# Patient Record
Sex: Female | Born: 1940 | Race: Black or African American | Hispanic: No | Marital: Single | Smoking: Never smoker
Health system: Southern US, Community
[De-identification: ages and names within clinical notes are randomized; demographics above are authoritative.]

## PROBLEM LIST (undated history)

## (undated) DIAGNOSIS — K219 Gastro-esophageal reflux disease without esophagitis: Secondary | ICD-10-CM

## (undated) DIAGNOSIS — R350 Frequency of micturition: Secondary | ICD-10-CM

## (undated) DIAGNOSIS — R1310 Dysphagia, unspecified: Secondary | ICD-10-CM

## (undated) DIAGNOSIS — I10 Essential (primary) hypertension: Secondary | ICD-10-CM

## (undated) DIAGNOSIS — M545 Low back pain: Secondary | ICD-10-CM

## (undated) DIAGNOSIS — G8929 Other chronic pain: Secondary | ICD-10-CM

## (undated) DIAGNOSIS — M1990 Unspecified osteoarthritis, unspecified site: Secondary | ICD-10-CM

## (undated) DIAGNOSIS — E785 Hyperlipidemia, unspecified: Secondary | ICD-10-CM

## (undated) DIAGNOSIS — R030 Elevated blood-pressure reading, without diagnosis of hypertension: Secondary | ICD-10-CM

## (undated) DIAGNOSIS — G4700 Insomnia, unspecified: Secondary | ICD-10-CM

## (undated) DIAGNOSIS — D251 Intramural leiomyoma of uterus: Secondary | ICD-10-CM

## (undated) DIAGNOSIS — M549 Dorsalgia, unspecified: Secondary | ICD-10-CM

## (undated) HISTORY — DX: Elevated blood-pressure reading, without diagnosis of hypertension: R03.0

## (undated) HISTORY — DX: Hyperlipidemia, unspecified: E78.5

## (undated) HISTORY — DX: Other chronic pain: G89.29

## (undated) HISTORY — DX: Low back pain: M54.5

## (undated) HISTORY — DX: Dorsalgia, unspecified: M54.9

## (undated) HISTORY — DX: Frequency of micturition: R35.0

## (undated) HISTORY — DX: Gastro-esophageal reflux disease without esophagitis: K21.9

## (undated) HISTORY — DX: Intramural leiomyoma of uterus: D25.1

## (undated) HISTORY — DX: Insomnia, unspecified: G47.00

## (undated) HISTORY — PX: TUBAL LIGATION: SHX77

## (undated) HISTORY — DX: Dysphagia, unspecified: R13.10

---

## 2004-12-21 ENCOUNTER — Ambulatory Visit: Payer: Self-pay

## 2007-07-11 ENCOUNTER — Emergency Department: Payer: Self-pay | Admitting: Emergency Medicine

## 2007-07-11 ENCOUNTER — Other Ambulatory Visit: Payer: Self-pay

## 2008-07-01 ENCOUNTER — Ambulatory Visit: Payer: Self-pay | Admitting: Unknown Physician Specialty

## 2010-01-05 ENCOUNTER — Ambulatory Visit: Payer: Self-pay | Admitting: Family Medicine

## 2011-02-02 ENCOUNTER — Ambulatory Visit: Payer: Self-pay | Admitting: Family Medicine

## 2011-05-17 LAB — HM PAP SMEAR

## 2012-04-24 ENCOUNTER — Ambulatory Visit: Payer: Self-pay | Admitting: Family Medicine

## 2013-07-16 LAB — HM MAMMOGRAPHY

## 2013-07-22 ENCOUNTER — Ambulatory Visit: Payer: Self-pay | Admitting: Family Medicine

## 2013-08-02 ENCOUNTER — Ambulatory Visit: Payer: Self-pay | Admitting: Family Medicine

## 2014-02-12 ENCOUNTER — Ambulatory Visit: Payer: Self-pay | Admitting: Family Medicine

## 2014-04-01 ENCOUNTER — Ambulatory Visit: Payer: Self-pay | Admitting: Urology

## 2014-08-18 ENCOUNTER — Encounter: Payer: Self-pay | Admitting: Family Medicine

## 2014-10-19 DIAGNOSIS — M545 Low back pain, unspecified: Secondary | ICD-10-CM | POA: Insufficient documentation

## 2014-10-19 DIAGNOSIS — G8929 Other chronic pain: Secondary | ICD-10-CM

## 2014-10-19 DIAGNOSIS — Z789 Other specified health status: Secondary | ICD-10-CM | POA: Insufficient documentation

## 2014-10-19 DIAGNOSIS — K219 Gastro-esophageal reflux disease without esophagitis: Secondary | ICD-10-CM | POA: Insufficient documentation

## 2014-10-19 DIAGNOSIS — D251 Intramural leiomyoma of uterus: Secondary | ICD-10-CM

## 2014-10-19 DIAGNOSIS — G4700 Insomnia, unspecified: Secondary | ICD-10-CM

## 2014-10-19 DIAGNOSIS — R350 Frequency of micturition: Secondary | ICD-10-CM | POA: Insufficient documentation

## 2014-10-19 DIAGNOSIS — M549 Dorsalgia, unspecified: Secondary | ICD-10-CM

## 2014-10-19 DIAGNOSIS — R319 Hematuria, unspecified: Secondary | ICD-10-CM | POA: Insufficient documentation

## 2014-10-19 HISTORY — DX: Intramural leiomyoma of uterus: D25.1

## 2014-10-19 HISTORY — DX: Gastro-esophageal reflux disease without esophagitis: K21.9

## 2014-10-19 HISTORY — DX: Frequency of micturition: R35.0

## 2014-10-19 HISTORY — DX: Low back pain, unspecified: M54.50

## 2014-10-19 HISTORY — DX: Insomnia, unspecified: G47.00

## 2014-10-19 HISTORY — DX: Other chronic pain: G89.29

## 2014-10-19 HISTORY — DX: Dorsalgia, unspecified: M54.9

## 2014-10-20 ENCOUNTER — Telehealth: Payer: Self-pay | Admitting: Family Medicine

## 2014-10-20 ENCOUNTER — Ambulatory Visit (INDEPENDENT_AMBULATORY_CARE_PROVIDER_SITE_OTHER): Payer: Commercial Managed Care - HMO | Admitting: Family Medicine

## 2014-10-20 ENCOUNTER — Encounter: Payer: Self-pay | Admitting: Family Medicine

## 2014-10-20 VITALS — BP 145/85 | HR 62 | Temp 98.3°F | Resp 16 | Ht <= 58 in | Wt 171.8 lb

## 2014-10-20 DIAGNOSIS — IMO0001 Reserved for inherently not codable concepts without codable children: Secondary | ICD-10-CM

## 2014-10-20 DIAGNOSIS — R1310 Dysphagia, unspecified: Secondary | ICD-10-CM

## 2014-10-20 DIAGNOSIS — M549 Dorsalgia, unspecified: Secondary | ICD-10-CM

## 2014-10-20 DIAGNOSIS — K2100 Gastro-esophageal reflux disease with esophagitis, without bleeding: Secondary | ICD-10-CM

## 2014-10-20 DIAGNOSIS — R030 Elevated blood-pressure reading, without diagnosis of hypertension: Secondary | ICD-10-CM

## 2014-10-20 DIAGNOSIS — G8929 Other chronic pain: Secondary | ICD-10-CM | POA: Diagnosis not present

## 2014-10-20 HISTORY — DX: Dysphagia, unspecified: R13.10

## 2014-10-20 HISTORY — DX: Reserved for inherently not codable concepts without codable children: IMO0001

## 2014-10-20 MED ORDER — OMEPRAZOLE 40 MG PO CPDR: 40.0000 mg | DELAYED_RELEASE_CAPSULE | Freq: Every morning | ORAL | Status: DC

## 2014-10-20 NOTE — Progress Notes (Signed)
Name: Tracey Carroll   MRN: [id number]    DOB: [birth year]   Date:10/20/2014       Progress Note  Subjective  Chief Complaint  Chief Complaint  Patient presents with  . Gastrophageal Reflux    Unchanged  . Dysphagia    HPI   Hx. olf GERD.  Rouble swaowing solid foods for past 2-3 weeks.  Has to chew food more slowly and thoroughly.  Hx. Of chronic back pain.  Takes Ibuprofen about once a week./    No problem-specific assessment & plan notes found for this encounter.   Past Medical History  Diagnosis Date  . GERD (gastroesophageal reflux disease)     Past Surgical History  Procedure Laterality Date  . Tubal ligation      Family History  Problem Relation Age of Onset  . Diabetes Father     in her 78 and 31s  . Hypertension Daughter     her 58s    History   Social History  . Marital Status: Single    Spouse Name: N/A  . Number of Children: N/A  . Years of Education: N/A   Occupational History  . Not on file.   Social History Main Topics  . Smoking status: Never Smoker   . Smokeless tobacco: Never Used  . Alcohol Use: No  . Drug Use: No  . Sexual Activity: Not on file   Other Topics Concern  . Not on file   Social History Narrative     Current outpatient prescriptions:  .  omeprazole (PRILOSEC) 40 MG capsule, Take 1 capsule (40 mg total) by mouth every morning., Disp: 30 capsule, Rfl: 12  No Known Allergies   Review of Systems  Constitutional: Negative.   HENT: Negative.   Respiratory: Negative.   Cardiovascular: Negative.   Gastrointestinal: Positive for heartburn (reflux after eating at times). Negative for nausea, vomiting and melena.  Musculoskeletal: Positive for back pain (chronic).  Skin: Negative.   Neurological: Negative.       Objective  Filed Vitals:   10/20/14 0847 10/20/14 0921  BP: 144/74 145/85  Pulse: 62   Temp: 98.3 F (36.8 C)   Resp: 16   Height: 4\' 9"  (1.448 m)   Weight: 171 lb 12.8 oz (77.928 kg)      Physical Exam  Constitutional: She is well-developed, well-nourished, and in no distress.  HENT:  Mouth/Throat: Oropharynx is clear and moist.  Eyes: Conjunctivae and EOM are normal. Pupils are equal, round, and reactive to light.  Neck: Normal range of motion. Neck supple. No thyromegaly present.  Cardiovascular: Normal rate, regular rhythm, normal heart sounds and intact distal pulses.  Exam reveals no gallop and no friction rub.   No murmur heard. Pulmonary/Chest: Effort normal and breath sounds normal. No respiratory distress. She has no wheezes. She has no rales.  Abdominal: Soft. She exhibits no distension and no mass. There is no tenderness. There is no rebound and no guarding.  Musculoskeletal: Normal range of motion. She exhibits no edema.  Lymphadenopathy:    She has no cervical adenopathy.       No results found for this or any previous visit (from the past 2160 hour(s)).   Assessment & Plan  Problem List Items Addressed This Visit      Digestive   Acid reflux - Primary   Relevant Medications   omeprazole (PRILOSEC) 40 MG capsule   Dysphagia   Relevant Orders   Ambulatory referral to Gastroenterology  Other   Back pain, chronic   Elevated BP     .diagmeds Meds ordered this encounter  Medications  . omeprazole (PRILOSEC) 40 MG capsule    Sig: Take 1 capsule (40 mg total) by mouth every morning.    Dispense:  30 capsule    Refill:  12    1. Gastroesophageal reflux disease with esophagitis Continue Omeprazole  2. Dysphagia  Refer Dr. Allen Norris at [facility]. 3. Back pain, chronic Discontinue Ibuprofen 4. Elevated BP Will follow at subsequent visit.  Reduce \\salt  in diet.   Meds ordered this encounter  Medications  . omeprazole (PRILOSEC) 40 MG capsule    Sig: Take 1 capsule (40 mg total) by mouth every morning.    Dispense:  30 capsule    Refill:  12

## 2014-10-22 ENCOUNTER — Encounter: Payer: Self-pay | Admitting: Family Medicine

## 2014-10-22 ENCOUNTER — Encounter: Payer: Self-pay | Admitting: Gastroenterology

## 2014-10-28 ENCOUNTER — Ambulatory Visit: Payer: Commercial Managed Care - HMO | Admitting: Urgent Care

## 2014-10-30 ENCOUNTER — Ambulatory Visit (INDEPENDENT_AMBULATORY_CARE_PROVIDER_SITE_OTHER): Payer: Commercial Managed Care - HMO | Admitting: Gastroenterology

## 2014-10-30 ENCOUNTER — Other Ambulatory Visit: Payer: Self-pay

## 2014-10-30 VITALS — BP 159/92 | HR 68 | Temp 98.4°F | Ht <= 58 in | Wt 172.0 lb

## 2014-10-30 DIAGNOSIS — R1310 Dysphagia, unspecified: Secondary | ICD-10-CM | POA: Diagnosis not present

## 2014-10-30 NOTE — Progress Notes (Signed)
Gastroenterology Consultation  Referring Provider:     Arlis Porta., MD Primary Care Physician:  Dicky Doe, MD Primary Gastroenterologist:  Dr. Allen Norris     Reason for Consultation:     Dysphagia        HPI:   Tracey Carroll is a 74 y.o. y/o female referred for consultation & management of dysphagia by Dr. Dicky Doe, MD.  This patient comes in today with a report of dysphagia. The patient states that her dysphagia started a few weeks ago. The symptoms happen more with solids than a day with liquids. The patient had had this in the past and had an upper endoscopy that appears to be having been done by Dr. Vira Agar. The patient states that she has lost weight since this has started. She is not sure how much weight he has been lost but she states that her close are fitting looser than they had been previously. No report of any black stools or bloody stools she also denies any change in bowel habits.  Past Medical History  Diagnosis Date  . GERD (gastroesophageal reflux disease)   . Acid reflux 10/19/2014  . Dysphagia 10/20/2014  . 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 Fibroids, intramural 10/19/2014  . Back pain, chronic 10/19/2014  . LBP (low back pain) 10/19/2014  . Elevated BP 10/20/2014  . FOM (frequency of micturition) 10/19/2014    Past Surgical History  Procedure Laterality Date  . Tubal ligation      Prior to Admission medications   Medication Sig Start Date End Date Taking? Authorizing Provider  omeprazole (PRILOSEC) 40 MG capsule Take 1 capsule (40 mg total) by mouth every morning. 10/20/14 04/26/16 Yes Arlis Porta., MD  ibuprofen (ADVIL,MOTRIN) 600 MG tablet Take 600 mg by mouth every 6 (six) hours as needed.    Historical Provider, MD  omeprazole (PRILOSEC) 40 MG capsule Take 40 mg by mouth daily.    Historical Provider, MD    Family History  Problem Relation Age of Onset  . Diabetes Father     in her 44 and 18s  . Hypertension Daughter     her 36s      History  Substance Use Topics  . Smoking status: Never Smoker   . Smokeless tobacco: Never Used  . Alcohol Use: No    Allergies as of 10/30/2014  . (No Known Allergies)    Review of Systems:    All systems reviewed and negative except where noted in HPI.   Physical Exam:  BP 159/92 mmHg  Pulse 68  Temp(Src) 98.4 F (36.9 C) (Oral)  Ht 4\' 10"  (1.473 m)  Wt 172 lb (78.019 kg)  BMI 35.96 kg/m2 No LMP recorded. Patient is postmenopausal. Psych:  Alert and cooperative. Normal mood and affect. General:   Alert,  Well-developed, well-nourished, pleasant and cooperative in NAD Head:  Normocephalic and atraumatic. Eyes:  Sclera clear, no icterus.   Conjunctiva pink. Ears:  Normal auditory acuity. Nose:  No deformity, discharge, or lesions. Mouth:  No deformity or lesions,oropharynx pink & moist. Neck:  Supple; no masses or thyromegaly. Lungs:  Respirations even and unlabored.  Clear throughout to auscultation.   No wheezes, crackles, or rhonchi. No acute distress. Heart:  Regular rate and rhythm; no murmurs, clicks, rubs, or gallops. Abdomen:  Normal bowel sounds.  No bruits.  Soft, non-tender and non-distended without masses, hepatosplenomegaly or hernias noted.  No guarding or rebound tenderness.  Negative Carnett sign.  Rectal:  Deferred.  Msk:  Symmetrical without gross deformities.  Good, equal movement & strength bilaterally. Pulses:  Normal pulses noted. Extremities:  No clubbing or edema.  No cyanosis. Neurologic:  Alert and oriented x3;  grossly normal neurologically. Skin:  Intact without significant lesions or rashes.  No jaundice. Lymph Nodes:  No significant cervical adenopathy. Psych:  Alert and cooperative. Normal mood and affect.  Imaging Studies: No results found.  Assessment and Plan:   Tracey Carroll is a 74 y.o. y/o female dysphasia who will be set up for an upper endoscopy to evaluate her esophagus for possible strictures or narrowing versus  neoplasia. The patient may need esophageal dilation earache.I have discussed risks & benefits which include, but are not limited to, bleeding, infection, perforation & drug reaction.  The patient agrees with this plan & written consent will be obtained.

## 2014-11-12 ENCOUNTER — Encounter: Payer: Self-pay | Admitting: *Deleted

## 2014-11-19 NOTE — Discharge Instructions (Signed)

## 2014-11-20 ENCOUNTER — Ambulatory Visit: Payer: Commercial Managed Care - HMO | Admitting: Anesthesiology

## 2014-11-20 ENCOUNTER — Encounter
Admission: RE | Disposition: A | Discharge status: Home / Self Care | Payer: Self-pay | Source: Ambulatory Visit | Attending: Gastroenterology

## 2014-11-20 ENCOUNTER — Other Ambulatory Visit: Payer: Self-pay | Admitting: Gastroenterology

## 2014-11-20 ENCOUNTER — Ambulatory Visit
Admission: RE | Admit: 2014-11-20 | Discharge: 2014-11-20 | Disposition: A | Discharge status: Home / Self Care | Payer: Commercial Managed Care - HMO | Source: Ambulatory Visit | Attending: Gastroenterology | Admitting: Gastroenterology

## 2014-11-20 DIAGNOSIS — K219 Gastro-esophageal reflux disease without esophagitis: Secondary | ICD-10-CM | POA: Insufficient documentation

## 2014-11-20 DIAGNOSIS — Z79899 Other long term (current) drug therapy: Secondary | ICD-10-CM | POA: Diagnosis not present

## 2014-11-20 DIAGNOSIS — R1310 Dysphagia, unspecified: Secondary | ICD-10-CM | POA: Insufficient documentation

## 2014-11-20 DIAGNOSIS — K449 Diaphragmatic hernia without obstruction or gangrene: Secondary | ICD-10-CM | POA: Insufficient documentation

## 2014-11-20 DIAGNOSIS — K222 Esophageal obstruction: Secondary | ICD-10-CM | POA: Insufficient documentation

## 2014-11-20 DIAGNOSIS — R030 Elevated blood-pressure reading, without diagnosis of hypertension: Secondary | ICD-10-CM | POA: Insufficient documentation

## 2014-11-20 DIAGNOSIS — R69 Illness, unspecified: Secondary | ICD-10-CM | POA: Diagnosis not present

## 2014-11-20 DIAGNOSIS — Z791 Long term (current) use of non-steroidal anti-inflammatories (NSAID): Secondary | ICD-10-CM | POA: Insufficient documentation

## 2014-11-20 HISTORY — PX: ESOPHAGOGASTRODUODENOSCOPY (EGD) WITH PROPOFOL: SHX5813

## 2014-11-20 HISTORY — DX: Unspecified osteoarthritis, unspecified site: M19.90

## 2014-11-20 SURGERY — ESOPHAGOGASTRODUODENOSCOPY (EGD) WITH PROPOFOL
Anesthesia: Monitor Anesthesia Care | Wound class: Clean Contaminated

## 2014-11-20 MED ORDER — OXYCODONE HCL 5 MG/5ML PO SOLN: 5.0000 mg | Freq: Once | ORAL | Status: DC | PRN

## 2014-11-20 MED ORDER — LABETALOL HCL 5 MG/ML IV SOLN
INTRAVENOUS | Status: DC | PRN
  Administered 2014-11-20: 5 mg via INTRAVENOUS

## 2014-11-20 MED ORDER — OXYCODONE HCL 5 MG PO TABS: 5.0000 mg | ORAL_TABLET | Freq: Once | ORAL | Status: DC | PRN

## 2014-11-20 MED ORDER — SODIUM CHLORIDE 0.9 % IV SOLN: INTRAVENOUS | Status: DC

## 2014-11-20 MED ORDER — ACETAMINOPHEN 160 MG/5ML PO SOLN: 325.0000 mg | ORAL | Status: DC | PRN

## 2014-11-20 MED ORDER — GLYCOPYRROLATE 0.2 MG/ML IJ SOLN
INTRAMUSCULAR | Status: DC | PRN
  Administered 2014-11-20: .1 mg via INTRAVENOUS

## 2014-11-20 MED ORDER — FENTANYL CITRATE (PF) 100 MCG/2ML IJ SOLN: 25.0000 ug | INTRAMUSCULAR | Status: DC | PRN

## 2014-11-20 MED ORDER — ACETAMINOPHEN 325 MG PO TABS: 325.0000 mg | ORAL_TABLET | ORAL | Status: DC | PRN

## 2014-11-20 MED ORDER — LIDOCAINE HCL (CARDIAC) 20 MG/ML IV SOLN
INTRAVENOUS | Status: DC | PRN
  Administered 2014-11-20: 50 mg via INTRAVENOUS

## 2014-11-20 MED ORDER — LACTATED RINGERS IV SOLN
INTRAVENOUS | Status: DC
  Administered 2014-11-20: 07:00:00 via INTRAVENOUS

## 2014-11-20 MED ORDER — DEXAMETHASONE SODIUM PHOSPHATE 4 MG/ML IJ SOLN: 8.0000 mg | Freq: Once | INTRAMUSCULAR | Status: DC | PRN

## 2014-11-20 MED ORDER — PROPOFOL 10 MG/ML IV BOLUS
INTRAVENOUS | Status: DC | PRN
  Administered 2014-11-20: 50 mg via INTRAVENOUS
  Administered 2014-11-20 (×2): 40 mg via INTRAVENOUS

## 2014-11-20 SURGICAL SUPPLY — 39 items

## 2014-11-20 NOTE — H&P (Signed)
  [facility]  [address], [place], [place] [address] Phone: [phone] Fax : [phone]  Primary Care Physician:  Dicky Doe, MD Primary Gastroenterologist:  Dr. Allen Norris  Pre-Procedure History & Physical: HPI:  Tracey Carroll is a 74 y.o. female is here for an endoscopy.   Past Medical History  Diagnosis Date  . GERD (gastroesophageal reflux disease)   . Acid reflux 10/19/2014  . Dysphagia 10/20/2014  . 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 Fibroids, intramural 10/19/2014  . Back pain, chronic 10/19/2014  . LBP (low back pain) 10/19/2014  . Elevated BP 10/20/2014  . FOM (frequency of micturition) 10/19/2014  . Arthritis     arms    Past Surgical History  Procedure Laterality Date  . Tubal ligation      Prior to Admission medications   Medication Sig Start Date End Date Taking? Authorizing Provider  omeprazole (PRILOSEC) 40 MG capsule Take 1 capsule (40 mg total) by mouth every morning. 10/20/14 04/26/16 Yes Arlis Porta., MD  ibuprofen (ADVIL,MOTRIN) 600 MG tablet Take 600 mg by mouth every 6 (six) hours as needed.    Historical Provider, MD  omeprazole (PRILOSEC) 40 MG capsule Take 40 mg by mouth daily.    Historical Provider, MD    Allergies as of 10/30/2014  . (No Known Allergies)    Family History  Problem Relation Age of Onset  . Diabetes Father     in her 66 and 67s  . Hypertension Daughter     her 26s    History   Social History  . Marital Status: Single    Spouse Name: N/A  . Number of Children: N/A  . Years of Education: N/A   Occupational History  . Not on file.   Social History Main Topics  . Smoking status: Never Smoker   . Smokeless tobacco: Never Used  . Alcohol Use: No  . Drug Use: No  . Sexual Activity: Not on file   Other Topics Concern  . Not on file   Social History Narrative   ** Merged History Encounter **        Review of Systems: See HPI, otherwise negative ROS  Physical Exam: BP 186/89 mmHg   Pulse 77  Temp(Src) 97.7 F (36.5 C) (Temporal)  Resp 16  Ht 4\' 10"  (1.473 m)  Wt 171 lb (77.565 kg)  BMI 35.75 kg/m2  SpO2 100% General:   Alert,  pleasant and cooperative in NAD Head:  Normocephalic and atraumatic. Neck:  Supple; no masses or thyromegaly. Lungs:  Clear throughout to auscultation.    Heart:  Regular rate and rhythm. Abdomen:  Soft, nontender and nondistended. Normal bowel sounds, without guarding, and without rebound.   Neurologic:  Alert and  oriented x4;  grossly normal neurologically.  Impression/Plan: Tracey Carroll is here for an endoscopy to be performed for dysphagia  Risks, benefits, limitations, and alternatives regarding  endoscopy have been reviewed with the patient.  Questions have been answered.  All parties agreeable.   Ollen Bowl, MD  11/20/2014, 7:20 AM

## 2014-11-20 NOTE — Anesthesia Procedure Notes (Signed)
Procedure Name: MAC Performed by: Kristine Chahal Pre-anesthesia Checklist: Patient identified, Emergency Drugs available, Suction available, Timeout performed and Patient being monitored Patient Re-evaluated:Patient Re-evaluated prior to inductionOxygen Delivery Method: Nasal cannula Placement Confirmation: positive ETCO2       

## 2014-11-20 NOTE — Anesthesia Preprocedure Evaluation (Signed)
Anesthesia Evaluation  Patient identified by MRN, date of birth, ID band Patient awake    Reviewed: Allergy & Precautions, H&P , NPO status , Patient's Chart, lab work & pertinent test results, reviewed documented beta blocker date and time   Airway Mallampati: III  TM Distance: >3 FB Neck ROM: full    Dental no notable dental hx.    Pulmonary neg pulmonary ROS,  breath sounds clear to auscultation  Pulmonary exam normal       Cardiovascular Exercise Tolerance: Good negative cardio ROS  Rhythm:regular Rate:Normal     Neuro/Psych negative neurological ROS  negative psych ROS   GI/Hepatic Neg liver ROS, GERD-  Medicated,  Endo/Other  negative endocrine ROS  Renal/GU negative Renal ROS  negative genitourinary   Musculoskeletal   Abdominal   Peds  Hematology negative hematology ROS (+)   Anesthesia Other Findings   Reproductive/Obstetrics negative OB ROS                             Anesthesia Physical Anesthesia Plan  ASA: II  Anesthesia Plan: MAC   Post-op Pain Management:    Induction:   Airway Management Planned:   Additional Equipment:   Intra-op Plan:   Post-operative Plan:   Informed Consent: I have reviewed the patients History and Physical, chart, labs and discussed the procedure including the risks, benefits and alternatives for the proposed anesthesia with the patient or authorized representative who has indicated his/her understanding and acceptance.     Plan Discussed with: CRNA  Anesthesia Plan Comments:         Anesthesia Quick Evaluation

## 2014-11-20 NOTE — Anesthesia Postprocedure Evaluation (Signed)
  Anesthesia Post-op Note  Patient: Tracey Carroll  Procedure(s) Performed: Procedure(s): ESOPHAGOGASTRODUODENOSCOPY (EGD) WITH PROPOFOL with dilation (N/A)  Anesthesia type:MAC  Patient location: PACU  Post pain: Pain level controlled  Post assessment: Post-op Vital signs reviewed, Patient's Cardiovascular Status Stable, Respiratory Function Stable, Patent Airway and No signs of Nausea or vomiting  Post vital signs: Reviewed and stable  Last Vitals:  Filed Vitals:   11/20/14 0746  BP:   Pulse: 86  Temp: 36.5 C  Resp: 21    Level of consciousness: awake, alert  and patient cooperative  Complications: No apparent anesthesia complications

## 2014-11-20 NOTE — Op Note (Signed)
[facility] Gastroenterology Patient Name: Tracey Carroll Procedure Date: 11/20/2014 7:30 AM MRN: [id number] Account #: [id number] Date of Birth: 1940/08/03 Admit Type: Outpatient Age: 74 Room: [facility] OR ROOM 01 Gender: Female Note Status: Finalized Procedure:         Upper GI endoscopy Indications:       Dysphagia Providers:         Lucilla Lame, MD Medicines:         Propofol per Anesthesia Complications:     No immediate complications. Procedure:         Pre-Anesthesia Assessment:                    - Prior to the procedure, a History and Physical was                     performed, and patient medications and allergies were                     reviewed. The patient's tolerance of previous anesthesia                     was also reviewed. The risks and benefits of the procedure                     and the sedation options and risks were discussed with the                     patient. All questions were answered, and informed consent                     was obtained. Prior Anticoagulants: The patient has taken                     no previous anticoagulant or antiplatelet agents. ASA                     Grade Assessment: II - A patient with mild systemic                     disease. After reviewing the risks and benefits, the                     patient was deemed in satisfactory condition to undergo                     the procedure.                    After obtaining informed consent, the endoscope was passed                     under direct vision. Throughout the procedure, the                     patient's blood pressure, pulse, and oxygen saturations                     were monitored continuously. The Olympus GIF-[id number]                     Endoscope (S#. [id number]) was introduced through the mouth,                     and advanced to the second part of duodenum. The upper GI  endoscopy was accomplished without difficulty. The patient           tolerated the procedure well. Findings:      A small hiatus hernia was present.      A benign-appearing, intrinsic mild stenosis was found in the upper third       of the esophagus and was traversed. The scope was withdrawn. Dilation       was performed with a Maloney dilator with no resistance at 50 Fr. The       dilation site was examined following endoscope reinsertion and showed       complete resolution of luminal narrowing.      Biopsies were taken with a cold forceps in the middle third of the       esophagus for histology.      The stomach was normal.      The examined duodenum was normal. Impression:        - Small hiatus hernia.                    - Benign-appearing esophageal stricture. Dilated.                    - Normal stomach.                    - Normal examined duodenum.                    - Biopsies were taken with a cold forceps for histology in                     the middle third of the esophagus. Recommendation:    - Await pathology results. Procedure Code(s): --- Professional ---                    [phone], Esophagogastroduodenoscopy, flexible, transoral;                     with biopsy, single or multiple                    43450, Dilation of esophagus, by unguided sound or bougie,                     single or multiple passes Diagnosis Code(s): --- Professional ---                    R13.10, Dysphagia, unspecified                    K44.9, Diaphragmatic hernia without obstruction or gangrene                    K22.2, Esophageal obstruction CPT copyright 2014 American Medical Association. All rights reserved. The codes documented in this report are preliminary and upon coder review may  be revised to meet current compliance requirements. Lucilla Lame, MD 11/20/2014 7:41:34 AM This report has been signed electronically. Number of Addenda: 0 Note Initiated On: 11/20/2014 7:30 AM Total Procedure Duration: 0 hours 4 minutes 46 seconds       [facility]

## 2014-11-20 NOTE — Transfer of Care (Signed)
Immediate Anesthesia Transfer of Care Note  Patient: Tracey Carroll  Procedure(s) Performed: Procedure(s): ESOPHAGOGASTRODUODENOSCOPY (EGD) WITH PROPOFOL with dilation (N/A)  Patient Location: PACU  Anesthesia Type: MAC  Level of Consciousness: awake, alert  and patient cooperative  Airway and Oxygen Therapy: Patient Spontanous Breathing and Patient connected to supplemental oxygen  Post-op Assessment: Post-op Vital signs reviewed, Patient's Cardiovascular Status Stable, Respiratory Function Stable, Patent Airway and No signs of Nausea or vomiting  Post-op Vital Signs: Reviewed and stable  Complications: No apparent anesthesia complications

## 2014-11-21 ENCOUNTER — Encounter: Payer: Self-pay | Admitting: Gastroenterology

## 2014-11-24 ENCOUNTER — Encounter: Payer: Self-pay | Admitting: Gastroenterology

## 2014-12-04 ENCOUNTER — Ambulatory Visit: Payer: Commercial Managed Care - HMO | Admitting: Family Medicine

## 2014-12-19 ENCOUNTER — Encounter: Payer: Self-pay | Admitting: Family Medicine

## 2014-12-19 ENCOUNTER — Ambulatory Visit (INDEPENDENT_AMBULATORY_CARE_PROVIDER_SITE_OTHER): Payer: Commercial Managed Care - HMO | Admitting: Family Medicine

## 2014-12-19 VITALS — BP 160/90 | HR 69 | Temp 98.7°F | Resp 16 | Ht <= 58 in | Wt 176.2 lb

## 2014-12-19 DIAGNOSIS — E669 Obesity, unspecified: Secondary | ICD-10-CM | POA: Diagnosis not present

## 2014-12-19 DIAGNOSIS — K2100 Gastro-esophageal reflux disease with esophagitis, without bleeding: Secondary | ICD-10-CM

## 2014-12-19 DIAGNOSIS — IMO0001 Reserved for inherently not codable concepts without codable children: Secondary | ICD-10-CM

## 2014-12-19 DIAGNOSIS — K222 Esophageal obstruction: Secondary | ICD-10-CM | POA: Diagnosis not present

## 2014-12-19 DIAGNOSIS — N3001 Acute cystitis with hematuria: Secondary | ICD-10-CM | POA: Diagnosis not present

## 2014-12-19 DIAGNOSIS — R030 Elevated blood-pressure reading, without diagnosis of hypertension: Secondary | ICD-10-CM | POA: Diagnosis not present

## 2014-12-19 DIAGNOSIS — R300 Dysuria: Secondary | ICD-10-CM

## 2014-12-19 LAB — POCT URINALYSIS DIPSTICK
Bilirubin, UA: NEGATIVE
Glucose, UA: NEGATIVE
Ketones, UA: NEGATIVE
NITRITE UA: NEGATIVE
Protein, UA: NEGATIVE
SPEC GRAV UA: 1.015
Urobilinogen, UA: 0.2
pH, UA: 5

## 2014-12-19 MED ORDER — LISINOPRIL 5 MG PO TABS: 5.0000 mg | ORAL_TABLET | Freq: Every day | ORAL | Status: DC

## 2014-12-19 MED ORDER — CEPHALEXIN 500 MG PO CAPS: 500.0000 mg | ORAL_CAPSULE | Freq: Four times a day (QID) | ORAL | Status: AC

## 2014-12-19 NOTE — Patient Instructions (Addendum)
Discussed bone density study (DEXA) with patient and she declines at this time.  She should avoid NSAIDs b/o BP and GI strictures.

## 2014-12-19 NOTE — Addendum Note (Signed)
Addended by: Theresia Majors A on: 12/19/2014 11:46 AM   Modules accepted: Miquel Dunn

## 2014-12-19 NOTE — Progress Notes (Signed)
Name: Tracey Carroll   MRN: [id number]    DOB: Sep 29, 1940   Date:12/19/2014       Progress Note  Subjective  Chief Complaint  Chief Complaint  Patient presents with  . Hypertension    HPI  For f/u of elevated BP.  S/p EGD with esophageal dilatation.  No longer taking NSAIDs.  No problems at this time.     Past Medical History  Diagnosis Date  . GERD (gastroesophageal reflux disease)   . Acid reflux 10/19/2014  . Dysphagia 10/20/2014  . 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 Fibroids, intramural 10/19/2014  . Back pain, chronic 10/19/2014  . LBP (low back pain) 10/19/2014  . Elevated BP 10/20/2014  . FOM (frequency of micturition) 10/19/2014  . Arthritis     arms    History  Substance Use Topics  . Smoking status: Never Smoker   . Smokeless tobacco: Never Used  . Alcohol Use: No     Current outpatient prescriptions:  .  ibuprofen (ADVIL,MOTRIN) 600 MG tablet, Take 600 mg by mouth every 6 (six) hours as needed., Disp: , Rfl:  .  omeprazole (PRILOSEC) 40 MG capsule, Take 1 capsule (40 mg total) by mouth every morning., Disp: 30 capsule, Rfl: 12  No Known Allergies  Review of Systems  Constitutional: Negative for fever, chills, weight loss and malaise/fatigue.  Eyes: Negative for blurred vision and double vision.  Respiratory: Negative for cough, sputum production, shortness of breath and wheezing.   Cardiovascular: Negative for chest pain, palpitations, orthopnea and leg swelling.  Gastrointestinal: Positive for nausea. Negative for heartburn, vomiting, abdominal pain, diarrhea and blood in stool.  Genitourinary: Positive for dysuria. Negative for urgency and frequency.  Musculoskeletal: Negative for myalgias and joint pain.  Skin: Negative for rash.  Neurological: Positive for headaches. Negative for dizziness, tingling, sensory change and focal weakness.  Psychiatric/Behavioral: Negative for depression. The patient is not nervous/anxious.       Objective  Filed Vitals:   12/19/14 0814  BP: 148/85  Pulse: 69  Temp: 98.7 F (37.1 C)  Resp: 16  Height: 4\' 9"  (9.937 m)  Weight: 176 lb 3.2 oz (79.924 kg)     Physical Exam  Constitutional: She is well-developed, well-nourished, and in no distress. No distress.  HENT:  Head: Normocephalic and atraumatic.  Eyes: Conjunctivae and EOM are normal. Pupils are equal, round, and reactive to light. No scleral icterus.  Neck: Normal range of motion. Neck supple. No thyromegaly present.  Cardiovascular: Normal rate, regular rhythm, normal heart sounds and intact distal pulses.  Exam reveals no gallop and no friction rub.   No murmur heard. Pulmonary/Chest: Effort normal and breath sounds normal. No respiratory distress. She has no wheezes. She has no rales.  Abdominal: Soft. Bowel sounds are normal. She exhibits no distension and no mass. There is no tenderness.  Musculoskeletal: She exhibits no edema.  Lymphadenopathy:    She has no cervical adenopathy.  Vitals reviewed.     No results found for this or any previous visit (from the past 2160 hour(s)).   Assessment & Plan  There are no diagnoses linked to this encounter.  1. Elevated BP  - lisinopril (PRINIVIL,ZESTRIL) 5 MG tablet; Take 1 tablet (5 mg total) by mouth daily.  Dispense: 30 tablet; Refill: 6  2. Stricture and stenosis of esophagus   3. Gastroesophageal reflux disease with esophagitis   4. Obesity   5. Dysuria  - POCT Urinalysis Dipstick - CULTURE, URINE COMPREHENSIVE  6. Acute cystitis  with hematuria  - cephALEXin (KEFLEX) 500 MG capsule; Take 1 capsule (500 mg total) by mouth 4 (four) times daily.  Dispense: 28 capsule; Refill: 0

## 2014-12-21 LAB — CULTURE, URINE COMPREHENSIVE

## 2015-01-22 ENCOUNTER — Encounter: Payer: Self-pay | Admitting: Family Medicine

## 2015-01-22 ENCOUNTER — Ambulatory Visit (INDEPENDENT_AMBULATORY_CARE_PROVIDER_SITE_OTHER): Payer: Commercial Managed Care - HMO | Admitting: Family Medicine

## 2015-01-22 VITALS — BP 160/80 | HR 65 | Temp 98.2°F | Resp 16 | Ht <= 58 in | Wt 178.0 lb

## 2015-01-22 DIAGNOSIS — I10 Essential (primary) hypertension: Secondary | ICD-10-CM | POA: Diagnosis not present

## 2015-01-22 DIAGNOSIS — K219 Gastro-esophageal reflux disease without esophagitis: Secondary | ICD-10-CM

## 2015-01-22 DIAGNOSIS — Z23 Encounter for immunization: Secondary | ICD-10-CM | POA: Diagnosis not present

## 2015-01-22 MED ORDER — LISINOPRIL 10 MG PO TABS: 10.0000 mg | ORAL_TABLET | Freq: Every day | ORAL | Status: DC

## 2015-01-22 NOTE — Patient Instructions (Signed)
Take 2, 5 mg. Lisinoprils a day until bottle empty, then change to 1, 10 mg., tablet daily.

## 2015-01-22 NOTE — Progress Notes (Signed)
Name: Tracey Carroll   MRN: [id number]    DOB: Jun 24, 1940   Date:01/22/2015       Progress Note  Subjective  Chief Complaint  Chief Complaint  Patient presents with  . Hypertension    1 month follow up    HPI Here for f/u of HBP.  Feeling well overall.  No c/o.  Taking her meds.  No GERD sx as lomng as she takes her Omeprazole  No problem-specific assessment & plan notes found for this encounter.   Past Medical History  Diagnosis Date  . GERD (gastroesophageal reflux disease)   . Acid reflux 10/19/2014  . Dysphagia 10/20/2014  . 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 Fibroids, intramural 10/19/2014  . Back pain, chronic 10/19/2014  . LBP (low back pain) 10/19/2014  . Elevated BP 10/20/2014  . FOM (frequency of micturition) 10/19/2014  . Arthritis     arms    Social History  Substance Use Topics  . Smoking status: Never Smoker   . Smokeless tobacco: Never Used  . Alcohol Use: No     Current outpatient prescriptions:  .  lisinopril (PRINIVIL,ZESTRIL) 5 MG tablet, Take 1 tablet (5 mg total) by mouth daily., Disp: 30 tablet, Rfl: 6 .  omeprazole (PRILOSEC) 40 MG capsule, Take 1 capsule (40 mg total) by mouth every morning., Disp: 30 capsule, Rfl: 12  No Known Allergies  Review of Systems  Constitutional: Negative for fever, chills and malaise/fatigue.  HENT: Negative for hearing loss.   Eyes: Negative for blurred vision and double vision.  Respiratory: Negative for cough, shortness of breath and wheezing.   Cardiovascular: Negative for chest pain, palpitations, orthopnea and leg swelling.  Gastrointestinal: Negative for heartburn, nausea, vomiting, abdominal pain, diarrhea and blood in stool.  Genitourinary: Positive for frequency. Negative for dysuria and urgency.  Musculoskeletal: Negative for myalgias and joint pain.  Skin: Negative for rash.  Neurological: Negative for dizziness, sensory change, focal weakness, weakness and headaches.      Objective  Filed Vitals:   01/22/15 0806  BP: 152/87  Pulse: 65  Temp: 98.2 F (36.8 C)  TempSrc: Oral  Resp: 16  Height: 4\' 9"  (1.448 m)  Weight: 178 lb (80.74 kg)     Physical Exam  Constitutional: She is oriented to person, place, and time and well-developed, well-nourished, and in no distress. No distress.  HENT:  Head: Normocephalic and atraumatic.  Eyes: Conjunctivae and EOM are normal. Pupils are equal, round, and reactive to light. No scleral icterus.  Neck: Normal range of motion. Neck supple. Carotid bruit is not present. No thyromegaly present.  Cardiovascular: Normal rate, normal heart sounds and intact distal pulses.  Exam reveals no gallop and no friction rub.   No murmur heard. Pulmonary/Chest: Effort normal and breath sounds normal. No respiratory distress. She has no wheezes. She has no rales.  Abdominal: Soft. Bowel sounds are normal. She exhibits no distension, no abdominal bruit and no mass. There is no tenderness.  Musculoskeletal: She exhibits no edema.  Lymphadenopathy:    She has no cervical adenopathy.  Neurological: She is alert and oriented to person, place, and time.  Vitals reviewed.     Recent Results (from the past 2160 hour(s))  CULTURE, URINE COMPREHENSIVE     Status: None   Collection Time: 12/19/14 12:00 AM  Result Value Ref Range   Urine Culture, Comprehensive Final report    Result 1 Comment     Comment: Mixed urogenital flora Greater than 100,000 colony forming units  per mL   POCT Urinalysis Dipstick     Status: Abnormal   Collection Time: 12/19/14  8:50 AM  Result Value Ref Range   Color, UA STRAW    Clarity, UA CLOUDY    Glucose, UA NEG    Bilirubin, UA NEG    Ketones, UA NEG    Spec Grav, UA 1.015    Blood, UA SMALL    pH, UA 5.0    Protein, UA NEG    Urobilinogen, UA 0.2    Nitrite, UA NEG    Leukocytes, UA small (1+) (A) Negative     Assessment & Plan  1. Essential hypertension  - lisinopril (PRINIVIL,ZESTRIL) 10 MG tablet; Take 1 tablet  (10 mg total) by mouth daily.  Dispense: 30 tablet; Refill: 6  2. Gastroesophageal reflux disease without esophagitis   3. Need for influenza vaccination  - Flu vaccine HIGH DOSE PF (Fluzone High dose)

## 2015-02-17 ENCOUNTER — Telehealth: Payer: Self-pay | Admitting: Family Medicine

## 2015-02-17 DIAGNOSIS — H5210 Myopia, unspecified eye: Secondary | ICD-10-CM | POA: Diagnosis not present

## 2015-02-17 DIAGNOSIS — H524 Presbyopia: Secondary | ICD-10-CM | POA: Diagnosis not present

## 2015-02-17 NOTE — Telephone Encounter (Signed)
Completed.Tracey Carroll

## 2015-02-17 NOTE — Telephone Encounter (Signed)
Tammy at [facility] needs a referral for pt's annual eye exam H25.093.  Pt is seeing Dr. Lorie Apley today

## 2015-03-25 ENCOUNTER — Ambulatory Visit (INDEPENDENT_AMBULATORY_CARE_PROVIDER_SITE_OTHER): Payer: Commercial Managed Care - HMO | Admitting: Family Medicine

## 2015-03-25 ENCOUNTER — Encounter: Payer: Self-pay | Admitting: Family Medicine

## 2015-03-25 VITALS — BP 150/80 | HR 65 | Temp 98.0°F | Resp 16 | Ht <= 58 in | Wt 178.0 lb

## 2015-03-25 DIAGNOSIS — K219 Gastro-esophageal reflux disease without esophagitis: Secondary | ICD-10-CM

## 2015-03-25 DIAGNOSIS — I10 Essential (primary) hypertension: Secondary | ICD-10-CM | POA: Diagnosis not present

## 2015-03-25 DIAGNOSIS — R300 Dysuria: Secondary | ICD-10-CM | POA: Insufficient documentation

## 2015-03-25 DIAGNOSIS — N3000 Acute cystitis without hematuria: Secondary | ICD-10-CM | POA: Insufficient documentation

## 2015-03-25 DIAGNOSIS — R05 Cough: Secondary | ICD-10-CM | POA: Diagnosis not present

## 2015-03-25 DIAGNOSIS — T464X5A Adverse effect of angiotensin-converting-enzyme inhibitors, initial encounter: Secondary | ICD-10-CM

## 2015-03-25 DIAGNOSIS — R058 Other specified cough: Secondary | ICD-10-CM | POA: Insufficient documentation

## 2015-03-25 LAB — POCT URINALYSIS DIPSTICK
Bilirubin, UA: NEGATIVE
Blood, UA: NEGATIVE
GLUCOSE UA: NEGATIVE
Ketones, UA: NEGATIVE
Nitrite, UA: NEGATIVE
Protein, UA: NEGATIVE
UROBILINOGEN UA: 0.2
pH, UA: 6

## 2015-03-25 MED ORDER — LOSARTAN POTASSIUM 50 MG PO TABS: 50.0000 mg | ORAL_TABLET | Freq: Every day | ORAL | Status: DC

## 2015-03-25 MED ORDER — NITROFURANTOIN MONOHYD MACRO 100 MG PO CAPS: 100.0000 mg | ORAL_CAPSULE | Freq: Two times a day (BID) | ORAL | Status: AC

## 2015-03-25 NOTE — Progress Notes (Signed)
Name: Tracey Carroll   MRN: [id number]    DOB: Jun 14, 1940   Date:03/25/2015       Progress Note  Subjective  Chief Complaint  Chief Complaint  Patient presents with  . Hypertension  . Urinary Tract Infection    2-3 days stinging /burning    HPI Here for f/u of HBP.  Taking higher dose of Lisinopril.  Has begun to develop dry, hacky, throaty cough. Also c/o some dysuria and freq over past 2-3 days.  Urine with stronger smell.  No fever.  No abd. Pain and no N/V.  No problem-specific assessment & plan notes found for this encounter.   Past Medical History  Diagnosis Date  . GERD (gastroesophageal reflux disease)   . Acid reflux 10/19/2014  . Dysphagia 10/20/2014  . 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 Fibroids, intramural 10/19/2014  . Back pain, chronic 10/19/2014  . LBP (low back pain) 10/19/2014  . Elevated BP 10/20/2014  . FOM (frequency of micturition) 10/19/2014  . Arthritis     arms    Social History  Substance Use Topics  . Smoking status: Never Smoker   . Smokeless tobacco: Never Used  . Alcohol Use: No     Current outpatient prescriptions:  .  lisinopril (PRINIVIL,ZESTRIL) 10 MG tablet, Take 1 tablet (10 mg total) by mouth daily., Disp: 30 tablet, Rfl: 6 .  omeprazole (PRILOSEC) 40 MG capsule, Take 1 capsule (40 mg total) by mouth every morning., Disp: 30 capsule, Rfl: 12  No Known Allergies  Review of Systems  Constitutional: Negative for fever, chills, weight loss and malaise/fatigue.  HENT: Negative for hearing loss.   Eyes: Negative for blurred vision and double vision.  Respiratory: Positive for cough. Negative for sputum production, shortness of breath and wheezing.   Cardiovascular: Negative for chest pain, palpitations and leg swelling.  Gastrointestinal: Negative for heartburn, abdominal pain and blood in stool.  Genitourinary: Positive for dysuria, urgency and frequency. Negative for hematuria.  Musculoskeletal: Negative for myalgias and joint pain.   Neurological: Negative for weakness and headaches.      Objective  Filed Vitals:   03/25/15 0814  BP: 164/82  Pulse: 65  Temp: 98 F (36.7 C)  TempSrc: Oral  Resp: 16  Height: 4\' 9"  (1.448 m)  Weight: 178 lb (80.74 kg)     Physical Exam  Constitutional: She is oriented to person, place, and time and well-developed, well-nourished, and in no distress. No distress.  HENT:  Head: Normocephalic and atraumatic.  Eyes: Conjunctivae and EOM are normal. Pupils are equal, round, and reactive to light. No scleral icterus.  Neck: Normal range of motion. Neck supple. Carotid bruit is not present. No thyromegaly present.  Cardiovascular: Normal rate, normal heart sounds and intact distal pulses.  Frequent extrasystoles are present. Exam reveals no gallop and no friction rub.   No murmur heard. bigeminy  Pulmonary/Chest: Effort normal and breath sounds normal. No respiratory distress. She has no wheezes. She has no rales.  Abdominal: Soft. Bowel sounds are normal. She exhibits no distension, no abdominal bruit and no mass. There is no tenderness.  No CVA tenderness  Musculoskeletal: She exhibits no edema.  Lymphadenopathy:    She has no cervical adenopathy.  Neurological: She is alert and oriented to person, place, and time.  Vitals reviewed.     Recent Results (from the past 2160 hour(s))  POCT urinalysis dipstick     Status: Abnormal   Collection Time: 03/25/15  8:24 AM  Result Value Ref  Range   Color, UA yellow    Clarity, UA clear    Glucose, UA neg    Bilirubin, UA neg    Ketones, UA neg    Spec Grav, UA <=1.005    Blood, UA neg    pH, UA 6.0    Protein, UA neg    Urobilinogen, UA 0.2    Nitrite, UA neg    Leukocytes, UA small (1+) (A) Negative     Assessment & Plan  1. Essential hypertension  - losartan (COZAAR) 50 MG tablet; Take 1 tablet (50 mg total) by mouth daily.  Dispense: 30 tablet; Refill: 6  2. Burning with urination  - POCT urinalysis  dipstick - Urine Culture  3. Acute cystitis without hematuria  - Urine Culture - nitrofurantoin, macrocrystal-monohydrate, (MACROBID) 100 MG capsule; Take 1 capsule (100 mg total) by mouth 2 (two) times daily.  Dispense: 14 capsule; Refill: 0  4. Gastroesophageal reflux disease without esophagitis   5. ACE-inhibitor cough

## 2015-03-25 NOTE — Patient Instructions (Signed)
Drink plenty of fluids.  Stop Lisinopril- ACE cough

## 2015-03-27 LAB — URINE CULTURE

## 2015-04-08 ENCOUNTER — Encounter: Payer: Self-pay | Admitting: *Deleted

## 2015-04-08 ENCOUNTER — Emergency Department
Admission: EM | Admit: 2015-04-08 | Discharge: 2015-04-08 | Disposition: A | Discharge status: Home / Self Care | Payer: Commercial Managed Care - HMO | Attending: Student | Admitting: Student

## 2015-04-08 DIAGNOSIS — X58XXXA Exposure to other specified factors, initial encounter: Secondary | ICD-10-CM | POA: Diagnosis not present

## 2015-04-08 DIAGNOSIS — Y998 Other external cause status: Secondary | ICD-10-CM | POA: Insufficient documentation

## 2015-04-08 DIAGNOSIS — H578 Other specified disorders of eye and adnexa: Secondary | ICD-10-CM | POA: Diagnosis present

## 2015-04-08 DIAGNOSIS — S0501XA Injury of conjunctiva and corneal abrasion without foreign body, right eye, initial encounter: Secondary | ICD-10-CM

## 2015-04-08 DIAGNOSIS — Y9389 Activity, other specified: Secondary | ICD-10-CM | POA: Insufficient documentation

## 2015-04-08 DIAGNOSIS — Z79899 Other long term (current) drug therapy: Secondary | ICD-10-CM | POA: Insufficient documentation

## 2015-04-08 DIAGNOSIS — Y9289 Other specified places as the place of occurrence of the external cause: Secondary | ICD-10-CM | POA: Diagnosis not present

## 2015-04-08 MED ORDER — TOBRAMYCIN 0.3 % OP SOLN: 2.0000 [drp] | Freq: Four times a day (QID) | OPHTHALMIC | Status: DC

## 2015-04-08 MED ORDER — FLUORESCEIN SODIUM 1 MG OP STRP
1.0000 | ORAL_STRIP | Freq: Once | OPHTHALMIC | Status: AC
  Administered 2015-04-08: 1 via OPHTHALMIC
  Filled 2015-04-08: qty 1

## 2015-04-08 MED ORDER — TETRACAINE HCL 0.5 % OP SOLN
1.0000 [drp] | Freq: Once | OPHTHALMIC | Status: AC
  Administered 2015-04-08: 1 [drp] via OPHTHALMIC
  Filled 2015-04-08: qty 2

## 2015-04-08 MED ORDER — EYE WASH OPHTH SOLN
1.0000 [drp] | OPHTHALMIC | Status: DC | PRN
  Filled 2015-04-08: qty 118

## 2015-04-08 NOTE — ED Provider Notes (Signed)
[facility] Emergency Department Provider Note  ____________________________________________  Time seen: Approximately 11:27 AM  I have reviewed the triage vital signs and the nursing notes.   HISTORY  Chief Complaint No chief complaint on file.   HPI Tracey Carroll is a 74 y.o. female is here with complaint of bilateral eye redness. Patient states that he has felt like she has something in her eyes but she is unaware of any debris that could've gotten into her eyes. Patient states that her eye drainage has been clear. She denies any photophobia and denies any changes in her vision. In the past she has been to Dr. Chong Sicilian and denies any difficulty with her vision. Patient is unaware of any seasonal allergiesand denies any respiratory illnesses. Symptoms began yesterday. Patient denies any pain. Patient states she began having problems with her left eye and now has moved over to her right eye.   Past Medical History  Diagnosis Date  . GERD (gastroesophageal reflux disease)   . Acid reflux 10/19/2014  . Dysphagia 10/20/2014  . 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 Fibroids, intramural 10/19/2014  . Back pain, chronic 10/19/2014  . LBP (low back pain) 10/19/2014  . Elevated BP 10/20/2014  . FOM (frequency of micturition) 10/19/2014  . Arthritis     arms    Patient Active Problem List   Diagnosis Date Noted  . HBP (high blood pressure) 03/25/2015  . Acute cystitis without hematuria 03/25/2015  . Burning with urination 03/25/2015  . ACE-inhibitor cough 03/25/2015  . Obesity 12/19/2014  . Swallowing difficulty   . Hiatal hernia   . Stricture and stenosis of esophagus   . Dysphagia 10/20/2014  . Elevated BP 10/20/2014  . Back pain, chronic 10/19/2014  . 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 Acid reflux 10/19/2014  . Gravida 3 para 3 10/19/2014  . Blood in the urine 10/19/2014  . Fibroids, intramural 10/19/2014  . LBP (low back pain) 10/19/2014  . FOM (frequency of  micturition) 10/19/2014    Past Surgical History  Procedure Laterality Date  . Tubal ligation    . Esophagogastroduodenoscopy (egd) with propofol N/A 11/20/2014    Procedure: ESOPHAGOGASTRODUODENOSCOPY (EGD) WITH PROPOFOL with dilation;  Surgeon: Lucilla Lame, MD;  Location: [place];  Service: Endoscopy;  Laterality: N/A;    Current Outpatient Rx  Name  Route  Sig  Dispense  Refill  . losartan (COZAAR) 50 MG tablet   Oral   Take 1 tablet (50 mg total) by mouth daily.   30 tablet   6   . omeprazole (PRILOSEC) 40 MG capsule   Oral   Take 1 capsule (40 mg total) by mouth every morning.   30 capsule   12   . tobramycin (TOBREX) 0.3 % ophthalmic solution   Both Eyes   Place 2 drops into both eyes every 6 (six) hours.   5 mL   0     Allergies Review of patient's allergies indicates no known allergies.  Family History  Problem Relation Age of Onset  . Diabetes Father     in her 80 and 37s  . Hypertension Daughter     her 52s    Social History Social History  Substance Use Topics  . Smoking status: Never Smoker   . Smokeless tobacco: Never Used  . Alcohol Use: No    Review of Systems Constitutional: No fever/chills Eyes: No visual changes. Positive redness bilateral eyes ENT: No sore throat. Cardiovascular: Denies chest pain. Respiratory: Denies shortness  of breath. Gastrointestinal:   No nausea, no vomiting.   Genitourinary: Negative for dysuria. Musculoskeletal: Negative for back pain. Skin: Negative for rash. Neurological: Negative for headaches, focal weakness or numbness.  10-point ROS otherwise negative.  ____________________________________________   PHYSICAL EXAM:  VITAL SIGNS: ED Triage Vitals  Enc Vitals Group     BP 04/08/15 1054 124/92 mmHg     Pulse Rate 04/08/15 1054 75     Resp 04/08/15 1054 18     Temp 04/08/15 1054 98.3 F (36.8 C)     Temp Source 04/08/15 1054 Oral     SpO2 04/08/15 1054 97 %     Weight 04/08/15 1054  177 lb (80.287 kg)     Height 04/08/15 1054 5\' 3"  (1.6 m)     Head Cir --      Peak Flow --      Pain Score --      Pain Loc --      Pain Edu? --      Excl. in [place]? --     Constitutional: Alert and oriented. Well appearing and in no acute distress. Eyes: Conjunctivae are normal. PERRL. EOMI. Head: Atraumatic. Nose: No congestion/rhinnorhea. Neck: No stridor.   Cardiovascular: Normal rate, regular rhythm. Grossly normal heart sounds.  Good peripheral circulation. Respiratory: Normal respiratory effort.  No retractions. Lungs CTAB. Gastrointestinal: Soft and nontender. No distention. Musculoskeletal: No lower extremity tenderness nor edema.  No joint effusions. Neurologic:  Normal speech and language. No gross focal neurologic deficits are appreciated. No gait instability. Skin:  Skin is warm, dry and intact. No rash noted. Psychiatric: Mood and affect are normal. Speech and behavior are normal.  ____________________________________________   LABS (all labs ordered are listed, but only abnormal results are displayed)  Labs Reviewed - No data to display   PROCEDURES  Procedure(s) performed: Tetracaine was placed in each eye without any difficulty. Eyelids were inverted without any foreign bodies noted bilaterally. No foreign body was noted on visual exam. There was some 4 seen dye uptake in the right cornea at approximately 4 to 6:00. No foreign body was noted. Both eyes were irrigated with ophthalmic solution. Abrasion appears to be very superficial.  Critical Care performed: No  ____________________________________________   INITIAL IMPRESSION / ASSESSMENT AND PLAN / ED COURSE  Pertinent labs & imaging results that were available during my care of the patient were reviewed by me and considered in my medical decision making (see chart for details).  Patient placed on TobraDex ophthalmic solution. Patient is follow-up with her doctor, Dr. Chong Sicilian if any continued problems or  Dr. Dorita Sciara. She may also return to the emergency room if any severe worsening of her symptoms over the Thanksgiving holiday. ____________________________________________   FINAL CLINICAL IMPRESSION(S) / ED DIAGNOSES  Final diagnoses:  Corneal abrasion, right, initial encounter      [name], PA-C 04/08/15 1435  Joanne Gavel, MD 04/08/15 1550

## 2015-04-08 NOTE — ED Notes (Signed)
Vision acuity L eye 20/70 and R eye 20/70

## 2015-04-08 NOTE — Discharge Instructions (Signed)
Corneal Abrasion °The cornea is the clear covering at the front and center of the eye. When you look at the colored portion of the eye, you are looking through the cornea. It is a thin tissue made up of layers. The top layer is the most sensitive layer. A corneal abrasion happens if this layer is scratched or an injury causes it to come off.  °HOME CARE °· You may be given drops or a medicated cream. Use the medicine as told by your doctor. °· A pressure patch may be put over the eye. If this is done, follow your doctor's instructions for when to remove the patch. Do not drive or use machines while the eye patch is on. Judging distances is hard to do with a patch on. °· See your doctor for a follow-up exam if you are told to do so. It is very important that you keep this appointment. °GET HELP IF:  °· You have pain, are sensitive to light, and have a scratchy feeling in one eye or both eyes. °· Your pressure patch keeps getting loose. You can blink your eye under the patch. °· You have fluid coming from your eye or the lids stick together in the morning. °· You have the same symptoms in the morning that you did with the first abrasion. This could be days, weeks, or months after the first abrasion healed. °  °This information is not intended to replace advice given to you by your health care provider. Make sure you discuss any questions you have with your health care provider. °  °Document Released: 10/19/2007 Document Revised: 01/21/2015 Document Reviewed: 01/07/2013 °Elsevier Interactive Patient Education ©2016 [employer] ° °

## 2015-04-08 NOTE — ED Notes (Signed)
Pt presents with reddened conjunctiva and sclera bilaterally. Pt denies cold signs and symptoms. Denies fever. States eye drainage has been clear and reports increased tearing. Pt denies eyes being matted upon awakening. Pt states "feels like something in my eyes."

## 2015-04-08 NOTE — ED Notes (Signed)
Pt bilateral eyes are red and irritated

## 2015-05-06 ENCOUNTER — Ambulatory Visit (INDEPENDENT_AMBULATORY_CARE_PROVIDER_SITE_OTHER): Payer: Commercial Managed Care - HMO | Admitting: Family Medicine

## 2015-05-06 ENCOUNTER — Encounter: Payer: Self-pay | Admitting: Family Medicine

## 2015-05-06 ENCOUNTER — Other Ambulatory Visit: Payer: Self-pay | Admitting: *Deleted

## 2015-05-06 VITALS — BP 140/80 | HR 60 | Temp 97.8°F | Resp 16 | Ht <= 58 in | Wt 175.0 lb

## 2015-05-06 DIAGNOSIS — R350 Frequency of micturition: Secondary | ICD-10-CM | POA: Diagnosis not present

## 2015-05-06 DIAGNOSIS — I10 Essential (primary) hypertension: Secondary | ICD-10-CM

## 2015-05-06 DIAGNOSIS — R358 Other polyuria: Secondary | ICD-10-CM

## 2015-05-06 DIAGNOSIS — K2100 Gastro-esophageal reflux disease with esophagitis, without bleeding: Secondary | ICD-10-CM

## 2015-05-06 DIAGNOSIS — N390 Urinary tract infection, site not specified: Secondary | ICD-10-CM | POA: Insufficient documentation

## 2015-05-06 DIAGNOSIS — N3000 Acute cystitis without hematuria: Secondary | ICD-10-CM

## 2015-05-06 DIAGNOSIS — R3589 Other polyuria: Secondary | ICD-10-CM

## 2015-05-06 LAB — POCT URINALYSIS DIPSTICK
BILIRUBIN UA: NEGATIVE
Glucose, UA: NEGATIVE
Ketones, UA: NEGATIVE
NITRITE UA: NEGATIVE
Spec Grav, UA: <=1.005
UROBILINOGEN UA: 0.2
pH, UA: 6

## 2015-05-06 MED ORDER — PHENAZOPYRIDINE HCL 100 MG PO TABS: 100.0000 mg | ORAL_TABLET | Freq: Three times a day (TID) | ORAL | Status: DC | PRN

## 2015-05-06 MED ORDER — CEPHALEXIN 500 MG PO CAPS: 500.0000 mg | ORAL_CAPSULE | Freq: Three times a day (TID) | ORAL | Status: AC

## 2015-05-06 MED ORDER — LOSARTAN POTASSIUM 50 MG PO TABS: 50.0000 mg | ORAL_TABLET | Freq: Every day | ORAL | Status: DC

## 2015-05-06 MED ORDER — OMEPRAZOLE 40 MG PO CPDR: 40.0000 mg | DELAYED_RELEASE_CAPSULE | Freq: Every morning | ORAL | Status: DC

## 2015-05-06 NOTE — Progress Notes (Signed)
Name: Tracey Carroll   MRN: [id number]    DOB: 1941/04/26   Date:05/06/2015       Progress Note  Subjective  Chief Complaint  Chief Complaint  Patient presents with  . Urinary Tract Infection    HPI Here with c/o UTI.  2-3 days of increased frequency, dysuria, nocturia.  No fever.  No back pain.  No N/V or abd pain.  No problem-specific assessment & plan notes found for this encounter.   Past Medical History  Diagnosis Date  . GERD (gastroesophageal reflux disease)   . Acid reflux 10/19/2014  . Dysphagia 10/20/2014  . 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 Fibroids, intramural 10/19/2014  . Back pain, chronic 10/19/2014  . LBP (low back pain) 10/19/2014  . Elevated BP 10/20/2014  . FOM (frequency of micturition) 10/19/2014  . Arthritis     arms    Social History  Substance Use Topics  . Smoking status: Never Smoker   . Smokeless tobacco: Never Used  . Alcohol Use: No     Current outpatient prescriptions:  .  losartan (COZAAR) 50 MG tablet, Take 1 tablet (50 mg total) by mouth daily., Disp: 30 tablet, Rfl: 6 .  omeprazole (PRILOSEC) 40 MG capsule, Take 1 capsule (40 mg total) by mouth every morning., Disp: 30 capsule, Rfl: 12 .  tobramycin (TOBREX) 0.3 % ophthalmic solution, Place 2 drops into both eyes every 6 (six) hours., Disp: 5 mL, Rfl: 0  No Known Allergies  Review of Systems  Constitutional: Negative for fever, chills, weight loss and malaise/fatigue.  HENT: Negative for hearing loss.   Eyes: Negative for blurred vision and double vision.  Respiratory: Negative for cough, shortness of breath and wheezing.   Cardiovascular: Negative for chest pain, palpitations and leg swelling.  Gastrointestinal: Negative for heartburn, abdominal pain and blood in stool.  Genitourinary: Positive for dysuria, urgency and frequency.  Skin: Negative for rash.  Neurological: Negative for dizziness, tremors, weakness and headaches.      Objective  Filed Vitals:   05/06/15 0810  BP:  161/93  Pulse: 60  Temp: 97.8 F (36.6 C)  TempSrc: Oral  Resp: 16  Height: 4\' 9"  (1.448 m)  Weight: 175 lb (79.379 kg)     Physical Exam  Constitutional: She is oriented to person, place, and time and well-developed, well-nourished, and in no distress. No distress.  HENT:  Head: Normocephalic and atraumatic.  Cardiovascular: Normal rate, regular rhythm and normal heart sounds.  Exam reveals no gallop and no friction rub.   No murmur heard. Pulmonary/Chest: Effort normal and breath sounds normal. No respiratory distress. She has no wheezes. She has no rales.  Abdominal: Soft. Bowel sounds are normal. She exhibits no distension and no mass. There is no tenderness.  No CV A tenderness.  Neurological: She is alert and oriented to person, place, and time.  Vitals reviewed.     Recent Results (from the past 2160 hour(s))  Urine Culture     Status: None   Collection Time: 03/25/15 12:00 AM  Result Value Ref Range   Urine Culture, Routine Final report    Urine Culture result 1 Comment     Comment: Mixed urogenital flora 10,000-25,000 colony forming units per mL   POCT urinalysis dipstick     Status: Abnormal   Collection Time: 03/25/15  8:24 AM  Result Value Ref Range   Color, UA yellow    Clarity, UA clear    Glucose, UA neg    Bilirubin, UA  neg    Ketones, UA neg    Spec Grav, UA <=1.005    Blood, UA neg    pH, UA 6.0    Protein, UA neg    Urobilinogen, UA 0.2    Nitrite, UA neg    Leukocytes, UA small (1+) (A) Negative  POCT urinalysis dipstick     Status: Abnormal   Collection Time: 05/06/15  8:17 AM  Result Value Ref Range   Color, UA yell    Clarity, UA cloudy    Glucose, UA neg    Bilirubin, UA neg    Ketones, UA neg    Spec Grav, UA <=1.005    Blood, UA small    pH, UA 6.0    Protein, UA trace    Urobilinogen, UA 0.2    Nitrite, UA neg    Leukocytes, UA small (1+) (A) Negative     Assessment & Plan  1. Frequency of urination and polyuria  -  POCT urinalysis dipstick - Urine Culture  2. Acute cystitis without hematuria  - cephALEXin (KEFLEX) 500 MG capsule; Take 1 capsule (500 mg total) by mouth 3 (three) times daily.  Dispense: 21 capsule; Refill: 0 - phenazopyridine (PYRIDIUM) 100 MG tablet; Take 1 tablet (100 mg total) by mouth 3 (three) times daily as needed for pain.  Dispense: 12 tablet; Refill: 0

## 2015-05-08 LAB — URINE CULTURE

## 2015-05-12 ENCOUNTER — Other Ambulatory Visit: Payer: Self-pay | Admitting: Family Medicine

## 2015-05-12 DIAGNOSIS — N390 Urinary tract infection, site not specified: Secondary | ICD-10-CM

## 2015-05-12 MED ORDER — NITROFURANTOIN MONOHYD MACRO 100 MG PO CAPS: 100.0000 mg | ORAL_CAPSULE | Freq: Two times a day (BID) | ORAL | Status: DC

## 2015-05-21 ENCOUNTER — Other Ambulatory Visit: Payer: Self-pay | Admitting: Family Medicine

## 2015-05-21 DIAGNOSIS — I10 Essential (primary) hypertension: Secondary | ICD-10-CM

## 2015-05-21 DIAGNOSIS — K2100 Gastro-esophageal reflux disease with esophagitis, without bleeding: Secondary | ICD-10-CM

## 2015-05-21 MED ORDER — OMEPRAZOLE 40 MG PO CPDR: 40.0000 mg | DELAYED_RELEASE_CAPSULE | Freq: Every morning | ORAL | Status: DC

## 2015-05-21 MED ORDER — LOSARTAN POTASSIUM 50 MG PO TABS: 50.0000 mg | ORAL_TABLET | Freq: Every day | ORAL | Status: DC

## 2015-06-04 ENCOUNTER — Ambulatory Visit (INDEPENDENT_AMBULATORY_CARE_PROVIDER_SITE_OTHER): Payer: Commercial Managed Care - HMO | Admitting: Family Medicine

## 2015-06-04 ENCOUNTER — Encounter: Payer: Self-pay | Admitting: Family Medicine

## 2015-06-04 VITALS — BP 148/80 | HR 68 | Temp 98.1°F | Resp 16 | Ht <= 58 in | Wt 183.0 lb

## 2015-06-04 DIAGNOSIS — I10 Essential (primary) hypertension: Secondary | ICD-10-CM | POA: Diagnosis not present

## 2015-06-04 DIAGNOSIS — L298 Other pruritus: Secondary | ICD-10-CM

## 2015-06-04 DIAGNOSIS — R350 Frequency of micturition: Secondary | ICD-10-CM

## 2015-06-04 DIAGNOSIS — K222 Esophageal obstruction: Secondary | ICD-10-CM

## 2015-06-04 DIAGNOSIS — N898 Other specified noninflammatory disorders of vagina: Secondary | ICD-10-CM | POA: Insufficient documentation

## 2015-06-04 LAB — POCT URINALYSIS DIPSTICK
Bilirubin, UA: NEGATIVE
COLOR UA: NEGATIVE
Clarity, UA: NEGATIVE
Glucose, UA: NEGATIVE
Ketones, UA: NEGATIVE
Leukocytes, UA: NEGATIVE
Nitrite, UA: NEGATIVE
PH UA: 7
PROTEIN UA: NEGATIVE
UROBILINOGEN UA: 0.2

## 2015-06-04 NOTE — Patient Instructions (Signed)
Watch calories and lose some weight.

## 2015-06-04 NOTE — Progress Notes (Signed)
Name: Tracey Carroll   MRN: [id number]    DOB: 1940-09-14   Date:06/04/2015       Progress Note  Subjective  Chief Complaint  Chief Complaint  Patient presents with  . Hypertension    HPI Here for f/u of HBP.  Taking her meds without problems.  C/o vaginal itching and burning.  Has used dome OTC Vagasil without relief.  No problem-specific assessment & plan notes found for this encounter.   Past Medical History  Diagnosis Date  . GERD (gastroesophageal reflux disease)   . Acid reflux 10/19/2014  . Dysphagia 10/20/2014  . 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 Fibroids, intramural 10/19/2014  . Back pain, chronic 10/19/2014  . LBP (low back pain) 10/19/2014  . Elevated BP 10/20/2014  . FOM (frequency of micturition) 10/19/2014  . Arthritis     arms    Past Surgical History  Procedure Laterality Date  . Tubal ligation    . Esophagogastroduodenoscopy (egd) with propofol N/A 11/20/2014    Procedure: ESOPHAGOGASTRODUODENOSCOPY (EGD) WITH PROPOFOL with dilation;  Surgeon: Lucilla Lame, MD;  Location: [facility];  Service: Endoscopy;  Laterality: N/A;    Family History  Problem Relation Age of Onset  . Diabetes Father     in her 18 and 14s  . Hypertension Daughter     her 4s    Social History   Social History  . Marital Status: Single    Spouse Name: N/A  . Number of Children: N/A  . Years of Education: N/A   Occupational History  . Not on file.   Social History Main Topics  . Smoking status: Never Smoker   . Smokeless tobacco: Never Used  . Alcohol Use: No  . Drug Use: No  . Sexual Activity: Not on file   Other Topics Concern  . Not on file   Social History Narrative   ** Merged History Encounter **         Current outpatient prescriptions:  .  losartan (COZAAR) 50 MG tablet, Take 1 tablet (50 mg total) by mouth daily., Disp: 90 tablet, Rfl: 3 .  omeprazole (PRILOSEC) 40 MG capsule, Take 1 capsule (40 mg total) by mouth every morning., Disp: 90 capsule,  Rfl: 3 .  tobramycin (TOBREX) 0.3 % ophthalmic solution, Place 2 drops into both eyes every 6 (six) hours., Disp: 5 mL, Rfl: 0  Not on File   Review of Systems  Constitutional: Negative for fever, chills, weight loss and malaise/fatigue.  HENT: Negative for hearing loss.   Eyes: Negative for blurred vision and double vision.  Respiratory: Negative for cough, shortness of breath and wheezing.   Cardiovascular: Positive for leg swelling (occ. at night). Negative for chest pain and palpitations.  Gastrointestinal: Negative for heartburn, abdominal pain and blood in stool.  Genitourinary: Positive for dysuria. Negative for frequency.  Musculoskeletal: Negative for myalgias and joint pain.  Skin: Positive for itching (vsaginal). Negative for rash.  Neurological: Negative for dizziness, tremors, weakness and headaches.      Objective  Filed Vitals:   06/04/15 0808  BP: 148/80  Pulse: 68  Temp: 98.1 F (36.7 C)  TempSrc: Oral  Resp: 16  Height: 4\' 10"  (1.473 m)  Weight: 183 lb (83.008 kg)    Physical Exam  Constitutional: She is oriented to person, place, and time and well-developed, well-nourished, and in no distress. No distress.  HENT:  Head: Normocephalic and atraumatic.  Eyes: Conjunctivae are normal. Pupils are equal, round, and reactive to  light. No scleral icterus.  Neck: Normal range of motion. Carotid bruit is not present. No thyromegaly present.  Cardiovascular: Normal rate, regular rhythm and normal heart sounds.  Exam reveals no gallop and no friction rub.   No murmur heard. Pulmonary/Chest: Effort normal. No respiratory distress. She has no wheezes. She has no rales.  Abdominal: Soft. Bowel sounds are normal. She exhibits no distension, no abdominal bruit and no mass. There is no tenderness.  No CVA tendereness  Musculoskeletal: She exhibits no edema.  Lymphadenopathy:    She has no cervical adenopathy.  Neurological: She is alert and oriented to person, place,  and time.  Vitals reviewed.      Recent Results (from the past 2160 hour(s))  Urine Culture     Status: None   Collection Time: 03/25/15 12:00 AM  Result Value Ref Range   Urine Culture, Routine Final report    Urine Culture result 1 Comment     Comment: Mixed urogenital flora 10,000-25,000 colony forming units per mL   POCT urinalysis dipstick     Status: Abnormal   Collection Time: 03/25/15  8:24 AM  Result Value Ref Range   Color, UA yellow    Clarity, UA clear    Glucose, UA neg    Bilirubin, UA neg    Ketones, UA neg    Spec Grav, UA <=1.005    Blood, UA neg    pH, UA 6.0    Protein, UA neg    Urobilinogen, UA 0.2    Nitrite, UA neg    Leukocytes, UA small (1+) (A) Negative  Urine Culture     Status: Abnormal   Collection Time: 05/06/15 12:00 AM  Result Value Ref Range   Urine Culture, Routine Final report (A)    Urine Culture result 1 Comment (A)     Comment: Escherichia coli, identified by an automated biochemical system. Greater than 100,000 colony forming units per mL    ANTIMICROBIAL SUSCEPTIBILITY Comment     Comment:       ** S = Susceptible; I = Intermediate; R = Resistant **                    P = Positive; N = Negative             MICS are expressed in micrograms per mL    Antibiotic                 RSLT#1    RSLT#2    RSLT#3    RSLT#4 Amoxicillin/Clavulanic Acid    S Ampicillin                     R Cefepime                       S Ceftriaxone                    S Cefuroxime                     I Cephalothin                    R Ciprofloxacin                  S Ertapenem                      S Gentamicin  S Imipenem                       S Levofloxacin                   S Nitrofurantoin                 S Piperacillin                   R Tetracycline                   S Tobramycin                     S Trimethoprim/Sulfa             S   POCT urinalysis dipstick     Status: Abnormal   Collection Time: 05/06/15  8:17 AM   Result Value Ref Range   Color, UA yell    Clarity, UA cloudy    Glucose, UA neg    Bilirubin, UA neg    Ketones, UA neg    Spec Grav, UA <=1.005    Blood, UA small    pH, UA 6.0    Protein, UA trace    Urobilinogen, UA 0.2    Nitrite, UA neg    Leukocytes, UA small (1+) (A) Negative  POCT urinalysis dipstick     Status: Abnormal   Collection Time: 06/04/15  8:22 AM  Result Value Ref Range   Color, UA neg    Clarity, UA neg    Glucose, UA neg    Bilirubin, UA neg    Ketones, UA neg    Spec Grav, UA <=1.005    Blood, UA trace    pH, UA 7.0    Protein, UA neg    Urobilinogen, UA 0.2    Nitrite, UA neg    Leukocytes, UA Negative Negative     Assessment & Plan  Problem List Items Addressed This Visit      Cardiovascular and Mediastinum   HBP (high blood pressure) - Primary     Digestive   Stricture and stenosis of esophagus     Musculoskeletal and Integument   Vaginal itching     Other   Urine frequency   Relevant Orders   POCT urinalysis dipstick (Completed)   CULTURE, URINE COMPREHENSIVE      No orders of the defined types were placed in this encounter.   1. Essential hypertension Cont. Losartan  2. Urine frequency  - POCT urinalysis dipstick - CULTURE, URINE COMPREHENSIVE  3. Vaginal itching Use OTC Monostat   4. Stricture and stenosis of esophagus Cont. Omeprazole

## 2015-06-07 LAB — CULTURE, URINE COMPREHENSIVE

## 2015-06-10 ENCOUNTER — Other Ambulatory Visit: Payer: Self-pay | Admitting: Family Medicine

## 2015-06-10 ENCOUNTER — Other Ambulatory Visit: Payer: Self-pay | Admitting: *Deleted

## 2015-06-10 ENCOUNTER — Other Ambulatory Visit: Payer: Commercial Managed Care - HMO | Admitting: *Deleted

## 2015-06-10 VITALS — Resp 16 | Ht <= 58 in | Wt 175.0 lb

## 2015-06-10 DIAGNOSIS — N3289 Other specified disorders of bladder: Secondary | ICD-10-CM | POA: Diagnosis not present

## 2015-06-10 DIAGNOSIS — R3989 Other symptoms and signs involving the genitourinary system: Secondary | ICD-10-CM

## 2015-06-10 DIAGNOSIS — R350 Frequency of micturition: Secondary | ICD-10-CM | POA: Diagnosis not present

## 2015-06-10 DIAGNOSIS — N3000 Acute cystitis without hematuria: Secondary | ICD-10-CM

## 2015-06-10 NOTE — Progress Notes (Signed)
Patient came into today for clean catch urine to be sent for culture. Urine sample was collected and processed to send to lab.

## 2015-06-15 LAB — URINE CULTURE

## 2015-06-22 ENCOUNTER — Ambulatory Visit: Payer: Commercial Managed Care - HMO | Admitting: Family Medicine

## 2015-10-27 DIAGNOSIS — B351 Tinea unguium: Secondary | ICD-10-CM | POA: Diagnosis not present

## 2015-10-27 DIAGNOSIS — M79676 Pain in unspecified toe(s): Secondary | ICD-10-CM | POA: Diagnosis not present

## 2015-11-30 ENCOUNTER — Ambulatory Visit: Payer: Commercial Managed Care - HMO | Admitting: Family Medicine

## 2016-01-19 ENCOUNTER — Encounter: Payer: Self-pay | Admitting: Family Medicine

## 2016-01-19 ENCOUNTER — Ambulatory Visit (INDEPENDENT_AMBULATORY_CARE_PROVIDER_SITE_OTHER): Payer: Commercial Managed Care - HMO | Admitting: Family Medicine

## 2016-01-19 VITALS — BP 155/80 | HR 64 | Temp 98.2°F | Resp 16 | Ht <= 58 in | Wt 186.0 lb

## 2016-01-19 DIAGNOSIS — K219 Gastro-esophageal reflux disease without esophagitis: Secondary | ICD-10-CM | POA: Diagnosis not present

## 2016-01-19 DIAGNOSIS — I10 Essential (primary) hypertension: Secondary | ICD-10-CM | POA: Diagnosis not present

## 2016-01-19 MED ORDER — HYDROCHLOROTHIAZIDE 12.5 MG PO TABS: 12.5000 mg | ORAL_TABLET | Freq: Every day | ORAL | 3 refills | Status: DC

## 2016-01-19 NOTE — Progress Notes (Signed)
Name: Tracey Carroll   MRN: [id number]    DOB: August 06, 1940   Date:01/19/2016       Progress Note  Subjective  Chief Complaint  Chief Complaint  Patient presents with  . Hypertension    HPI Here for f/u of HBP and GERD.  Feeling well overall.  No c/o.  Taking her meds daily.  No problem-specific Assessment & Plan notes found for this encounter.   Past Medical History:  Diagnosis Date  . Acid reflux 10/19/2014  . Arthritis    arms  . Back pain, chronic 10/19/2014  . Dysphagia 10/20/2014  . Elevated BP 10/20/2014  . Fibroids, intramural 10/19/2014  . FOM (frequency of micturition) 10/19/2014  . GERD (gastroesophageal reflux disease)   . 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 LBP (low back pain) 10/19/2014    Past Surgical History:  Procedure Laterality Date  . ESOPHAGOGASTRODUODENOSCOPY (EGD) WITH PROPOFOL N/A 11/20/2014   Procedure: ESOPHAGOGASTRODUODENOSCOPY (EGD) WITH PROPOFOL with dilation;  Surgeon: Lucilla Lame, MD;  Location: [facility];  Service: Endoscopy;  Laterality: N/A;  . TUBAL LIGATION      Family History  Problem Relation Age of Onset  . Diabetes Father     in her 54 and 25s  . Hypertension Daughter     her 15s    Social History   Social History  . Marital status: Single    Spouse name: N/A  . Number of children: N/A  . Years of education: N/A   Occupational History  . Not on file.   Social History Main Topics  . Smoking status: Never Smoker  . Smokeless tobacco: Never Used  . Alcohol use No  . Drug use: No  . Sexual activity: Not on file   Other Topics Concern  . Not on file   Social History Narrative   ** Merged History Encounter **         Current Outpatient Prescriptions:  .  losartan (COZAAR) 50 MG tablet, Take 1 tablet (50 mg total) by mouth daily., Disp: 90 tablet, Rfl: 3 .  omeprazole (PRILOSEC) 40 MG capsule, Take 1 capsule (40 mg total) by mouth every morning., Disp: 90 capsule, Rfl: 3 .  hydrochlorothiazide (HYDRODIURIL) 12.5 MG  tablet, Take 1 tablet (12.5 mg total) by mouth daily., Disp: 90 tablet, Rfl: 3  Not on File   Review of Systems  Constitutional: Negative for chills, fever, malaise/fatigue and weight loss.  HENT: Negative for hearing loss.   Eyes: Negative for blurred vision and double vision.  Respiratory: Negative for cough, shortness of breath and wheezing.   Cardiovascular: Negative for chest pain, palpitations and leg swelling.  Gastrointestinal: Negative for abdominal pain, blood in stool and heartburn.  Genitourinary: Negative for dysuria, frequency and urgency.  Musculoskeletal: Positive for neck pain (occ, intermittan t). Negative for joint pain and myalgias.  Skin: Negative for rash.  Neurological: Negative for dizziness, sensory change, focal weakness, weakness and headaches.      Objective  Vitals:   01/19/16 0806 01/19/16 0912  BP: (!) 163/91 (!) 155/80  Pulse: 64   Resp: 16   Temp: 98.2 F (36.8 C)   TempSrc: Oral   Weight: 186 lb (84.4 kg)   Height: 4\' 10"  (1.473 m)     Physical Exam  Constitutional: She is oriented to person, place, and time and well-developed, well-nourished, and in no distress. No distress.  HENT:  Head: Normocephalic and atraumatic.  Eyes: Conjunctivae and EOM are normal. Pupils are equal, round, and  reactive to light. No scleral icterus.  Neck: Normal range of motion. Neck supple. No thyromegaly present.  Cardiovascular: Normal rate, regular rhythm and normal heart sounds.  Exam reveals no gallop and no friction rub.   No murmur heard. Pulmonary/Chest: Effort normal and breath sounds normal. No respiratory distress. She has no wheezes. She has no rales.  Abdominal: Soft. Bowel sounds are normal. She exhibits no distension and no mass. There is no tenderness.  Musculoskeletal: She exhibits no edema.  Lymphadenopathy:    She has no cervical adenopathy.  Neurological: She is alert and oriented to person, place, and time.  Vitals  reviewed.      No results found for this or any previous visit (from the past 2160 hour(s)).   Assessment & Plan  Problem List Items Addressed This Visit      Cardiovascular and Mediastinum   HBP (high blood pressure) - Primary   Relevant Medications   hydrochlorothiazide (HYDRODIURIL) 12.5 MG tablet     Digestive   Acid reflux    Other Visit Diagnoses   None.     Meds ordered this encounter  Medications  . hydrochlorothiazide (HYDRODIURIL) 12.5 MG tablet    Sig: Take 1 tablet (12.5 mg total) by mouth daily.    Dispense:  90 tablet    Refill:  3   1. Essential hypertension Cont Losartan, 50 mg./d. - hydrochlorothiazide (HYDRODIURIL) 12.5 MG tablet; Take 1 tablet (12.5 mg total) by mouth daily.  Dispense: 90 tablet; Refill: 3  2. Gastroesophageal reflux disease without esophagitis cont Proilosec.

## 2016-01-29 DIAGNOSIS — M79676 Pain in unspecified toe(s): Secondary | ICD-10-CM | POA: Diagnosis not present

## 2016-01-29 DIAGNOSIS — B351 Tinea unguium: Secondary | ICD-10-CM | POA: Diagnosis not present

## 2016-03-21 ENCOUNTER — Ambulatory Visit (INDEPENDENT_AMBULATORY_CARE_PROVIDER_SITE_OTHER): Payer: Commercial Managed Care - HMO | Admitting: Family Medicine

## 2016-03-21 ENCOUNTER — Encounter: Payer: Self-pay | Admitting: Family Medicine

## 2016-03-21 VITALS — BP 128/76 | HR 74 | Temp 98.2°F | Resp 16 | Ht <= 58 in | Wt 186.0 lb

## 2016-03-21 DIAGNOSIS — Z23 Encounter for immunization: Secondary | ICD-10-CM

## 2016-03-21 DIAGNOSIS — R300 Dysuria: Secondary | ICD-10-CM | POA: Diagnosis not present

## 2016-03-21 DIAGNOSIS — I10 Essential (primary) hypertension: Secondary | ICD-10-CM

## 2016-03-21 LAB — POCT URINALYSIS DIPSTICK
BILIRUBIN UA: NEGATIVE
Glucose, UA: NEGATIVE
Ketones, UA: NEGATIVE
NITRITE UA: NEGATIVE
PH UA: 5
Protein, UA: NEGATIVE
Spec Grav, UA: <=1.005
Urobilinogen, UA: 0.2

## 2016-03-21 MED ORDER — CEPHALEXIN 500 MG PO CAPS: 500.0000 mg | ORAL_CAPSULE | Freq: Four times a day (QID) | ORAL | 0 refills | Status: DC

## 2016-03-21 NOTE — Progress Notes (Signed)
Name: Tracey Carroll   MRN: [id number]    DOB: [birth year]   Date:03/21/2016       Progress Note  Subjective  Chief Complaint  Chief Complaint  Patient presents with  . Hypertension  . Urinary Tract Infection    HPI Here for f/u of HBP.  Taking all meds and feeling ok from that.    Also c/o some burning with urination x 1 week.  No fever.  No N/V.  Some lower stomach and low back pain that she thinks may cone from bladder.  No problem-specific Assessment & Plan notes found for this encounter.   Past Medical History:  Diagnosis Date  . Acid reflux 10/19/2014  . Arthritis    arms  . Back pain, chronic 10/19/2014  . Dysphagia 10/20/2014  . Elevated BP 10/20/2014  . Fibroids, intramural 10/19/2014  . FOM (frequency of micturition) 10/19/2014  . GERD (gastroesophageal reflux disease)   . 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 LBP (low back pain) 10/19/2014    Past Surgical History:  Procedure Laterality Date  . ESOPHAGOGASTRODUODENOSCOPY (EGD) WITH PROPOFOL N/A 11/20/2014   Procedure: ESOPHAGOGASTRODUODENOSCOPY (EGD) WITH PROPOFOL with dilation;  Surgeon: Lucilla Lame, MD;  Location: [facility];  Service: Endoscopy;  Laterality: N/A;  . TUBAL LIGATION      Family History  Problem Relation Age of Onset  . Diabetes Father     in her 42 and 45s  . Hypertension Daughter     her 7s    Social History   Social History  . Marital status: Single    Spouse name: N/A  . Number of children: N/A  . Years of education: N/A   Occupational History  . Not on file.   Social History Main Topics  . Smoking status: Never Smoker  . Smokeless tobacco: Never Used  . Alcohol use No  . Drug use: No  . Sexual activity: Not on file   Other Topics Concern  . Not on file   Social History Narrative   ** Merged History Encounter **         Current Outpatient Prescriptions:  .  hydrochlorothiazide (HYDRODIURIL) 12.5 MG tablet, Take 1 tablet (12.5 mg total) by mouth daily., Disp: 90 tablet,  Rfl: 3 .  losartan (COZAAR) 50 MG tablet, Take 1 tablet (50 mg total) by mouth daily., Disp: 90 tablet, Rfl: 3 .  omeprazole (PRILOSEC) 40 MG capsule, Take 1 capsule (40 mg total) by mouth every morning., Disp: 90 capsule, Rfl: 3 .  cephALEXin (KEFLEX) 500 MG capsule, Take 1 capsule (500 mg total) by mouth 4 (four) times daily. Take 4 timers a day for 1 week., Disp: 28 capsule, Rfl: 0  Not on File   Review of Systems  Constitutional: Negative for chills, fever, malaise/fatigue and weight loss.  HENT: Negative for hearing loss and tinnitus.   Eyes: Negative for blurred vision and double vision.  Respiratory: Negative for cough, shortness of breath and wheezing.   Cardiovascular: Positive for leg swelling (later in day.). Negative for chest pain and palpitations.  Gastrointestinal: Negative for abdominal pain, blood in stool, diarrhea, heartburn, nausea and vomiting.  Genitourinary: Positive for dysuria, frequency and urgency. Negative for flank pain and hematuria.  Musculoskeletal: Negative for myalgias.  Skin: Negative for rash.  Neurological: Negative for dizziness and tingling.      Objective  Vitals:   03/21/16 0921  BP: 128/76  Pulse: 74  Resp: 16  Temp: 98.2 F (36.8 C)  TempSrc: Oral  Weight: 186 lb (84.4 kg)  Height: 4\' 10"  (1.473 m)    Physical Exam  Constitutional: She is oriented to person, place, and time and well-developed, well-nourished, and in no distress. No distress.  HENT:  Head: Normocephalic and atraumatic.  Eyes: Conjunctivae are normal. Pupils are equal, round, and reactive to light.  Neck: Normal range of motion. Neck supple. No thyromegaly present.  Cardiovascular: Normal rate, regular rhythm and normal heart sounds.  Exam reveals no gallop and no friction rub.   No murmur heard. Pulmonary/Chest: Effort normal and breath sounds normal. No respiratory distress. She has no wheezes. She has no rales.  Abdominal: Soft. Bowel sounds are normal. She  exhibits no distension and no mass. There is no tenderness.  No CVA tenderness.  Musculoskeletal: She exhibits no edema.  Lymphadenopathy:    She has no cervical adenopathy.  Neurological: She is alert and oriented to person, place, and time.  Vitals reviewed.      Recent Results (from the past 2160 hour(s))  POCT urinalysis dipstick     Status: Abnormal   Collection Time: 03/21/16  9:35 AM  Result Value Ref Range   Color, UA YELLOW    Clarity, UA CLEAR    Glucose, UA NEG    Bilirubin, UA NEG    Ketones, UA NEG    Spec Grav, UA <=1.005    Blood, UA MODERATE    pH, UA 5.0    Protein, UA NEG    Urobilinogen, UA 0.2    Nitrite, UA NEG    Leukocytes, UA Trace (A) Negative     Assessment & Plan  Problem List Items Addressed This Visit      Cardiovascular and Mediastinum   HBP (high blood pressure)     Other   Burning with urination - Primary   Relevant Medications   cephALEXin (KEFLEX) 500 MG capsule   Other Relevant Orders   POCT urinalysis dipstick (Completed)   Flu vaccine HIGH DOSE PF (Fluzone High dose) (Completed)   Urine Culture    Other Visit Diagnoses    Need for vaccination          Meds ordered this encounter  Medications  . cephALEXin (KEFLEX) 500 MG capsule    Sig: Take 1 capsule (500 mg total) by mouth 4 (four) times daily. Take 4 timers a day for 1 week.    Dispense:  28 capsule    Refill:  0   1. Burning with urination  - POCT urinalysis dipstick - Flu vaccine HIGH DOSE PF (Fluzone High dose) - Urine Culture - cephALEXin (KEFLEX) 500 MG capsule; Take 1 capsule (500 mg total) by mouth 4 (four) times daily. Take 4 timers a day for 1 week.  Dispense: 28 capsule; Refill: 0  2. Need for vaccination High dose flu shot given  3. Essential hypertension Cont HCTZ and Losatan

## 2016-03-23 LAB — URINE CULTURE: ORGANISM ID, BACTERIA: NO GROWTH

## 2016-04-12 DIAGNOSIS — M79609 Pain in unspecified limb: Secondary | ICD-10-CM | POA: Diagnosis not present

## 2016-04-12 DIAGNOSIS — B351 Tinea unguium: Secondary | ICD-10-CM | POA: Diagnosis not present

## 2016-05-18 ENCOUNTER — Other Ambulatory Visit: Payer: Self-pay | Admitting: *Deleted

## 2016-05-18 DIAGNOSIS — I10 Essential (primary) hypertension: Secondary | ICD-10-CM

## 2016-05-18 MED ORDER — LOSARTAN POTASSIUM 50 MG PO TABS: 50.0000 mg | ORAL_TABLET | Freq: Every day | ORAL | 0 refills | Status: DC

## 2016-07-15 DIAGNOSIS — B351 Tinea unguium: Secondary | ICD-10-CM | POA: Diagnosis not present

## 2016-07-15 DIAGNOSIS — M79609 Pain in unspecified limb: Secondary | ICD-10-CM | POA: Diagnosis not present

## 2016-08-08 ENCOUNTER — Ambulatory Visit (INDEPENDENT_AMBULATORY_CARE_PROVIDER_SITE_OTHER): Payer: Medicare HMO | Admitting: Family Medicine

## 2016-08-08 ENCOUNTER — Encounter: Payer: Self-pay | Admitting: Family Medicine

## 2016-08-08 VITALS — BP 141/67 | HR 69 | Temp 98.4°F | Resp 16 | Ht <= 58 in | Wt 182.0 lb

## 2016-08-08 DIAGNOSIS — R319 Hematuria, unspecified: Secondary | ICD-10-CM | POA: Diagnosis not present

## 2016-08-08 DIAGNOSIS — Z87898 Personal history of other specified conditions: Secondary | ICD-10-CM | POA: Diagnosis not present

## 2016-08-08 DIAGNOSIS — R300 Dysuria: Secondary | ICD-10-CM

## 2016-08-08 DIAGNOSIS — K219 Gastro-esophageal reflux disease without esophagitis: Secondary | ICD-10-CM

## 2016-08-08 DIAGNOSIS — I10 Essential (primary) hypertension: Secondary | ICD-10-CM | POA: Diagnosis not present

## 2016-08-08 DIAGNOSIS — N3000 Acute cystitis without hematuria: Secondary | ICD-10-CM

## 2016-08-08 LAB — POCT URINALYSIS DIPSTICK
Bilirubin, UA: NEGATIVE
GLUCOSE UA: NEGATIVE
KETONES UA: NEGATIVE
Nitrite, UA: NEGATIVE
PROTEIN UA: NEGATIVE
SPEC GRAV UA: 1.01 (ref 1.030–1.035)
Urobilinogen, UA: 0.2 (ref ?–2.0)
pH, UA: 6 (ref 5.0–8.0)

## 2016-08-08 NOTE — Progress Notes (Signed)
Name: Tracey Carroll   MRN: [id number]    DOB: 27-Apr-1941   Date:08/08/2016       Progress Note  Subjective  Chief Complaint  Chief Complaint  Patient presents with  . Hypertension  . Dysuria  . Gastroesophageal Reflux    HPI Here for f/u of HBP and GERD.  Has some reflux sx occ, even on meds.  She c/o dysuria as usual.  Has some trace leukocytes in urine.  Overall feeling pretty good.  No problem-specific Assessment & Plan notes found for this encounter.   Past Medical History:  Diagnosis Date  . Acid reflux 10/19/2014  . Arthritis    arms  . Back pain, chronic 10/19/2014  . Dysphagia 10/20/2014  . Elevated BP 10/20/2014  . Fibroids, intramural 10/19/2014  . FOM (frequency of micturition) 10/19/2014  . GERD (gastroesophageal reflux disease)   . 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 LBP (low back pain) 10/19/2014    Past Surgical History:  Procedure Laterality Date  . ESOPHAGOGASTRODUODENOSCOPY (EGD) WITH PROPOFOL N/A 11/20/2014   Procedure: ESOPHAGOGASTRODUODENOSCOPY (EGD) WITH PROPOFOL with dilation;  Surgeon: Lucilla Lame, MD;  Location: [place];  Service: Endoscopy;  Laterality: N/A;  . TUBAL LIGATION      Family History  Problem Relation Age of Onset  . Diabetes Father     in her 9 and 77s  . Hypertension Daughter     her 71s    Social History   Social History  . Marital status: Single    Spouse name: N/A  . Number of children: N/A  . Years of education: N/A   Occupational History  . Not on file.   Social History Main Topics  . Smoking status: Never Smoker  . Smokeless tobacco: Never Used  . Alcohol use No  . Drug use: No  . Sexual activity: Not on file   Other Topics Concern  . Not on file   Social History Narrative   ** Merged History Encounter **         Current Outpatient Prescriptions:  .  hydrochlorothiazide (HYDRODIURIL) 12.5 MG tablet, Take 1 tablet (12.5 mg total) by mouth daily., Disp: 90 tablet, Rfl: 3 .  losartan (COZAAR) 50 MG  tablet, Take 1 tablet (50 mg total) by mouth daily., Disp: 30 tablet, Rfl: 0 .  omeprazole (PRILOSEC) 40 MG capsule, Take 1 capsule (40 mg total) by mouth every morning., Disp: 90 capsule, Rfl: 3  Not on File   Review of Systems  Constitutional: Negative for chills, fever, malaise/fatigue and weight loss.  HENT: Negative for hearing loss and tinnitus.   Eyes: Negative for blurred vision and double vision.  Respiratory: Negative for cough, shortness of breath and wheezing.   Cardiovascular: Positive for leg swelling (occ). Negative for chest pain and palpitations.  Gastrointestinal: Positive for heartburn (occ). Negative for abdominal pain and blood in stool.  Genitourinary: Positive for dysuria and frequency. Negative for urgency.  Musculoskeletal: Negative for joint pain and myalgias.  Skin: Negative for rash.  Neurological: Negative for dizziness, tingling, tremors, weakness and headaches.      Objective  Vitals:   08/08/16 0858  BP: (!) 141/67  Pulse: 69  Resp: 16  Temp: 98.4 F (36.9 C)  TempSrc: Oral  Weight: 182 lb (82.6 kg)  Height: 4\' 10"  (1.473 m)    Physical Exam  Constitutional: She is oriented to person, place, and time and well-developed, well-nourished, and in no distress. No distress.  HENT:  Head: Normocephalic and  atraumatic.  Eyes: Conjunctivae and EOM are normal. Pupils are equal, round, and reactive to light. No scleral icterus.  Neck: Normal range of motion. Neck supple. No thyromegaly present.  Cardiovascular: Normal rate, regular rhythm and normal heart sounds.  Exam reveals no gallop and no friction rub.   No murmur heard. Pulmonary/Chest: Effort normal and breath sounds normal. No respiratory distress. She has no wheezes. She has no rales.  Abdominal: Soft. Bowel sounds are normal. She exhibits no distension and no mass. There is no tenderness.  No CVA tenderness  Musculoskeletal: She exhibits edema (trace bilateral pedal edema).   Lymphadenopathy:    She has no cervical adenopathy.  Neurological: She is alert and oriented to person, place, and time.  Vitals reviewed.      Recent Results (from the past 2160 hour(s))  POCT urinalysis dipstick     Status: Abnormal   Collection Time: 08/08/16  9:06 AM  Result Value Ref Range   Color, UA yellow    Clarity, UA cloudy    Glucose, UA neg    Bilirubin, UA neg    Ketones, UA neg    Spec Grav, UA 1.010 1.030 - 1.035   Blood, UA small    pH, UA 6.0 5.0 - 8.0   Protein, UA neg    Urobilinogen, UA 0.2 Negative - 2.0   Nitrite, UA neg    Leukocytes, UA Trace (A) Negative     Assessment & Plan  Problem List Items Addressed This Visit      Cardiovascular and Mediastinum   HBP (high blood pressure)     Digestive   Acid reflux     Genitourinary   RESOLVED: Urinary tract infection     Other   Blood in the urine   Relevant Orders   Urine Culture   Burning with urination    Other Visit Diagnoses    History of painful urination    -  Primary   Relevant Orders   POCT urinalysis dipstick (Completed)   Urine Culture      No orders of the defined types were placed in this encounter.  1. History of painful urination  - POCT urinalysis dipstick - Urine Culture  2. Hematuria, unspecified type  - Urine Culture  3. Essential hypertension  Cont Losartan and HCTZ. 4. Gastroesophageal reflux disease without esophagitis cont Omeprazole  5. Acute cystitis without hematuria   6. Burning with urination Take the Keflex 250 mg., 4 times a day that she has left over at home until culture is back

## 2016-08-09 LAB — URINE CULTURE: Organism ID, Bacteria: NO GROWTH

## 2016-08-18 ENCOUNTER — Other Ambulatory Visit: Payer: Self-pay | Admitting: Family Medicine

## 2016-08-18 DIAGNOSIS — K2100 Gastro-esophageal reflux disease with esophagitis, without bleeding: Secondary | ICD-10-CM

## 2016-08-18 DIAGNOSIS — I10 Essential (primary) hypertension: Secondary | ICD-10-CM

## 2016-08-18 MED ORDER — OMEPRAZOLE 40 MG PO CPDR: 40.0000 mg | DELAYED_RELEASE_CAPSULE | Freq: Every morning | ORAL | 3 refills | Status: DC

## 2016-08-18 MED ORDER — LOSARTAN POTASSIUM 50 MG PO TABS: 50.0000 mg | ORAL_TABLET | Freq: Every day | ORAL | 3 refills | Status: DC

## 2016-10-18 ENCOUNTER — Telehealth: Payer: Self-pay | Admitting: Family Medicine

## 2016-10-18 NOTE — Telephone Encounter (Signed)
Called pt to schedule Annual Wellness Visit with Nurse Health Advisor for July :  - knb

## 2016-10-28 DIAGNOSIS — B351 Tinea unguium: Secondary | ICD-10-CM | POA: Diagnosis not present

## 2016-10-28 DIAGNOSIS — M79609 Pain in unspecified limb: Secondary | ICD-10-CM | POA: Diagnosis not present

## 2016-12-09 ENCOUNTER — Encounter: Payer: Self-pay | Admitting: Nurse Practitioner

## 2016-12-09 ENCOUNTER — Ambulatory Visit (INDEPENDENT_AMBULATORY_CARE_PROVIDER_SITE_OTHER): Payer: Medicare HMO | Admitting: Nurse Practitioner

## 2016-12-09 VITALS — BP 149/78 | HR 68 | Temp 98.7°F | Ht <= 58 in | Wt 190.8 lb

## 2016-12-09 DIAGNOSIS — I10 Essential (primary) hypertension: Secondary | ICD-10-CM | POA: Diagnosis not present

## 2016-12-09 DIAGNOSIS — K219 Gastro-esophageal reflux disease without esophagitis: Secondary | ICD-10-CM

## 2016-12-09 DIAGNOSIS — R300 Dysuria: Secondary | ICD-10-CM

## 2016-12-09 DIAGNOSIS — Z1239 Encounter for other screening for malignant neoplasm of breast: Secondary | ICD-10-CM

## 2016-12-09 DIAGNOSIS — Z1231 Encounter for screening mammogram for malignant neoplasm of breast: Secondary | ICD-10-CM

## 2016-12-09 LAB — POCT URINALYSIS DIPSTICK
Bilirubin, UA: NEGATIVE
Glucose, UA: NEGATIVE
Ketones, UA: NEGATIVE
Nitrite, UA: NEGATIVE
Protein, UA: NEGATIVE
Spec Grav, UA: 1.01 (ref 1.010–1.025)
Urobilinogen, UA: 0.2 E.U./dL
pH, UA: 7 (ref 5.0–8.0)

## 2016-12-09 MED ORDER — RANITIDINE HCL 75 MG PO TABS: 75.0000 mg | ORAL_TABLET | Freq: Two times a day (BID) | ORAL | 0 refills | Status: DC

## 2016-12-09 MED ORDER — HYDROCHLOROTHIAZIDE 25 MG PO TABS: 25.0000 mg | ORAL_TABLET | Freq: Every day | ORAL | 1 refills | Status: DC

## 2016-12-09 NOTE — Patient Instructions (Addendum)
Tracey Carroll, Thank you for coming in to clinic today.  1. For your blood pressure: - Continue taking your blood pressure at least 1 per week. - Your GOAL less than 130/80 and higher than 100/60. - Call the clinic if your blood pressure is higher than 140/90  2. For your urine: - We will send your urine culture to see if you have an infection.  If you do, we will send a prescription.    3. For your acid reflux: - Continue your omeprazole. - May take ranitidine 75 mg twice daily for 2 weeks and repeat in future for heartburn.  Do not take every day after 2 weeks.  4. For your mammogram: Your mammogram order has been placed.  Call the Scheduling phone number at [phone] to schedule your mammogram at your convenience.  You can choose to go to either location listed below.  Let the scheduler know which location you prefer.  [facility] [address] [place], [place] [address] [place], [place] [address]    5. Consider a Medicare Wellness Visit in the next few months to take care of your screening tests we would do at a annual physical.   Please schedule a follow-up appointment with Cassell Smiles, AGNP. Return in about 3 months (around 03/11/2017) for Blood Pressure, GERD.  If you have any other questions or concerns, please feel free to call the clinic or send a message through [place]. You may also schedule an earlier appointment if necessary.  You will receive a survey after today's visit either digitally by e-mail or paper by [employer]. Your experiences and feedback matter to [place].  Please respond so we know how we are doing as we provide care for you.   Cassell Smiles, DNP, AGNP-BC Adult Gerontology Nurse Practitioner [place]

## 2016-12-09 NOTE — Progress Notes (Signed)
Subjective:    Patient ID: Tracey Carroll, female    DOB: Jan 04, 1941, 76 y.o.   MRN: [id number]  Tracey Carroll is a 76 y.o. female presenting on 12/09/2016 for Hypertension and Dysuria (intermittent )   HPI  Hypertension She is checking BP at home occasionally.  Readings 139/80s  Current medications: hydrochlorothiazide 12.5 once daily and losartan 50 mg once daily, tolerating well without side effects  Pt denies headache, lightheadedness, dizziness, changes in vision, chest tightness/pressure, palpitations, sudden loss of speech or loss of consciousness. She is symptomatic with swelling in hands/feet.   Dysuria Pt has intermittent symptoms of vaginal itching and odor to urine.  Today had burning of urine.  New back pain and stomach pain.  Has not had treatment for any UTI in recent past. Denies fever, chills, sweats. Pt states she never took Keflex after last visit.   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  Finds it boring    GERD Still has burning in throat, tastes gastric contents in mornings.  Pt has been taking omeprazole 40 mg once daily.    Denies cough, hoarseness, nausea, vomiting, abdominal pain.  Social History  Substance Use Topics  . Smoking status: Never Smoker  . Smokeless tobacco: Never Used  . Alcohol use No    Review of Systems Per HPI unless specifically indicated above     Objective:    BP (!) 149/78 (BP Location: Right Arm, Patient Position: Sitting, Cuff Size: Normal)   Pulse 68   Temp 98.7 F (37.1 C) (Oral)   Ht 4\' 10"  (1.473 m)   Wt 190 lb 12.8 oz (86.5 kg)   BMI 39.88 kg/m   Wt Readings from Last 3 Encounters:  12/09/16 190 lb 12.8 oz (86.5 kg)  08/08/16 182 lb (82.6 kg)  03/21/16 186 lb (84.4 kg)    Physical Exam General - obese, well-appearing, NAD HEENT - Normocephalic, atraumatic, PERRL, EOMI, patent nares w/o congestion, oropharynx clear, MMM Neck - supple, non-tender, no LAD, no carotid  bruit Heart - RRR, no murmurs heard Lungs - Clear throughout all lobes, no wheezing, crackles, or rhonchi. Normal work of breathing. Abdomen - soft, NTND, no masses, no hepatosplenomegaly, active bowel sounds Extremeties - non-tender, +1 upper and lower extremity edema, cap refill < 2 seconds, peripheral pulses intact +2 bilaterally Skin - warm, dry, no rashes Neuro - awake, alert, oriented x3, normal gait Psych - Normal mood and affect, normal behavior    Results for orders placed or performed in visit on 12/09/16  CULTURE, URINE COMPREHENSIVE  Result Value Ref Range   Organism ID, Bacteria NO GROWTH   Urine Culture  Result Value Ref Range   Culture NO GROWTH   Comprehensive metabolic panel  Result Value Ref Range   Sodium 138 135 - 146 mmol/L   Potassium 4.1 3.5 - 5.3 mmol/L   Chloride 101 98 - 110 mmol/L   CO2 25 20 - 31 mmol/L   Glucose, Bld 71 65 - 99 mg/dL   BUN 7 7 - 25 mg/dL   Creat 0.79 0.60 - 0.93 mg/dL   Total Bilirubin 0.5 0.2 - 1.2 mg/dL   Alkaline Phosphatase 63 33 - 130 U/L   AST 22 10 - 35 U/L   ALT 17 6 - 29 U/L   Total Protein 6.9 6.1 - 8.1 g/dL   Albumin 4.0 3.6 - 5.1 g/dL   Calcium 9.4 8.6 - 10.4 mg/dL  POCT Urinalysis Dipstick  Result Value Ref Range   Color, UA yellow    Clarity, UA clear    Glucose, UA neg    Bilirubin, UA neg    Ketones, UA neg    Spec Grav, UA 1.010 1.010 - 1.025   Blood, UA trace    pH, UA 7.0 5.0 - 8.0   Protein, UA neg    Urobilinogen, UA 0.2 0.2 or 1.0 E.U./dL   Nitrite, UA neg    Leukocytes, UA Trace (A) Negative      Assessment & Plan:   Problem List Items Addressed This Visit      Cardiovascular and Mediastinum   HBP (high blood pressure) - Primary    uncontrolled hypertension.  BP not at goal.  Pt is working on lifestyle modifications.  Taking medications tolerating well without side effects.   Plan: 1. INCREASE lisinopril 25 mg once daily. 2. Obtain labs CMP  3. Encouraged heart healthy diet and increasing  exercise. 4. Check BP 1-2 x per week at home, keep log, and bring to clinic at next appointment. 5. Follow up 3 months. Call clinic w/ BP readings in 2 weeks.       Relevant Medications   hydrochlorothiazide (HYDRODIURIL) 25 MG tablet   Other Relevant Orders   Comprehensive metabolic panel (Completed)     Digestive   Acid reflux    Uncontrolled.  Pt w/ breakthrough symptoms w/ omeprazole 40 mg once daily.  Plan: 1. Use 2 week trial of ranitidine 75 mg bid then stop. 2. Continue omeprazole 40 mg once daily. Consider 20 mg bid dosing in future or GI referral if no resolution of symptoms. 3. Follow up 3 months or sooner if needed.      Relevant Medications   ranitidine (ZANTAC) 75 MG tablet     Other   Burning with urination    Recurrent dysuria.  Previous dysuria w/o bacterial growth.   Plan: 1. UA and urine culture today.  No empiric treatment. 2. Consider urology referral if no growth for further evaluation of dysuria. 3. Follow up as needed.      Relevant Orders   POCT Urinalysis Dipstick (Completed)   CULTURE, URINE COMPREHENSIVE (Completed)   Ambulatory referral to Urology    Other Visit Diagnoses    Breast cancer screening       Pt requests order for mammogram screening.  No screening in last 2 years.  Plan: 1. Mammogram placed.  Pt instructed to call to schedule   Relevant Orders   MM DIGITAL SCREENING BILATERAL      Meds ordered this encounter  Medications  . hydrochlorothiazide (HYDRODIURIL) 25 MG tablet    Sig: Take 1 tablet (25 mg total) by mouth daily.    Dispense:  90 tablet    Refill:  1    Order Specific Question:   Supervising Provider    Answer:   Olin Hauser [[id number]]  . ranitidine (ZANTAC) 75 MG tablet    Sig: Take 1 tablet (75 mg total) by mouth 2 (two) times daily.    Dispense:  60 tablet    Refill:  0    Order Specific Question:   Supervising Provider    Answer:   Olin Hauser [[id number]]      Follow up  plan: Return in about 3 months (around 03/11/2017) for Blood Pressure, GERD.    Cassell Smiles, DNP, AGPCNP-BC Adult Gerontology Primary Care Nurse Practitioner [facility] 12/13/2016, 9:21 PM

## 2016-12-10 LAB — COMPREHENSIVE METABOLIC PANEL
ALT: 17 U/L (ref 6–29)
AST: 22 U/L (ref 10–35)
Albumin: 4 g/dL (ref 3.6–5.1)
Alkaline Phosphatase: 63 U/L (ref 33–130)
BUN: 7 mg/dL (ref 7–25)
CO2: 25 mmol/L (ref 20–31)
Calcium: 9.4 mg/dL (ref 8.6–10.4)
Chloride: 101 mmol/L (ref 98–110)
Creat: 0.79 mg/dL (ref 0.60–0.93)
Glucose, Bld: 71 mg/dL (ref 65–99)
Potassium: 4.1 mmol/L (ref 3.5–5.3)
Sodium: 138 mmol/L (ref 135–146)
Total Bilirubin: 0.5 mg/dL (ref 0.2–1.2)
Total Protein: 6.9 g/dL (ref 6.1–8.1)

## 2016-12-11 LAB — CULTURE, URINE COMPREHENSIVE: Organism ID, Bacteria: NO GROWTH

## 2016-12-11 LAB — URINE CULTURE: Culture: NO GROWTH

## 2016-12-13 NOTE — Assessment & Plan Note (Signed)
Uncontrolled.  Pt w/ breakthrough symptoms w/ omeprazole 40 mg once daily.  Plan: 1. Use 2 week trial of ranitidine 75 mg bid then stop. 2. Continue omeprazole 40 mg once daily. Consider 20 mg bid dosing in future or GI referral if no resolution of symptoms. 3. Follow up 3 months or sooner if needed.

## 2016-12-13 NOTE — Progress Notes (Signed)
I have reviewed this encounter including the documentation in this note and/or discussed this patient with the provider, Cassell Smiles, AGPCNP-BC. I am certifying that I agree with the content of this note as supervising physician.  Nobie Putnam, [facility] 12/13/2016, 9:38 PM

## 2016-12-13 NOTE — Assessment & Plan Note (Signed)
uncontrolled hypertension.  BP not at goal.  Pt is working on lifestyle modifications.  Taking medications tolerating well without side effects.   Plan: 1. INCREASE lisinopril 25 mg once daily. 2. Obtain labs CMP  3. Encouraged heart healthy diet and increasing exercise. 4. Check BP 1-2 x per week at home, keep log, and bring to clinic at next appointment. 5. Follow up 3 months. Call clinic w/ BP readings in 2 weeks.

## 2016-12-13 NOTE — Assessment & Plan Note (Signed)
Recurrent dysuria.  Previous dysuria w/o bacterial growth.   Plan: 1. UA and urine culture today.  No empiric treatment. 2. Consider urology referral if no growth for further evaluation of dysuria. 3. Follow up as needed.

## 2016-12-28 ENCOUNTER — Ambulatory Visit
Admission: RE | Admit: 2016-12-28 | Discharge: 2016-12-28 | Disposition: A | Discharge status: Home / Self Care | Payer: Commercial Managed Care - HMO | Source: Ambulatory Visit | Attending: Nurse Practitioner | Admitting: Nurse Practitioner

## 2016-12-28 DIAGNOSIS — Z1239 Encounter for other screening for malignant neoplasm of breast: Secondary | ICD-10-CM

## 2016-12-28 DIAGNOSIS — Z1231 Encounter for screening mammogram for malignant neoplasm of breast: Secondary | ICD-10-CM | POA: Diagnosis not present

## 2017-01-03 NOTE — Progress Notes (Signed)
01/04/2017 4:27 PM   Tracey Carroll 02-06-1941 [id number]  Referring provider: Mikey College, NP [place], [place] [address]  Chief Complaint  Patient presents with  . New Patient (Initial Visit)    Dysuria old patient in allscripts referred by [facility]    HPI: Patient is a 76 -year-old African-American female who is referred to [place] by, Mikey College, NP, for dysuria.  She endorses dysuria x 2 months and suprapubic pain.  She is having some nausea.  She has not had any recent fevers, chills, nausea or vomiting.  She denies any gross hematuria.   She does not have a history of nephrolithiasis, GU surgery or GU trauma.   She is not sexually active.  She is postmenopausal.   She denies constipation and/or diarrhea.  She does/does not engage in good perineal hygiene. She does take tub baths. She is having frequency and nocturia.  She is not having pain with bladder filling.    She has not had any recent imaging studies.  She did undergo a hematuria work up in 2015 with CTU and cystoscopy.  No malignancies were found.  She is not drinking a lot of water daily.  2 cups of coffee daily.   Reviewed referral notes.    PMH: Past Medical History:  Diagnosis Date  . Acid reflux 10/19/2014  . Arthritis    arms  . Back pain, chronic 10/19/2014  . Dysphagia 10/20/2014  . Elevated BP 10/20/2014  . Fibroids, intramural 10/19/2014  . FOM (frequency of micturition) 10/19/2014  . GERD (gastroesophageal reflux disease)   . 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 LBP (low back pain) 10/19/2014    Surgical History: Past Surgical History:  Procedure Laterality Date  . ESOPHAGOGASTRODUODENOSCOPY (EGD) WITH PROPOFOL N/A 11/20/2014   Procedure: ESOPHAGOGASTRODUODENOSCOPY (EGD) WITH PROPOFOL with dilation;  Surgeon: Lucilla Lame, MD;  Location: [place];  Service: Endoscopy;  Laterality: N/A;  . TUBAL LIGATION      Home Medications:  Allergies as of  01/04/2017   No Known Allergies     Medication List       Accurate as of 01/04/17  4:27 PM. Always use your most recent med list.          hydrochlorothiazide 25 MG tablet Commonly known as:  HYDRODIURIL Take 1 tablet (25 mg total) by mouth daily.   losartan 50 MG tablet Commonly known as:  COZAAR Take 1 tablet (50 mg total) by mouth daily.   omeprazole 40 MG capsule Commonly known as:  PRILOSEC Take 1 capsule (40 mg total) by mouth every morning.   ranitidine 75 MG tablet Commonly known as:  ZANTAC Take 1 tablet (75 mg total) by mouth 2 (two) times daily.            Discharge Care Instructions        Start     Ordered   01/04/17 0000  Urinalysis, Complete     01/04/17 1521   01/04/17 0000  BLADDER SCAN AMB NON-IMAGING     01/04/17 1521   01/04/17 0000  BUN+Creat     01/04/17 1621   01/04/17 0000  CT HEMATURIA WORKUP    Question Answer Comment  Reason for Exam (SYMPTOM  OR DIAGNOSIS REQUIRED) microscopic hematuria   Preferred imaging location? [facility]      01/04/17 1621      Allergies: No Known Allergies  Family History: Family History  Problem Relation Age  of Onset  . Diabetes Father        in her 30 and 52s  . Hypertension Daughter        her 45s  . Kidney cancer Neg Hx   . Bladder Cancer Neg Hx     Social History:  reports that she has never smoked. She has never used smokeless tobacco. She reports that she does not drink alcohol or use drugs.  ROS: UROLOGY Frequent Urination?: Yes Hard to postpone urination?: No Burning/pain with urination?: Yes Get up at night to urinate?: Yes Leakage of urine?: No Urine stream starts and stops?: No Trouble starting stream?: No Do you have to strain to urinate?: No Blood in urine?: No Urinary tract infection?: No Sexually transmitted disease?: No Injury to kidneys or bladder?: No Painful intercourse?: No Weak stream?: No Currently pregnant?: No Vaginal bleeding?: No Last menstrual  period?: n  Gastrointestinal Nausea?: No Vomiting?: No Indigestion/heartburn?: No Diarrhea?: No Constipation?: No  Constitutional Fever: No Night sweats?: Yes Weight loss?: No Fatigue?: No  Skin Skin rash/lesions?: No Itching?: No  Eyes Blurred vision?: Yes Double vision?: No  Ears/Nose/Throat Sore throat?: No Sinus problems?: No  Hematologic/Lymphatic Swollen glands?: No Easy bruising?: No  Cardiovascular Leg swelling?: Yes Chest pain?: No  Respiratory Cough?: No Shortness of breath?: No  Endocrine Excessive thirst?: No  Musculoskeletal Back pain?: Yes Joint pain?: No  Neurological Headaches?: No Dizziness?: No  Psychologic Depression?: No Anxiety?: No  Physical Exam: BP 126/82   Pulse (!) 101   Ht 5\' 1"  (1.549 m)   Wt 190 lb 6.4 oz (86.4 kg)   BMI 35.98 kg/m   Constitutional: Well nourished. Alert and oriented, No acute distress. HEENT: [place] AT, moist mucus membranes. Trachea midline, no masses. Cardiovascular: No clubbing, cyanosis, or edema. Respiratory: Normal respiratory effort, no increased work of breathing. GI: Abdomen is soft, non tender, non distended, no abdominal masses. Liver and spleen not palpable.  No hernias appreciated.  Stool sample for occult testing is not indicated.   GU: No CVA tenderness.  No bladder fullness or masses.  Atrophic external genitalia, normal pubic hair distribution, no lesions.  Normal urethral meatus, no lesions, no prolapse, no discharge.   No urethral masses, tenderness and/or tenderness. No bladder fullness, tenderness or masses. Normal vagina mucosa, good estrogen effect, no discharge, no lesions, good pelvic support, no cystocele or rectocele noted.  No cervical motion tenderness.  Uterus is freely mobile and non-fixed.  No adnexal/parametria masses or tenderness noted.  Anus and perineum are without rashes or lesions.    Skin: No rashes, bruises or suspicious lesions. Lymph: No cervical or inguinal  adenopathy. Neurologic: Grossly intact, no focal deficits, moving all 4 extremities. Psychiatric: Normal mood and affect.  Laboratory Data: Lab Results  Component Value Date   CREATININE 0.79 12/09/2016    Lab Results  Component Value Date   AST 22 12/09/2016   Lab Results  Component Value Date   ALT 17 12/09/2016    Urinalysis 3-10 RBC's.  See EPIC.    I have reviewed the labs.   Pertinent Imaging: CLINICAL DATA:  Microhematuria and pelvic pain. Burning during  urination.   EXAM:  CT ABDOMEN AND PELVIS WITHOUT AND WITH CONTRAST   TECHNIQUE:  Multidetector CT imaging of the abdomen and pelvis was performed  following the standard protocol before and following the bolus  administration of intravenous contrast.   CONTRAST:  125 cc of Isovue 370   COMPARISON:  Ultrasound 02/12/2014  FINDINGS:  Lower chest: There is no pleural effusion identified. The lung bases  appear clear.   Hepatobiliary: There is no suspicious liver abnormality. The  gallbladder appears normal.   Pancreas: Unremarkable appearance of the pancreas.   Spleen: The spleen appears normal.   Adrenals/Urinary Tract: Normal appearance of both adrenal glands. On  the precontrast images there are no renal calculi or bladder calculi  identified. There is bilateral and symmetric pelvocaliectasis. On  the delayed images there is symmetric excretion of contrast material  by both kidneys. No abnormal filling defects identified within the  collecting systems or ureters. The urinary bladder appears normal.   Stomach/Bowel: The stomach is within normal limits. The small bowel  loops have a normal course and caliber. No obstruction. The appendix  is visualized and appears normal. Multiple colonic diverticula are  identified. No evidence for acute diverticulitis.   Vascular/Lymphatic: There is calcified atherosclerotic disease  involving the abdominal aorta. No aneurysm. No enlarged    retroperitoneal or mesenteric adenopathy. No enlarged pelvic or  inguinal lymph nodes.   Reproductive: Right lateral wall uterine fibroid measures 2.9 cm,  image 58/series 4. The adnexal structures are unremarkable.   Other: There is no ascites or focal fluid collections within the  abdomen or pelvis. There is a an umbilical hernia which contains fat  only, image 41/series 4.   Musculoskeletal: Mild multi level degenerative disc disease  identified within the lower thoracic and upper lumbar spine.   IMPRESSION:  1. No acute findings within the abdomen or pelvis.  2. No nephrolithiasis or evidence of the urothelial lesion  identified.  3. Bilateral and symmetric pelvocaliectasis.  4. Uterine fibroid.  5. Atherosclerotic disease.    Electronically Signed    By: Kerby Moors M.D.    On: 04/01/2014 15:31       Assessment & Plan:    1. Dysuria  - UA with 3-10 RBC's  - CTU and cystoscopy are pending  2. Microscopic hematuria  - I explained to the patient that there are a number of causes that can be associated with blood in the urine, such as stones,UTI's, damage to the urinary tract and/or cancer.  - At this time, I felt that the patient warranted further urologic evaluation.   The AUA guidelines state that a CT urogram is the preferred imaging study to evaluate hematuria.  - I explained to the patient that a contrast material will be injected into a vein and that in rare instances, an allergic reaction can result and may even life threatening   The patient denies any allergies to contrast, iodine and/or seafood and is not taking metformin.  - Her reproductive status is postmenopausal  - Following the imaging study,  I've recommended a cystoscopy. I described how this is performed, typically in an office setting with a flexible cystoscope. We described the risks, benefits, and possible side effects, the most common of which is a minor amount of blood in the urine and/or  burning which usually resolves in 24 to 48 hours.   - The patient had the opportunity to ask questions which were answered. Based upon this discussion, the patient is willing to proceed. Therefore, I've ordered: a CT Urogram and cystoscopy.  - The patient will return following all of the above for discussion of the results.   - UA  - Urine culture  - BUN + creatinine    3. Vaginal atrophy  - Patient was given a sample of vaginal estrogen cream (  Premarin) and instructed to apply 0.5mg  (pea-sized amount)  just inside the vaginal introitus with a finger-tip every night for two weeks and then Monday, Wednesday and Friday nights.  I explained to the patient that vaginally administered estrogen, which causes only a slight increase in the blood estrogen levels, have fewer contraindications and adverse systemic effects that oral HT.  - She will follow up in three months for an exam.      Return for CT Urogram report and cystoscopy.  These notes generated with voice recognition software. I apologize for typographical errors.  Zara Council, [facility] [address], [place], [place] [address] [phone]

## 2017-01-04 ENCOUNTER — Encounter: Payer: Self-pay | Admitting: Urology

## 2017-01-04 ENCOUNTER — Ambulatory Visit: Payer: Medicare HMO | Admitting: Urology

## 2017-01-04 VITALS — BP 126/82 | HR 101 | Ht 61.0 in | Wt 190.4 lb

## 2017-01-04 DIAGNOSIS — N952 Postmenopausal atrophic vaginitis: Secondary | ICD-10-CM | POA: Diagnosis not present

## 2017-01-04 DIAGNOSIS — R300 Dysuria: Secondary | ICD-10-CM

## 2017-01-04 DIAGNOSIS — R3129 Other microscopic hematuria: Secondary | ICD-10-CM | POA: Diagnosis not present

## 2017-01-04 LAB — URINALYSIS, COMPLETE
BILIRUBIN UA: NEGATIVE
GLUCOSE, UA: NEGATIVE
Ketones, UA: NEGATIVE
LEUKOCYTES UA: NEGATIVE
Nitrite, UA: NEGATIVE
Specific Gravity, UA: >1.03 — ABNORMAL HIGH (ref 1.005–1.030)
UUROB: 0.2 mg/dL (ref 0.2–1.0)
pH, UA: 5.5 (ref 5.0–7.5)

## 2017-01-04 LAB — MICROSCOPIC EXAMINATION
BACTERIA UA: NONE SEEN
WBC UA: NONE SEEN /HPF (ref 0–?)

## 2017-01-04 LAB — BLADDER SCAN AMB NON-IMAGING: SCAN RESULT: 21

## 2017-01-05 LAB — BUN+CREAT
BUN/Creatinine Ratio: 18 (ref 12–28)
BUN: 14 mg/dL (ref 8–27)
Creatinine, Ser: 0.79 mg/dL (ref 0.57–1.00)
GFR calc non Af Amer: 74 mL/min/{1.73_m2} (ref >59)
GFR, EST AFRICAN AMERICAN: 86 mL/min/{1.73_m2} (ref >59)

## 2017-01-06 DIAGNOSIS — M79609 Pain in unspecified limb: Secondary | ICD-10-CM | POA: Diagnosis not present

## 2017-01-06 DIAGNOSIS — B351 Tinea unguium: Secondary | ICD-10-CM | POA: Diagnosis not present

## 2017-01-13 ENCOUNTER — Ambulatory Visit
Admission: RE | Admit: 2017-01-13 | Discharge: 2017-01-13 | Disposition: A | Discharge status: Home / Self Care | Payer: Medicare HMO | Source: Ambulatory Visit | Attending: Urology | Admitting: Urology

## 2017-01-13 DIAGNOSIS — R3129 Other microscopic hematuria: Secondary | ICD-10-CM

## 2017-01-13 DIAGNOSIS — R319 Hematuria, unspecified: Secondary | ICD-10-CM | POA: Diagnosis not present

## 2017-01-13 DIAGNOSIS — I700 Atherosclerosis of aorta: Secondary | ICD-10-CM | POA: Diagnosis not present

## 2017-01-13 MED ORDER — IOPAMIDOL (ISOVUE-300) INJECTION 61%
125.0000 mL | Freq: Once | INTRAVENOUS | Status: AC | PRN
  Administered 2017-01-13: 125 mL via INTRAVENOUS

## 2017-01-17 ENCOUNTER — Ambulatory Visit (INDEPENDENT_AMBULATORY_CARE_PROVIDER_SITE_OTHER): Payer: Medicare HMO

## 2017-01-17 VITALS — BP 100/62 | HR 60 | Temp 98.7°F | Resp 16 | Ht 61.0 in | Wt 193.6 lb

## 2017-01-17 DIAGNOSIS — Z0000 Encounter for general adult medical examination without abnormal findings: Secondary | ICD-10-CM

## 2017-01-17 DIAGNOSIS — Z23 Encounter for immunization: Secondary | ICD-10-CM

## 2017-01-17 NOTE — Progress Notes (Signed)
Subjective:   Tracey Carroll is a 76 y.o. female who presents for Medicare Annual (Subsequent) preventive examination.  Review of Systems:  Cardiac Risk Factors include: advanced age (>2men, >73 women);obesity (BMI >30kg/m2);hypertension     Objective:     Vitals: BP 100/62 (BP Location: Right Arm, Patient Position: Sitting)   Pulse 60   Temp 98.7 F (37.1 C)   Resp 16   Ht 5\' 1"  (1.549 m)   Wt 193 lb 9.6 oz (87.8 kg)   BMI 36.58 kg/m   Body mass index is 36.58 kg/m.   Tobacco History  Smoking Status  . Never Smoker  Smokeless Tobacco  . Never Used     Counseling given: Not Answered   Past Medical History:  Diagnosis Date  . Acid reflux 10/19/2014  . Arthritis    arms  . Back pain, chronic 10/19/2014  . Dysphagia 10/20/2014  . Elevated BP 10/20/2014  . Fibroids, intramural 10/19/2014  . FOM (frequency of micturition) 10/19/2014  . GERD (gastroesophageal reflux disease)   . 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 LBP (low back pain) 10/19/2014   Past Surgical History:  Procedure Laterality Date  . ESOPHAGOGASTRODUODENOSCOPY (EGD) WITH PROPOFOL N/A 11/20/2014   Procedure: ESOPHAGOGASTRODUODENOSCOPY (EGD) WITH PROPOFOL with dilation;  Surgeon: Lucilla Lame, MD;  Location: [place];  Service: Endoscopy;  Laterality: N/A;  . TUBAL LIGATION     Family History  Problem Relation Age of Onset  . Diabetes Father        in her 29 and 49s  . Hypertension Daughter        her 82s  . Kidney cancer Neg Hx   . Bladder Cancer Neg Hx    History  Sexual Activity  . Sexual activity: Not on file    Outpatient Encounter Prescriptions as of 01/17/2017  Medication Sig  . hydrochlorothiazide (HYDRODIURIL) 25 MG tablet Take 1 tablet (25 mg total) by mouth daily.  Marland Kitchen losartan (COZAAR) 50 MG tablet Take 1 tablet (50 mg total) by mouth daily.  Marland Kitchen omeprazole (PRILOSEC) 40 MG capsule Take 1 capsule (40 mg total) by mouth every morning.  . ranitidine (ZANTAC) 75 MG tablet Take 1 tablet (75  mg total) by mouth 2 (two) times daily.   No facility-administered encounter medications on file as of 01/17/2017.     Activities of Daily Living In your present state of health, do you have any difficulty performing the following activities: 01/17/2017 08/08/2016  Hearing? N N  Vision? N N  Difficulty concentrating or making decisions? N N  Walking or climbing stairs? N N  Dressing or bathing? N N  Doing errands, shopping? N N  Preparing Food and eating ? N -  Using the Toilet? N -  In the past six months, have you accidently leaked urine? N -  Do you have problems with loss of bowel control? N -  Managing your Medications? N -  Managing your Finances? N -  Housekeeping or managing your Housekeeping? N -  Some recent data might be hidden    Patient Care Team: Mikey College, NP as PCP - General (Nurse Practitioner)    Assessment:     Exercise Activities and Dietary recommendations Current Exercise Habits: The patient does not participate in regular exercise at present, Exercise limited by: None identified  Goals    . Increase water intake          Recommend drinking at least 5-6 glasses of water a day  Fall Risk Fall Risk  01/17/2017 01/19/2016 05/06/2015 03/25/2015 01/22/2015  Falls in the past year? No No No No No  Risk for fall due to : - - - - -   Depression Screen PHQ 2/9 Scores 01/17/2017 01/19/2016 05/06/2015 03/25/2015  PHQ - 2 Score 0 0 0 0     Cognitive Function     6CIT Screen 01/17/2017  What Year? 0 points  What month? 0 points  What time? 0 points  Count back from 20 0 points  Months in reverse 0 points  Repeat phrase 2 points  Total Score 2    Immunization History  Administered Date(s) Administered  . Influenza, High Dose Seasonal PF 01/22/2015, 03/21/2016, 01/17/2017  . Influenza-Unspecified 04/14/2014, 04/14/2014  . Pneumococcal Polysaccharide-23 04/16/2012  . Tdap 08/08/2013, 08/08/2013   Screening Tests Health Maintenance  Topic Date  Due  . INFLUENZA VACCINE  12/14/2016  . MAMMOGRAM  12/29/2018  . TETANUS/TDAP  08/09/2023  . COLONOSCOPY  12/18/2024  . DEXA SCAN  Completed  . PNA vac Low Risk Adult  Completed      Plan:      I have personally reviewed and addressed the Medicare Annual Wellness questionnaire and have noted the following in the patient's chart:  A. Medical and social history B. Use of alcohol, tobacco or illicit drugs  C. Current medications and supplements D. Functional ability and status E.  Nutritional status F.  Physical activity G. Advance directives H. List of other physicians I.  Hospitalizations, surgeries, and ER visits in previous 12 months J.  [place] such as hearing and vision if needed, cognitive and depression L. Referrals and appointments   In addition, I have reviewed and discussed with patient certain preventive protocols, quality metrics, and best practice recommendations. A written personalized care plan for preventive services as well as general preventive health recommendations were provided to patient.   Signed,  Tyler Aas, LPN Nurse Health Advisor   MD Recommendations:none

## 2017-01-17 NOTE — Patient Instructions (Addendum)
Tracey Carroll , Thank you for taking time to come for your Medicare Wellness Visit. I appreciate your ongoing commitment to your health goals. Please review the following plan we discussed and let me know if I can assist you in the future.   Screening recommendations/referrals: Colonoscopy: completed 12/19/2014 Mammogram: completed 12/29/2016 Bone Density: completed 01/22/2015 Recommended yearly ophthalmology/optometry visit for glaucoma screening and checkup Recommended yearly dental visit for hygiene and checkup  Vaccinations: Influenza vaccine: done today  Pneumococcal vaccine: up to date Tdap vaccine: up to date Shingles vaccine: due, check with your insurance company for coverage.  Advanced directives: Advance directive discussed with you today. I have provided a copy for you to complete at home and have notarized. Once this is complete please bring a copy in to our office so we can scan it into your chart.  Conditions/risks identified: Recommend drinking at least 5-6 glasses of water a day  Next appointment: Follow up on 03/13/2017 at 8:00am with Lissa Merlin. Follow up in one year for your annual wellness exam.    Preventive Care 74 Years and Older, Female Preventive care refers to lifestyle choices and visits with your health care provider that can promote health and wellness. What does preventive care include?  A yearly physical exam. This is also called an annual well check.  Dental exams once or twice a year.  Routine eye exams. Ask your health care provider how often you should have your eyes checked.  Personal lifestyle choices, including:  Daily care of your teeth and gums.  Regular physical activity.  Eating a healthy diet.  Avoiding tobacco and drug use.  Limiting alcohol use.  Practicing safe sex.  Taking low-dose aspirin every day.  Taking vitamin and mineral supplements as recommended by your health care provider. What happens during an annual well  check? The services and screenings done by your health care provider during your annual well check will depend on your age, overall health, lifestyle risk factors, and family history of disease. Counseling  Your health care provider may ask you questions about your:  Alcohol use.  Tobacco use.  Drug use.  Emotional well-being.  Home and relationship well-being.  Sexual activity.  Eating habits.  History of falls.  Memory and ability to understand (cognition).  Work and work Statistician.  Reproductive health. Screening  You may have the following tests or measurements:  Height, weight, and BMI.  Blood pressure.  Lipid and cholesterol levels. These may be checked every 5 years, or more frequently if you are over 49 years old.  Skin check.  Lung cancer screening. You may have this screening every year starting at age 74 if you have a 30-pack-year history of smoking and currently smoke or have quit within the past 15 years.  Fecal occult blood test (FOBT) of the stool. You may have this test every year starting at age 74.  Flexible sigmoidoscopy or colonoscopy. You may have a sigmoidoscopy every 5 years or a colonoscopy every 10 years starting at age 74.  Hepatitis C blood test.  Hepatitis B blood test.  Sexually transmitted disease (STD) testing.  Diabetes screening. This is done by checking your blood sugar (glucose) after you have not eaten for a while (fasting). You may have this done every 1-3 years.  Bone density scan. This is done to screen for osteoporosis. You may have this done starting at age 74.  Mammogram. This may be done every 1-2 years. Talk to your health care provider about how  often you should have regular mammograms. Talk with your health care provider about your test results, treatment options, and if necessary, the need for more tests. Vaccines  Your health care provider may recommend certain vaccines, such as:  Influenza vaccine. This is  recommended every year.  Tetanus, diphtheria, and acellular pertussis (Tdap, Td) vaccine. You may need a Td booster every 10 years.  Zoster vaccine. You may need this after age 74.  Pneumococcal 13-valent conjugate (PCV13) vaccine. One dose is recommended after age 74.  Pneumococcal polysaccharide (PPSV23) vaccine. One dose is recommended after age 74. Talk to your health care provider about which screenings and vaccines you need and how often you need them. This information is not intended to replace advice given to you by your health care provider. Make sure you discuss any questions you have with your health care provider. Document Released: 05/29/2015 Document Revised: 01/20/2016 Document Reviewed: 03/03/2015 Elsevier Interactive Patient Education  2017 [place] Prevention in the Home Falls can cause injuries. They can happen to people of all ages. There are many things you can do to make your home safe and to help prevent falls. What can I do on the outside of my home?  Regularly fix the edges of walkways and driveways and fix any cracks.  Remove anything that might make you trip as you walk through a door, such as a raised step or threshold.  Trim any bushes or trees on the path to your home.  Use bright outdoor lighting.  Clear any walking paths of anything that might make someone trip, such as rocks or tools.  Regularly check to see if handrails are loose or broken. Make sure that both sides of any steps have handrails.  Any raised decks and porches should have guardrails on the edges.  Have any leaves, snow, or ice cleared regularly.  Use sand or salt on walking paths during winter.  Clean up any spills in your garage right away. This includes oil or grease spills. What can I do in the bathroom?  Use night lights.  Install grab bars by the toilet and in the tub and shower. Do not use towel bars as grab bars.  Use non-skid mats or decals in the tub or  shower.  If you need to sit down in the shower, use a plastic, non-slip stool.  Keep the floor dry. Clean up any water that spills on the floor as soon as it happens.  Remove soap buildup in the tub or shower regularly.  Attach bath mats securely with double-sided non-slip rug tape.  Do not have throw rugs and other things on the floor that can make you trip. What can I do in the bedroom?  Use night lights.  Make sure that you have a light by your bed that is easy to reach.  Do not use any sheets or blankets that are too big for your bed. They should not hang down onto the floor.  Have a firm chair that has side arms. You can use this for support while you get dressed.  Do not have throw rugs and other things on the floor that can make you trip. What can I do in the kitchen?  Clean up any spills right away.  Avoid walking on wet floors.  Keep items that you use a lot in easy-to-reach places.  If you need to reach something above you, use a strong step stool that has a grab bar.  Keep electrical  cords out of the way.  Do not use floor polish or wax that makes floors slippery. If you must use wax, use non-skid floor wax.  Do not have throw rugs and other things on the floor that can make you trip. What can I do with my stairs?  Do not leave any items on the stairs.  Make sure that there are handrails on both sides of the stairs and use them. Fix handrails that are broken or loose. Make sure that handrails are as long as the stairways.  Check any carpeting to make sure that it is firmly attached to the stairs. Fix any carpet that is loose or worn.  Avoid having throw rugs at the top or bottom of the stairs. If you do have throw rugs, attach them to the floor with carpet tape.  Make sure that you have a light switch at the top of the stairs and the bottom of the stairs. If you do not have them, ask someone to add them for you. What else can I do to help prevent  falls?  Wear shoes that:  Do not have high heels.  Have rubber bottoms.  Are comfortable and fit you well.  Are closed at the toe. Do not wear sandals.  If you use a stepladder:  Make sure that it is fully opened. Do not climb a closed stepladder.  Make sure that both sides of the stepladder are locked into place.  Ask someone to hold it for you, if possible.  Clearly mark and make sure that you can see:  Any grab bars or handrails.  First and last steps.  Where the edge of each step is.  Use tools that help you move around (mobility aids) if they are needed. These include:  Canes.  Walkers.  Scooters.  Crutches.  Turn on the lights when you go into a dark area. Replace any light bulbs as soon as they burn out.  Set up your furniture so you have a clear path. Avoid moving your furniture around.  If any of your floors are uneven, fix them.  If there are any pets around you, be aware of where they are.  Review your medicines with your doctor. Some medicines can make you feel dizzy. This can increase your chance of falling. Ask your doctor what other things that you can do to help prevent falls. This information is not intended to replace advice given to you by your health care provider. Make sure you discuss any questions you have with your health care provider. Document Released: 02/26/2009 Document Revised: 10/08/2015 Document Reviewed: 06/06/2014 Elsevier Interactive Patient Education  2017 [employer].

## 2017-01-18 ENCOUNTER — Encounter: Payer: Self-pay | Admitting: Urology

## 2017-01-18 ENCOUNTER — Other Ambulatory Visit: Payer: Medicare HMO | Admitting: Urology

## 2017-02-12 ENCOUNTER — Telehealth: Payer: Self-pay | Admitting: Urology

## 2017-02-12 NOTE — Telephone Encounter (Signed)
Please call Tracey Carroll and have her reschedule her cystoscopy to complete her hematuria work up.

## 2017-02-13 NOTE — Telephone Encounter (Signed)
Spoke to patient. Gave instructions per Shannon's previous note. Pt verbalized understanding. Parked call for Angie to schedule.

## 2017-02-20 DIAGNOSIS — H25813 Combined forms of age-related cataract, bilateral: Secondary | ICD-10-CM | POA: Diagnosis not present

## 2017-02-20 DIAGNOSIS — H524 Presbyopia: Secondary | ICD-10-CM | POA: Diagnosis not present

## 2017-02-28 ENCOUNTER — Ambulatory Visit (INDEPENDENT_AMBULATORY_CARE_PROVIDER_SITE_OTHER): Payer: Medicare HMO | Admitting: Urology

## 2017-02-28 VITALS — BP 134/80 | HR 70 | Ht <= 58 in | Wt 192.0 lb

## 2017-02-28 DIAGNOSIS — R3129 Other microscopic hematuria: Secondary | ICD-10-CM | POA: Diagnosis not present

## 2017-02-28 LAB — URINALYSIS, COMPLETE
Bilirubin, UA: NEGATIVE
Glucose, UA: NEGATIVE
KETONES UA: NEGATIVE
NITRITE UA: NEGATIVE
Protein, UA: NEGATIVE
SPEC GRAV UA: 1.02 (ref 1.005–1.030)
UUROB: 0.2 mg/dL (ref 0.2–1.0)
pH, UA: 5.5 (ref 5.0–7.5)

## 2017-02-28 MED ORDER — CIPROFLOXACIN HCL 500 MG PO TABS
500.0000 mg | ORAL_TABLET | Freq: Once | ORAL | Status: AC
  Administered 2017-02-28: 500 mg via ORAL

## 2017-02-28 MED ORDER — LIDOCAINE HCL 2 % EX GEL
1.0000 "application " | Freq: Once | CUTANEOUS | Status: AC
  Administered 2017-02-28: 1 via URETHRAL

## 2017-02-28 NOTE — Progress Notes (Signed)
02/28/2017 9:17 AM   Tracey Carroll 07/14/1940 [id number]  Referring provider: Mikey College, NP [place], [place] [address]  No chief complaint on file.   HPI:  F/u for cystoscopy with MH and dysuria. She was rx'd TV estrogen but hasn't started it. Her symptoms improved. No dysuria or gross hematuria.    PMH: Past Medical History:  Diagnosis Date  . Acid reflux 10/19/2014  . Arthritis    arms  . Back pain, chronic 10/19/2014  . Dysphagia 10/20/2014  . Elevated BP 10/20/2014  . Fibroids, intramural 10/19/2014  . FOM (frequency of micturition) 10/19/2014  . GERD (gastroesophageal reflux disease)   . 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 LBP (low back pain) 10/19/2014    Surgical History: Past Surgical History:  Procedure Laterality Date  . ESOPHAGOGASTRODUODENOSCOPY (EGD) WITH PROPOFOL N/A 11/20/2014   Procedure: ESOPHAGOGASTRODUODENOSCOPY (EGD) WITH PROPOFOL with dilation;  Surgeon: Lucilla Lame, MD;  Location: [facility];  Service: Endoscopy;  Laterality: N/A;  . TUBAL LIGATION      Home Medications:  Allergies as of 02/28/2017   No Known Allergies     Medication List       Accurate as of 02/28/17  9:17 AM. Always use your most recent med list.          hydrochlorothiazide 25 MG tablet Commonly known as:  HYDRODIURIL Take 1 tablet (25 mg total) by mouth daily.   losartan 50 MG tablet Commonly known as:  COZAAR Take 1 tablet (50 mg total) by mouth daily.   omeprazole 40 MG capsule Commonly known as:  PRILOSEC Take 1 capsule (40 mg total) by mouth every morning.   ranitidine 75 MG tablet Commonly known as:  ZANTAC Take 1 tablet (75 mg total) by mouth 2 (two) times daily.       Allergies: No Known Allergies  Family History: Family History  Problem Relation Age of Onset  . Diabetes Father        in her 52 and 14s  . Hypertension Daughter        her 29s  . Kidney cancer Neg Hx   . Bladder Cancer Neg Hx     Social History:   reports that she has never smoked. She has never used smokeless tobacco. She reports that she does not drink alcohol or use drugs.  ROS:                                        PE: There were no vitals taken for this visit. NED. A&Ox3.   No respiratory distress   Abd soft, NT, ND Normal external genitalia with patent urethral meatus  Cystoscopy Procedure Note  Patient identification was confirmed, informed consent was obtained, and patient was prepped using Betadine solution.  Lidocaine jelly was administered per urethral meatus.    Preoperative abx where received prior to procedure.  Judson Roch was chaperone.   Procedure: - Flexible cystoscope introduced, without any difficulty.   - Thorough search of the bladder revealed:    normal urethral meatus, small caruncle    normal urothelium    no stones    no ulcers     no tumors    no urethral polyps      no trabeculation    Urine very clear. No cloudiness.   - Ureteral orifices were normal in position and appearance.  Post-Procedure: - Patient  tolerated the procedure well  Urinalysis Lab Results  Component Value Date   SPECGRAV >1.030 (H) 01/04/2017   PHUR 5.5 01/04/2017   COLORU Yellow 01/04/2017   APPEARANCEUR Clear 01/04/2017   LEUKOCYTESUR Negative 01/04/2017   PROTEINUR 1+ (A) 01/04/2017   GLUCOSEU Negative 01/04/2017   KETONESU Negative 01/04/2017   RBCU 2+ (A) 01/04/2017   BILIRUBINUR Negative 01/04/2017   UUROB 0.2 01/04/2017   NITRITE Negative 01/04/2017    Lab Results  Component Value Date   LABMICR See below: 01/04/2017   WBCUA None seen 01/04/2017   RBCUA 3-10 (A) 01/04/2017   LABEPIT 0-10 01/04/2017   BACTERIA None seen 01/04/2017    Pertinent Imaging: CT No results found for this or any previous visit. No results found for this or any previous visit. No results found for this or any previous visit. No results found for this or any previous visit. No results found for this  or any previous visit. No results found for this or any previous visit. Results for orders placed during the hospital encounter of 01/13/17  CT HEMATURIA WORKUP   Narrative CLINICAL DATA:  Dysuria and urinary frequency. Microscopic hematuria  EXAM: CT ABDOMEN AND PELVIS WITHOUT AND WITH CONTRAST  TECHNIQUE: Multidetector CT imaging of the abdomen and pelvis was performed following the standard protocol before and following the bolus administration of intravenous contrast.  CONTRAST:  133mL ISOVUE-300 IOPAMIDOL (ISOVUE-300) INJECTION 61%  COMPARISON:  04/01/2014  FINDINGS: Lower chest: No acute abnormality.  Hepatobiliary: No focal liver abnormality is seen. No gallstones, gallbladder wall thickening, or biliary dilatation.  Pancreas: Unremarkable. No pancreatic ductal dilatation or surrounding inflammatory changes.  Spleen: Normal in size without focal abnormality.  Adrenals/Urinary Tract: The adrenal glands are both normal. Mild bilateral renal cortical scarring noted. No kidney stones identified. The ureters appear patent bilaterally. No ureteral calculi identified. No bladder calculi noted. No urinary tract lesions identified.  Stomach/Bowel: Stomach is within normal limits. Appendix appears normal. No evidence of bowel wall thickening, distention, or inflammatory changes.  Vascular/Lymphatic: Aortic atherosclerosis noted. No aneurysm. No enlarged upper abdominal lymph nodes.  Reproductive: Uterus and bilateral adnexa are unremarkable.  Other: Periumbilical hernia contains fat only.  Musculoskeletal: Degenerative disc disease noted. No suspicious bone lesions. Bilateral facet degenerative change is also noted within the lumbar spine.  IMPRESSION: 1. No acute findings within the abdomen or pelvis. 2. No urinary tract calculi or other explanation for patient's hematuria 3. Aortic atherosclerosis.   Electronically Signed   By: Kerby Moors M.D.   On:  01/13/2017 12:53    No results found for this or any previous visit.  Assessment & Plan:    1) MH - see in 1 year or sooner if issues. Benign evaluation.   2) Vaginal atrophy - discussed the nature r/b/a to TV estrogen and FDA warnings on blood clots, heart attacks. Discussed how estrogen relates to breast ca. She'll consider.   There are no diagnoses linked to this encounter.  No Follow-up on file.  Festus Aloe, [facility] [address], [place], [place] [address] [phone]

## 2017-03-10 ENCOUNTER — Ambulatory Visit: Payer: Commercial Managed Care - HMO | Admitting: Nurse Practitioner

## 2017-03-13 ENCOUNTER — Encounter: Payer: Self-pay | Admitting: Nurse Practitioner

## 2017-03-13 ENCOUNTER — Ambulatory Visit (INDEPENDENT_AMBULATORY_CARE_PROVIDER_SITE_OTHER): Payer: Medicare HMO | Admitting: Nurse Practitioner

## 2017-03-13 VITALS — BP 101/68 | HR 82 | Temp 97.8°F | Ht <= 58 in | Wt 193.0 lb

## 2017-03-13 DIAGNOSIS — K2100 Gastro-esophageal reflux disease with esophagitis, without bleeding: Secondary | ICD-10-CM

## 2017-03-13 DIAGNOSIS — E7800 Pure hypercholesterolemia, unspecified: Secondary | ICD-10-CM | POA: Diagnosis not present

## 2017-03-13 DIAGNOSIS — M546 Pain in thoracic spine: Secondary | ICD-10-CM

## 2017-03-13 DIAGNOSIS — I700 Atherosclerosis of aorta: Secondary | ICD-10-CM | POA: Diagnosis not present

## 2017-03-13 DIAGNOSIS — R5383 Other fatigue: Secondary | ICD-10-CM | POA: Diagnosis not present

## 2017-03-13 DIAGNOSIS — Z1329 Encounter for screening for other suspected endocrine disorder: Secondary | ICD-10-CM | POA: Diagnosis not present

## 2017-03-13 DIAGNOSIS — I10 Essential (primary) hypertension: Secondary | ICD-10-CM

## 2017-03-13 LAB — LIPID PANEL
Cholesterol: 297 mg/dL — ABNORMAL HIGH (ref <200)
HDL: 81 mg/dL (ref >50)
LDL Cholesterol (Calc): 189 mg/dL (calc) — ABNORMAL HIGH
Non-HDL Cholesterol (Calc): 216 mg/dL (calc) — ABNORMAL HIGH (ref <130)
Total CHOL/HDL Ratio: 3.7 (calc) (ref <5.0)
Triglycerides: 135 mg/dL (ref <150)

## 2017-03-13 LAB — TSH: TSH: 1.29 mIU/L (ref 0.40–4.50)

## 2017-03-13 MED ORDER — OMEPRAZOLE 20 MG PO CPDR: 20.0000 mg | DELAYED_RELEASE_CAPSULE | Freq: Two times a day (BID) | ORAL | 3 refills | Status: DC

## 2017-03-13 NOTE — Patient Instructions (Addendum)
Tracey Carroll, Thank you for coming in to clinic today.  1. For your urology workup: - There is one test remaining for a cystoscopy (a bladder scope).  Please call [facility] to try and reschedule this test. [phone]  2. There are no changes to your EKG.  This is great news!  3. For your back pain start taking Tylenol extra strength 1 to 2 tablets every 6-8 hours for aches or fever/chills for next few days as needed.  Do not take more than 3,000 mg in 24 hours from all medicines.   - Use heat and ice.  Apply this for 15 minutes at a time 6-8 times per day.   - Muscle rub with lidocaine.  Avoid using this with heat and ice to avoid burns.   IF pain does not go away with rest and treating your pain, we may need more heart tests.  Please schedule a follow-up appointment with Cassell Smiles, AGNP. No Follow-up on file.  If you have any other questions or concerns, please feel free to call the clinic or send a message through [facility]. You may also schedule an earlier appointment if necessary.  You will receive a survey after today's visit either digitally by e-mail or paper by [employer]. Your experiences and feedback matter to [place].  Please respond so we know how we are doing as we provide care for you.   Cassell Smiles, DNP, AGNP-BC Adult Gerontology Nurse Practitioner [place]

## 2017-03-13 NOTE — Progress Notes (Signed)
Subjective:    Patient ID: Tracey Carroll, female    DOB: [birth year], 76 y.o.   MRN: [id number]  Tracey Carroll is a 76 y.o. female presenting on 03/13/2017 for Hypertension and Back Pain (center of the back)   HPI Hypertension - She is checking BP at home or outside of clinic.  Readings Never > 150/90, occasional 100 SBP, usually 110-120,  and highest 138 - Current medications: hydrochlorothiazide 25 mg once daily and losartan 50 mg once daily, tolerating well without side effects - She is not currently symptomatic. - Pt denies headache, lightheadedness, dizziness, changes in vision, chest tightness/pressure, palpitations, leg swelling, sudden loss of speech or loss of consciousness. - She  reports no regular exercise routine. - Her diet is moderate in salt, moderate in fat, and moderate in carbohydrates.   Acid Reflux Notes worse symptoms at night after evening meal. - Sometimes it feels like some foods get stuck.  Esophagus stretched.   - Occasionally has difficulty swallowing (food doesn't go down).   - Occasionally coughs, but not regularly. Symptoms seem about the same as 1 year ago.  Upper back pain  Pain is described as tightness in her back in center between shoulder blades.   Does not prevent activity.  Comes and goes.  Has had this about 2-3 days ago.  Carried her trash out (rolled to the curb)and then felt this pain.  Pain went away after resting.  Pt also felt a little short of breath.  Does not always with activity, but usually with activity and is associated w/ shortness of breath.  This occasionally ocurrs at rest and resolved within several minutes. - Does not take medications for this pain.  Aortic Atherosclerosis Pt had atherosclerosis noted in aorta on imaging in 12/2016.  W/ back pain, cannot exclude ascvd event.   Social History  Substance Use Topics  . Smoking status: Never Smoker  . Smokeless tobacco: Never Used  . Alcohol use No    Review of  Systems Per HPI unless specifically indicated above     Objective:    BP 101/68 (BP Location: Right Arm, Patient Position: Sitting, Cuff Size: Normal)   Pulse 82   Temp 97.8 F (36.6 C) (Oral)   Ht 4\' 9"  (1.448 m)   Wt 193 lb (87.5 kg)   BMI 41.76 kg/m   Wt Readings from Last 3 Encounters:  03/13/17 193 lb (87.5 kg)  02/28/17 192 lb (87.1 kg)  01/17/17 193 lb 9.6 oz (87.8 kg)    Physical Exam  General - obese, well-appearing, NAD HEENT - Normocephalic, atraumatic Neck - supple, non-tender, no LAD, no thyromegaly, no carotid bruit Heart - RRR, no murmurs heard Lungs - Clear throughout all lobes, no wheezing, crackles, or rhonchi. Normal work of breathing. Abdomen - soft, NTND, no masses, no hepatosplenomegaly, active bowel sounds  Musculoskeletal - Thoracic Back Inspection: Normal appearance, Large body habitus, no spinal deformity, symmetrical. Palpation: No tenderness over spinous processes. Bilateral thoracic paraspinal muscles nontender and without hypertonicity/spasm.  Pain not reproducible ROM: Full active ROM forward flex / back extension, rotation L/R without discomfort Special Testing: none Strength: Bilateral hip flex/ext 5/5, knee flex/ext 5/5, ankle dorsiflex/plantarflex 5/5 Neurovascular: intact distal sensation to light touch Extremeties - non-tender, no edema, cap refill < 2 seconds, peripheral pulses intact +2 bilaterally Skin - warm, dry Neuro - awake, alert, oriented x3, normal gait Psych - Normal mood and affect, normal behavior    Results for orders placed or performed  in visit on 03/13/17  Lipid panel  Result Value Ref Range   Cholesterol 297 (H) <200 mg/dL   HDL 81 >50 mg/dL   Triglycerides 135 <150 mg/dL   LDL Cholesterol (Calc) 189 (H) mg/dL (calc)   Total CHOL/HDL Ratio 3.7 <5.0 (calc)   Non-HDL Cholesterol (Calc) 216 (H) <130 mg/dL (calc)  TSH  Result Value Ref Range   TSH 1.29 0.40 - 4.50 mIU/L   EKG 03/13/17: NSR w/ LBBB HR 78 Changed to  NSR from Afib since last ECG.  No other significant changes.     Assessment & Plan:   Problem List Items Addressed This Visit      Cardiovascular and Mediastinum   HBP (high blood pressure) - Primary    Controlled hypertension.  BP at goal of less than 130/80 today.  Pt is working on lifestyle modifications.  Taking medications tolerating well without side effects.   Plan: 1. Continue lisinopril 25 mg once daily. 2. Kidney function continues to be good w/ medication at last check. 3. Encouraged heart healthy diet and increasing exercise. 4. Check BP 1-2 x per week at home, keep log, and bring to clinic at next appointment. 5. Follow up 6 months or sooner if needed.      Aortic atherosclerosis ([place])    Previously asymptomatic w/ atherosclerosis noted on imaging 12/2016.  Pt symptomatic w/ thoracic back pain worst w/ exertion and relieved w/ rest.  Cannot exclude ASCVD event.  Plan: 1. Obtain lipid panel for prevention of ASCVD events w/ treatment. - Has has coffee w/ cream/sugar today.  - Has not had a recent lipid screening and is not on medications for cholesterol. 2. Obtain EKG today evaluate for changes. 3. Pt to continue to monitor symptoms. 4. Follow up as needed.      Relevant Orders   Lipid panel (Completed)     Digestive   Acid reflux    Uncontrolled.  Pt w/ breakthrough symptoms w/ omeprazole 40 mg once daily.  Did not have any additional relief w/ addition of ranitidine.  Plan: 1. STOP ranitidine 75 mg bid 2. CHANGE omeprazole.  Take 20 mg bidac.  Consider GI referral if no resolution of symptoms. 3. Follow up 6 months or sooner if needed.      Relevant Medications   omeprazole (PRILOSEC) 20 MG capsule    Other Visit Diagnoses    Acute midline thoracic back pain       Pt w/ exertional back pain.  Possible and most likely diagnosis is muscle strain, however cannot exclude MI from possible diagnoses.  Pt does have some paraspinal tenderness.  Plan: 1. Obtain  EKG today: NSR w/ LBBB - prior EKG was afib.  No other changes.  Cannot exclude MI since pre-existing LBBB on prior Xray, but pt is currently asymptomatic.  If symptoms persist, recommend pt to seek care in ED via EMS if pain does not resolve w/ rest.  May also consider outpatient cardiology referral.   Discussed possibility of back pain being cardiac ischemia w/ pt. 2. Begin w/ treatment of musculoskeletal injury.  Pain may be self-limited if msk injury.   - Treat with NSAIDs (acetaminophen and ibuprofen).  Discussed alternate dosing and max dosing. - Apply heat and/or ice to affected area. -  May also apply a muscle rub with lidocaine after heat or ice.   Relevant Orders   EKG 12-Lead   Screening for thyroid disorder       Pt w/ hypertension.  Pt  also desires screening for thyroid disorder.  Plan: 1. Check TSH today.   Relevant Orders   TSH (Completed)      Meds ordered this encounter  Medications  . omeprazole (PRILOSEC) 20 MG capsule    Sig: Take 1 capsule (20 mg total) by mouth 2 (two) times daily.    Dispense:  180 capsule    Refill:  3    Order Specific Question:   Supervising Provider    Answer:   Olin Hauser [[id number]]      Follow up plan: Return in about 6 months (around 09/11/2017) for Hypertension, GERD.  Cassell Smiles, DNP, AGPCNP-BC Adult Gerontology Primary Care Nurse Practitioner [facility] 03/15/2017, 5:05 PM

## 2017-03-13 NOTE — Assessment & Plan Note (Addendum)
Previously asymptomatic w/ atherosclerosis noted on imaging 12/2016.  Pt symptomatic w/ thoracic back pain worst w/ exertion and relieved w/ rest.  Cannot exclude ASCVD event.  Plan: 1. Obtain lipid panel for prevention of ASCVD events w/ treatment. - Has has coffee w/ cream/sugar today.  - Has not had a recent lipid screening and is not on medications for cholesterol. 2. Obtain EKG today evaluate for changes. 3. Pt to continue to monitor symptoms. 4. Follow up as needed.

## 2017-03-15 NOTE — Assessment & Plan Note (Signed)
Controlled hypertension.  BP at goal of less than 130/80 today.  Pt is working on lifestyle modifications.  Taking medications tolerating well without side effects.   Plan: 1. Continue lisinopril 25 mg once daily. 2. Kidney function continues to be good w/ medication at last check. 3. Encouraged heart healthy diet and increasing exercise. 4. Check BP 1-2 x per week at home, keep log, and bring to clinic at next appointment. 5. Follow up 6 months or sooner if needed.

## 2017-03-15 NOTE — Assessment & Plan Note (Signed)
Uncontrolled.  Pt w/ breakthrough symptoms w/ omeprazole 40 mg once daily.  Did not have any additional relief w/ addition of ranitidine.  Plan: 1. STOP ranitidine 75 mg bid 2. CHANGE omeprazole.  Take 20 mg bidac.  Consider GI referral if no resolution of symptoms. 3. Follow up 6 months or sooner if needed.

## 2017-03-16 MED ORDER — ATORVASTATIN CALCIUM 20 MG PO TABS: 20.0000 mg | ORAL_TABLET | Freq: Every day | ORAL | 3 refills | Status: DC

## 2017-03-16 NOTE — Addendum Note (Signed)
Addended by: Cassell Smiles R on: 03/16/2017 05:01 PM   Modules accepted: Orders

## 2017-03-17 DIAGNOSIS — B351 Tinea unguium: Secondary | ICD-10-CM | POA: Diagnosis not present

## 2017-03-17 DIAGNOSIS — M79609 Pain in unspecified limb: Secondary | ICD-10-CM | POA: Diagnosis not present

## 2017-05-30 ENCOUNTER — Other Ambulatory Visit: Payer: Self-pay

## 2017-05-30 DIAGNOSIS — E7800 Pure hypercholesterolemia, unspecified: Secondary | ICD-10-CM

## 2017-05-30 MED ORDER — ATORVASTATIN CALCIUM 20 MG PO TABS: 20.0000 mg | ORAL_TABLET | Freq: Every day | ORAL | 3 refills | Status: DC

## 2017-06-05 ENCOUNTER — Other Ambulatory Visit: Payer: Self-pay

## 2017-06-05 ENCOUNTER — Telehealth: Payer: Self-pay | Admitting: Nurse Practitioner

## 2017-06-05 DIAGNOSIS — I10 Essential (primary) hypertension: Secondary | ICD-10-CM

## 2017-06-05 MED ORDER — HYDROCHLOROTHIAZIDE 25 MG PO TABS: 25.0000 mg | ORAL_TABLET | Freq: Every day | ORAL | 0 refills | Status: DC

## 2017-06-05 NOTE — Telephone Encounter (Signed)
Rx send pt is aware.

## 2017-06-05 NOTE — Telephone Encounter (Signed)
Pt. Called requesting a refill on Hydrodiuril  25 mg,  [facility] mail order

## 2017-06-07 ENCOUNTER — Other Ambulatory Visit: Payer: Self-pay

## 2017-06-07 DIAGNOSIS — E7800 Pure hypercholesterolemia, unspecified: Secondary | ICD-10-CM

## 2017-06-07 DIAGNOSIS — I10 Essential (primary) hypertension: Secondary | ICD-10-CM

## 2017-06-13 ENCOUNTER — Other Ambulatory Visit: Payer: Self-pay

## 2017-06-13 ENCOUNTER — Encounter: Payer: Self-pay | Admitting: Nurse Practitioner

## 2017-06-13 ENCOUNTER — Ambulatory Visit (INDEPENDENT_AMBULATORY_CARE_PROVIDER_SITE_OTHER): Payer: Medicare HMO | Admitting: Nurse Practitioner

## 2017-06-13 VITALS — BP 105/69 | HR 79 | Temp 97.5°F | Ht <= 58 in | Wt 193.2 lb

## 2017-06-13 DIAGNOSIS — K219 Gastro-esophageal reflux disease without esophagitis: Secondary | ICD-10-CM

## 2017-06-13 DIAGNOSIS — E7800 Pure hypercholesterolemia, unspecified: Secondary | ICD-10-CM | POA: Insufficient documentation

## 2017-06-13 DIAGNOSIS — E441 Mild protein-calorie malnutrition: Secondary | ICD-10-CM | POA: Diagnosis not present

## 2017-06-13 DIAGNOSIS — I10 Essential (primary) hypertension: Secondary | ICD-10-CM

## 2017-06-13 DIAGNOSIS — E782 Mixed hyperlipidemia: Secondary | ICD-10-CM | POA: Diagnosis not present

## 2017-06-13 DIAGNOSIS — M7989 Other specified soft tissue disorders: Secondary | ICD-10-CM | POA: Diagnosis not present

## 2017-06-13 DIAGNOSIS — I700 Atherosclerosis of aorta: Secondary | ICD-10-CM

## 2017-06-13 LAB — LIPID PANEL
Cholesterol: 178 mg/dL (ref <200)
HDL: 78 mg/dL (ref >50)
LDL Cholesterol (Calc): 83 mg/dL (calc)
Non-HDL Cholesterol (Calc): 100 mg/dL (calc) (ref <130)
Total CHOL/HDL Ratio: 2.3 (calc) (ref <5.0)
Triglycerides: 78 mg/dL (ref <150)

## 2017-06-13 LAB — COMPREHENSIVE METABOLIC PANEL
AG Ratio: 1.4 (calc) (ref 1.0–2.5)
ALT: 11 U/L (ref 6–29)
AST: 18 U/L (ref 10–35)
Albumin: 4.2 g/dL (ref 3.6–5.1)
Alkaline phosphatase (APISO): 62 U/L (ref 33–130)
BUN: 10 mg/dL (ref 7–25)
CO2: 30 mmol/L (ref 20–32)
Calcium: 9.4 mg/dL (ref 8.6–10.4)
Chloride: 103 mmol/L (ref 98–110)
Creat: 0.75 mg/dL (ref 0.60–0.93)
Globulin: 2.9 g/dL (calc) (ref 1.9–3.7)
Glucose, Bld: 103 mg/dL — ABNORMAL HIGH (ref 65–99)
Potassium: 3.7 mmol/L (ref 3.5–5.3)
Sodium: 138 mmol/L (ref 135–146)
Total Bilirubin: 0.7 mg/dL (ref 0.2–1.2)
Total Protein: 7.1 g/dL (ref 6.1–8.1)

## 2017-06-13 MED ORDER — UNJURY VANILLA POWDER: 2.0000 [oz_av] | Freq: Two times a day (BID) | ORAL | 1 refills | Status: DC

## 2017-06-13 NOTE — Assessment & Plan Note (Signed)
Stable, but not complete control of symptoms.  Pt w/ breakthrough symptoms w/ omeprazole 40 mg once daily.  Did not have any additional relief w/ addition of ranitidine or change of omeprazole from 40 mg daily to 20 mg bid.  Suspect is more likely related to her esophageal stricture.  Plan: 1. Continue omeprazole 20 mg bidac.  Consider GI referral if any additional worsening of symptoms for further eval of esophageal stricture. 2. Followup 3 months.

## 2017-06-13 NOTE — Progress Notes (Signed)
Subjective:    Patient ID: Tracey Carroll, female    DOB: 1940-11-06, 77 y.o.   MRN: [id number]  Tracey Carroll is a 77 y.o. female presenting on 06/13/2017 for Hypertension   HPI Hypertension - She is checking BP at home or outside of clinic.  Readings 128/89 - Current medications: losartan and hydrochlorothiazide, tolerating well without side effects - She is not currently symptomatic. - Pt denies headache, lightheadedness, dizziness, changes in vision, chest tightness/pressure, palpitations, leg swelling, sudden loss of speech or loss of consciousness.  In last 3 months, pt reports only one day of dizziness/drowsiness.   - She  reports no regular exercise routine. - Her diet is low in salt, low in fat, and moderate in carbohydrates.  Has avoided sodas and sweets.    Hyperlipidemia Pt was started on atorvastatin 20 mg once daily at last visit for hyperlipidemia with documented aortic atherosclerosis.  She reports tolerating medication well without any myalgias or arthralgias.  She notes no other side effects. - Pt denies changes in vision, chest tightness/pressure, palpitations, shortness of breath, leg pain while walking, leg or arm weakness, and sudden loss of speech or loss of consciousness.  Protein Malnutrition She notes it is hard to get enough protein because she has very few teeth.  She was told she will need surgery on her bone prior to dentures, so is trying to seek second opinion.  She limits her options to very soft foods which are often not protein nutritious foods.  GERD No current heartburn, but still has some sensation of globus sensation.  Dividing dose of omeprazole to 20 mg twice daily has not changed her symptoms.  Her symptoms of also not gotten any worse.  She notes initial symptoms started after she retired and gained weight.  In 2016 she weighed approximately to 175 pounds.  Over the last 6 months to a year she has maintained a stable weight, but has not been  able to lose weight.  She avoids alcohol, large meals, eating before bed. - Pt also has known esophageal stricture and is most likely cause of globus sensation.  Symptoms are not worsening.  Social History   Tobacco Use  . Smoking status: Never Smoker  . Smokeless tobacco: Never Used  Substance Use Topics  . Alcohol use: No    Alcohol/week: 0.0 oz  . Drug use: No    Review of Systems Per HPI unless specifically indicated above     Objective:    BP 105/69 (BP Location: Right Arm, Patient Position: Sitting, Cuff Size: Normal)   Pulse 79   Temp (!) 97.5 F (36.4 C) (Oral)   Ht 4\' 9"  (1.448 m)   Wt 193 lb 3.2 oz (87.6 kg)   BMI 41.81 kg/m   Wt Readings from Last 3 Encounters:  06/13/17 193 lb 3.2 oz (87.6 kg)  03/13/17 193 lb (87.5 kg)  02/28/17 192 lb (87.1 kg)    Physical Exam  General - morbidly obese, well-appearing, NAD HEENT - Normocephalic, atraumatic Neck - supple, non-tender, no LAD, no thyromegaly, no carotid bruit Heart - RRR, no murmurs heard Lungs - Clear throughout all lobes, no wheezing, crackles, or rhonchi. Normal work of breathing. Abdomen - soft, NTND, no masses, no hepatosplenomegaly, active bowel sounds Extremeties - non-tender, +2 non-pitting edema, cap refill < 2 seconds, peripheral pulses intact +2 bilaterally Skin - warm, dry Neuro - awake, alert, oriented x3, antalgic gait Psych - Normal mood and affect, normal behavior  Results for orders placed or performed in visit on 03/13/17  Lipid panel  Result Value Ref Range   Cholesterol 297 (H) <200 mg/dL   HDL 81 >50 mg/dL   Triglycerides 135 <150 mg/dL   LDL Cholesterol (Calc) 189 (H) mg/dL (calc)   Total CHOL/HDL Ratio 3.7 <5.0 (calc)   Non-HDL Cholesterol (Calc) 216 (H) <130 mg/dL (calc)  TSH  Result Value Ref Range   TSH 1.29 0.40 - 4.50 mIU/L      Assessment & Plan:   Problem List Items Addressed This Visit      Cardiovascular and Mediastinum   HBP (high blood pressure)     Controlled hypertension.  BP at goal of less than 130/80 today.  Pt is working on lifestyle modifications.  Taking medications tolerating well without side effects.   Plan: 1. Continue losartan 50 mg once daily and hydrochlorothiazide 25 mg once daily. 2. Kidney function continues to be good w/ medication at last check. 3. Encouraged heart healthy diet and increasing exercise. 4. Check BP 1-2 x per week at home, keep log, and bring to clinic at next appointment. 5. Follow up 3 months or sooner if needed.      Relevant Orders   Comprehensive metabolic panel   Aortic atherosclerosis ([place]) - Primary    Currently asymptomatic w/ atherosclerosis noted on imaging 12/2016.  Pt started is is tolerating atorvastatin well without side effects.  Plan: 1. Continue atorvastatin 20 mg once daily - Recheck labs today: CMP, Lipid evaluate med effect and possible adverse effects. 2. Follow up after labs and at least every 6 months.      Relevant Orders   Lipid panel   Comprehensive metabolic panel     Digestive   Acid reflux    Stable, but not complete control of symptoms.  Pt w/ breakthrough symptoms w/ omeprazole 40 mg once daily.  Did not have any additional relief w/ addition of ranitidine or change of omeprazole from 40 mg daily to 20 mg bid.  Suspect is more likely related to her esophageal stricture.  Plan: 1. Continue omeprazole 20 mg bidac.  Consider GI referral if any additional worsening of symptoms for further eval of esophageal stricture. 2. Followup 3 months.       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  Pt willing to try boost/ensure drinks and protein supplement powder.  Plan: 1. Protein supplement 2 x daily with meals or as snack.   2. Followup as needed.  - Pt may  consider 2nd opinion about dentures.   Relevant Medications   protein supplement (UNJURY VANILLA) POWD   Swelling of lower leg     Pt with dependent lower leg swelling not completely resolved after bedtime.  Suggestive of venous insufficiency.  Pt without prior use of compression stockings.  Symptoms are not bothersome to patient.  Plan: 1. START wearing compression stockings daily.  15 mmHg pressure. 2. Followup as needed.   Relevant Orders   Compression stockings      Meds ordered this encounter  Medications  . protein supplement (UNJURY VANILLA) POWD    Sig: Take 7 g (2 oz total) by mouth 2 (two) times daily.    Dispense:  1260 g    Refill:  1    Order Specific Question:   Supervising Provider    Answer:   Olin Hauser [[id number]]    Follow up plan: Return in about 3 months (around 09/11/2017) for Blood Pressure, Cholesterol, GERD.  Cassell Smiles, DNP, AGPCNP-BC Adult Gerontology Primary Care Nurse Practitioner [facility] 06/13/2017, 9:02 AM

## 2017-06-13 NOTE — Patient Instructions (Addendum)
Ms. Pulver, Thank you for coming in to clinic today.  1. Please get a pair of compression socks from [employer]. [address] [place], [place] [address]  Phone: [phone]  2. No medication changes today for blood pressure or cholesterol.  If you become dizzy or lightheaded regularly, please call clinic.  We may need to reduce your BP medication.  3. For your protein intake, Start protein powder 2x daily.  Please schedule a follow-up appointment with Cassell Smiles, AGNP. Return in about 3 months (around 09/11/2017) for Blood Pressure, Cholesterol, GERD.   If you have any other questions or concerns, please feel free to call the clinic or send a message through [place]. You may also schedule an earlier appointment if necessary.  You will receive a survey after today's visit either digitally by e-mail or paper by [employer]. Your experiences and feedback matter to [place].  Please respond so we know how we are doing as we provide care for you.   Cassell Smiles, DNP, AGNP-BC Adult Gerontology Nurse Practitioner [place]

## 2017-06-13 NOTE — Assessment & Plan Note (Signed)
Currently asymptomatic w/ atherosclerosis noted on imaging 12/2016.  Pt started is is tolerating atorvastatin well without side effects.  Plan: 1. Continue atorvastatin 20 mg once daily - Recheck labs today: CMP, Lipid evaluate med effect and possible adverse effects. 2. Follow up after labs and at least every 6 months.

## 2017-06-13 NOTE — Assessment & Plan Note (Signed)
Controlled hypertension.  BP at goal of less than 130/80 today.  Pt is working on lifestyle modifications.  Taking medications tolerating well without side effects.   Plan: 1. Continue losartan 50 mg once daily and hydrochlorothiazide 25 mg once daily. 2. Kidney function continues to be good w/ medication at last check. 3. Encouraged heart healthy diet and increasing exercise. 4. Check BP 1-2 x per week at home, keep log, and bring to clinic at next appointment. 5. Follow up 3 months or sooner if needed.

## 2017-06-19 ENCOUNTER — Other Ambulatory Visit: Payer: Self-pay | Admitting: Family Medicine

## 2017-06-19 DIAGNOSIS — I10 Essential (primary) hypertension: Secondary | ICD-10-CM

## 2017-08-07 ENCOUNTER — Other Ambulatory Visit: Payer: Self-pay | Admitting: Nurse Practitioner

## 2017-08-07 DIAGNOSIS — I10 Essential (primary) hypertension: Secondary | ICD-10-CM

## 2017-09-11 ENCOUNTER — Other Ambulatory Visit: Payer: Self-pay

## 2017-09-11 ENCOUNTER — Encounter: Payer: Self-pay | Admitting: Nurse Practitioner

## 2017-09-11 ENCOUNTER — Ambulatory Visit (INDEPENDENT_AMBULATORY_CARE_PROVIDER_SITE_OTHER): Payer: Medicare HMO | Admitting: Nurse Practitioner

## 2017-09-11 VITALS — BP 122/64 | Temp 98.0°F | Ht <= 58 in | Wt 193.2 lb

## 2017-09-11 DIAGNOSIS — I10 Essential (primary) hypertension: Secondary | ICD-10-CM

## 2017-09-11 DIAGNOSIS — R739 Hyperglycemia, unspecified: Secondary | ICD-10-CM

## 2017-09-11 DIAGNOSIS — K219 Gastro-esophageal reflux disease without esophagitis: Secondary | ICD-10-CM | POA: Diagnosis not present

## 2017-09-11 DIAGNOSIS — E782 Mixed hyperlipidemia: Secondary | ICD-10-CM | POA: Diagnosis not present

## 2017-09-11 DIAGNOSIS — R7309 Other abnormal glucose: Secondary | ICD-10-CM | POA: Diagnosis not present

## 2017-09-11 DIAGNOSIS — I700 Atherosclerosis of aorta: Secondary | ICD-10-CM

## 2017-09-11 LAB — COMPLETE METABOLIC PANEL WITH GFR
AG Ratio: 1.4 (calc) (ref 1.0–2.5)
ALT: 8 U/L (ref 6–29)
AST: 19 U/L (ref 10–35)
Albumin: 4.2 g/dL (ref 3.6–5.1)
Alkaline phosphatase (APISO): 56 U/L (ref 33–130)
BUN: 10 mg/dL (ref 7–25)
CO2: 31 mmol/L (ref 20–32)
Calcium: 9.6 mg/dL (ref 8.6–10.4)
Chloride: 102 mmol/L (ref 98–110)
Creat: 0.81 mg/dL (ref 0.60–0.93)
GFR, Est African American: 83 mL/min/{1.73_m2} (ref >=60)
GFR, Est Non African American: 72 mL/min/{1.73_m2} (ref >=60)
Globulin: 3 g/dL (calc) (ref 1.9–3.7)
Glucose, Bld: 91 mg/dL (ref 65–99)
Potassium: 3.8 mmol/L (ref 3.5–5.3)
Sodium: 140 mmol/L (ref 135–146)
Total Bilirubin: 0.8 mg/dL (ref 0.2–1.2)
Total Protein: 7.2 g/dL (ref 6.1–8.1)

## 2017-09-11 LAB — LIPID PANEL
Cholesterol: 169 mg/dL (ref <200)
HDL: 65 mg/dL (ref >50)
LDL Cholesterol (Calc): 87 mg/dL (calc)
Non-HDL Cholesterol (Calc): 104 mg/dL (calc) (ref <130)
Total CHOL/HDL Ratio: 2.6 (calc) (ref <5.0)
Triglycerides: 81 mg/dL (ref <150)

## 2017-09-11 MED ORDER — LOSARTAN POTASSIUM 50 MG PO TABS: 50.0000 mg | ORAL_TABLET | Freq: Every day | ORAL | 3 refills | Status: DC

## 2017-09-11 MED ORDER — OMEPRAZOLE 40 MG PO CPDR
40.0000 mg | DELAYED_RELEASE_CAPSULE | Freq: Every morning | ORAL | 3 refills | Status: DC
Start: 2017-09-11 — End: 2018-03-12

## 2017-09-11 MED ORDER — HYDROCHLOROTHIAZIDE 25 MG PO TABS: 25.0000 mg | ORAL_TABLET | Freq: Every day | ORAL | 1 refills | Status: DC

## 2017-09-11 NOTE — Patient Instructions (Addendum)
Tracey Carroll,   Thank you for coming in to clinic today.  1. No medication changes today.  2. We will call with your lab results once they come back.  Probably in about 2 days.  Please schedule a follow-up appointment with Cassell Smiles, AGNP. Return in about 6 months (around 03/13/2018).  If you have any other questions or concerns, please feel free to call the clinic or send a message through [place]. You may also schedule an earlier appointment if necessary.  You will receive a survey after today's visit either digitally by e-mail or paper by [employer]. Your experiences and feedback matter to [place].  Please respond so we know how we are doing as we provide care for you.   Cassell Smiles, DNP, AGNP-BC Adult Gerontology Nurse Practitioner [place]

## 2017-09-11 NOTE — Progress Notes (Signed)
Subjective:    Patient ID: Tracey Carroll, female    DOB: 22-Aug-1940, 77 y.o.   MRN: [id number]  Tracey Carroll is a 77 y.o. female presenting on 09/11/2017 for Hypertension   HPI Hypertension - She is checking BP at home or outside of clinic.  Readings lowest 108/80, Usually 120/80s - Current medications: hydrochlorothaizide 25 mg daily and losartan 50 mg daily, tolerating well without side effects - She is not currently symptomatic. - Pt denies headache, lightheadedness, dizziness, changes in vision, chest tightness/pressure, palpitations, leg swelling, sudden loss of speech or loss of consciousness.  Hyperlipidemia Pt has started taking atorvastatin 20 mg once daily as directed after last visit for documented ascvd with aortic atherosclerosis.  She is tolerating well without side effects. Pt denies changes in vision, chest tightness/pressure, palpitations, shortness of breath, leg pain while walking, leg or arm weakness, and sudden loss of speech or loss of consciousness.   GERD Had heartburn last night for first time in several months.  Ate a late meal last night.  Usually eats last meal much earlier in the day and has no heartburn symptoms. -Pt is taking omeprazole 40 mg once daily and is tolerating well.   Social History   Tobacco Use  . Smoking status: Never Smoker  . Smokeless tobacco: Never Used  Substance Use Topics  . Alcohol use: No    Alcohol/week: 0.0 oz  . Drug use: No    Review of Systems Per HPI unless specifically indicated above     Objective:    BP 122/64 (BP Location: Right Arm, Patient Position: Sitting, Cuff Size: Large)   Temp 98 F (36.7 C) (Oral)   Ht 4\' 9"  (1.448 m)   Wt 193 lb 3.2 oz (87.6 kg)   BMI 41.81 kg/m   Wt Readings from Last 3 Encounters:  09/11/17 193 lb 3.2 oz (87.6 kg)  06/13/17 193 lb 3.2 oz (87.6 kg)  03/13/17 193 lb (87.5 kg)    Physical Exam  Constitutional: She is oriented to person, place, and time. She appears  well-developed and well-nourished. No distress.  HENT:  Head: Normocephalic and atraumatic.  Neck: Normal range of motion. Neck supple. Carotid bruit is not present.  Cardiovascular: Normal rate, regular rhythm, S1 normal, S2 normal, normal heart sounds and intact distal pulses.  Pulmonary/Chest: Effort normal and breath sounds normal. No respiratory distress.  Musculoskeletal: She exhibits no edema (pedal).  Neurological: She is alert and oriented to person, place, and time.  Skin: Skin is warm and dry.  Psychiatric: She has a normal mood and affect. Her behavior is normal.  Vitals reviewed.    Results for orders placed or performed in visit on 09/11/17  COMPLETE METABOLIC PANEL WITH GFR  Result Value Ref Range   Glucose, Bld 91 65 - 99 mg/dL   BUN 10 7 - 25 mg/dL   Creat 0.81 0.60 - 0.93 mg/dL   GFR, Est Non African American 72 > OR = 60 mL/min/1.93m2   GFR, Est African American 83 > OR = 60 mL/min/1.57m2   BUN/Creatinine Ratio NOT APPLICABLE 6 - 22 (calc)   Sodium 140 135 - 146 mmol/L   Potassium 3.8 3.5 - 5.3 mmol/L   Chloride 102 98 - 110 mmol/L   CO2 31 20 - 32 mmol/L   Calcium 9.6 8.6 - 10.4 mg/dL   Total Protein 7.2 6.1 - 8.1 g/dL   Albumin 4.2 3.6 - 5.1 g/dL   Globulin 3.0 1.9 - 3.7 g/dL (  calc)   AG Ratio 1.4 1.0 - 2.5 (calc)   Total Bilirubin 0.8 0.2 - 1.2 mg/dL   Alkaline phosphatase (APISO) 56 33 - 130 U/L   AST 19 10 - 35 U/L   ALT 8 6 - 29 U/L  Lipid panel  Result Value Ref Range   Cholesterol 169 <200 mg/dL   HDL 65 >50 mg/dL   Triglycerides 81 <150 mg/dL   LDL Cholesterol (Calc) 87 mg/dL (calc)   Total CHOL/HDL Ratio 2.6 <5.0 (calc)   Non-HDL Cholesterol (Calc) 104 <130 mg/dL (calc)  Hemoglobin A1c  Result Value Ref Range   Hgb A1c MFr Bld 5.2 <5.7 % of total Hgb   Mean Plasma Glucose 103 (calc)   eAG (mmol/L) 5.7 (calc)      Assessment & Plan:   Problem List Items Addressed This Visit      Cardiovascular and Mediastinum   HBP (high blood  pressure)    Controlled hypertension.  BP at goal of less than 130/80 today.  Pt is working on lifestyle modifications.  Taking medications tolerating well without side effects.   Plan: 1. Continue losartan 50 mg once daily and hydrochlorothiazide 25 mg once daily. 2. Kidney function continues to be good w/ medication at last check. Due for recheck again. 3. Encouraged heart healthy diet and increasing exercise. 4. Check BP 1-2 x per week at home, keep log, and bring to clinic at next appointment. 5. Follow up 6 months or sooner if needed.      Relevant Medications   hydrochlorothiazide (HYDRODIURIL) 25 MG tablet   losartan (COZAAR) 50 MG tablet   Other Relevant Orders   COMPLETE METABOLIC PANEL WITH GFR (Completed)   Lipid panel (Completed)   Aortic atherosclerosis ([place]) - Primary    Currently asymptomatic w/ atherosclerosis noted on imaging 12/2016.  Pt started is continuing to tolerating atorvastatin well without side effects.  Lipids continue to be at goal with therapy.  Plan: 1. Continue atorvastatin 20 mg once daily - Recheck labs today: CMP, Lipid evaluate med effect and possible adverse effects. 2. Follow up every 6 months.        Relevant Medications   hydrochlorothiazide (HYDRODIURIL) 25 MG tablet   losartan (COZAAR) 50 MG tablet   Other Relevant Orders   COMPLETE METABOLIC PANEL WITH GFR (Completed)   Lipid panel (Completed)     Digestive   Acid reflux    Stable.  However, continues to have intermittent breakthrough symptoms.  Breakthrough symptoms are lessening in frequency and severity.  BID dosing did not help in past.  Pt has returned to daily dosing of omeprazole at 40 mg once daily.  Suspect is more likely related to her esophageal stricture.  Dietary triggers are also a component.  Plan: 1. Resume omeprazole 40 mg daily.  Consider GI referral if any additional worsening of symptoms for further eval of esophageal stricture. 2. Followup 6 months.      Relevant  Medications   omeprazole (PRILOSEC) 40 MG capsule     Other   Mixed hyperlipidemia    Stable and at goal.  See treatment above for Aortic ASCVD      Relevant Medications   hydrochlorothiazide (HYDRODIURIL) 25 MG tablet   losartan (COZAAR) 50 MG tablet   Other Relevant Orders   COMPLETE METABOLIC PANEL WITH GFR (Completed)   Lipid panel (Completed)    Other Visit Diagnoses    Elevated random blood glucose level       Relevant Orders  Hemoglobin A1c (Completed)      Meds ordered this encounter  Medications  . hydrochlorothiazide (HYDRODIURIL) 25 MG tablet    Sig: Take 1 tablet (25 mg total) by mouth daily.    Dispense:  90 tablet    Refill:  1    Order Specific Question:   Supervising Provider    Answer:   Olin Hauser [[id number]]  . losartan (COZAAR) 50 MG tablet    Sig: Take 1 tablet (50 mg total) by mouth daily.    Dispense:  90 tablet    Refill:  3    Order Specific Question:   Supervising Provider    Answer:   Olin Hauser [[id number]]  . omeprazole (PRILOSEC) 40 MG capsule    Sig: Take 1 capsule (40 mg total) by mouth every morning.    Dispense:  90 capsule    Refill:  3    Order Specific Question:   Supervising Provider    Answer:   Olin Hauser [[id number]]    Follow up plan: Return in about 6 months (around 03/13/2018).  Cassell Smiles, DNP, AGPCNP-BC Adult Gerontology Primary Care Nurse Practitioner [facility] 09/13/2017, 6:01 PM

## 2017-09-12 LAB — HEMOGLOBIN A1C
Hgb A1c MFr Bld: 5.2 % of total Hgb (ref <5.7)
Mean Plasma Glucose: 103 (calc)
eAG (mmol/L): 5.7 (calc)

## 2017-09-13 ENCOUNTER — Encounter: Payer: Self-pay | Admitting: Nurse Practitioner

## 2017-09-13 NOTE — Assessment & Plan Note (Signed)
Currently asymptomatic w/ atherosclerosis noted on imaging 12/2016.  Pt started is continuing to tolerating atorvastatin well without side effects.  Lipids continue to be at goal with therapy.  Plan: 1. Continue atorvastatin 20 mg once daily - Recheck labs today: CMP, Lipid evaluate med effect and possible adverse effects. 2. Follow up every 6 months.

## 2017-09-13 NOTE — Assessment & Plan Note (Signed)
Controlled hypertension.  BP at goal of less than 130/80 today.  Pt is working on lifestyle modifications.  Taking medications tolerating well without side effects.   Plan: 1. Continue losartan 50 mg once daily and hydrochlorothiazide 25 mg once daily. 2. Kidney function continues to be good w/ medication at last check. Due for recheck again. 3. Encouraged heart healthy diet and increasing exercise. 4. Check BP 1-2 x per week at home, keep log, and bring to clinic at next appointment. 5. Follow up 6 months or sooner if needed.

## 2017-09-13 NOTE — Assessment & Plan Note (Signed)
Stable and at goal.  See treatment above for Aortic ASCVD

## 2017-09-13 NOTE — Assessment & Plan Note (Signed)
Stable.  However, continues to have intermittent breakthrough symptoms.  Breakthrough symptoms are lessening in frequency and severity.  BID dosing did not help in past.  Pt has returned to daily dosing of omeprazole at 40 mg once daily.  Suspect is more likely related to her esophageal stricture.  Dietary triggers are also a component.  Plan: 1. Resume omeprazole 40 mg daily.  Consider GI referral if any additional worsening of symptoms for further eval of esophageal stricture. 2. Followup 6 months.

## 2017-09-26 ENCOUNTER — Other Ambulatory Visit: Payer: Self-pay | Admitting: Family Medicine

## 2017-09-26 DIAGNOSIS — I10 Essential (primary) hypertension: Secondary | ICD-10-CM

## 2017-11-07 DIAGNOSIS — M545 Low back pain: Secondary | ICD-10-CM | POA: Diagnosis not present

## 2017-11-07 DIAGNOSIS — M79651 Pain in right thigh: Secondary | ICD-10-CM | POA: Diagnosis not present

## 2017-11-07 DIAGNOSIS — R102 Pelvic and perineal pain: Secondary | ICD-10-CM | POA: Diagnosis not present

## 2017-12-15 ENCOUNTER — Telehealth: Payer: Self-pay | Admitting: Nurse Practitioner

## 2017-12-15 DIAGNOSIS — I10 Essential (primary) hypertension: Secondary | ICD-10-CM

## 2017-12-15 MED ORDER — LOSARTAN POTASSIUM 50 MG PO TABS: 50.0000 mg | ORAL_TABLET | Freq: Every day | ORAL | 2 refills | Status: DC

## 2017-12-15 NOTE — Telephone Encounter (Signed)
Pt needs a refill on losartan sent to [facility].  Her call back number is [phone]

## 2017-12-27 ENCOUNTER — Other Ambulatory Visit: Payer: Self-pay

## 2017-12-27 ENCOUNTER — Telehealth: Payer: Self-pay | Admitting: Nurse Practitioner

## 2017-12-27 DIAGNOSIS — E7800 Pure hypercholesterolemia, unspecified: Secondary | ICD-10-CM

## 2017-12-27 MED ORDER — ATORVASTATIN CALCIUM 20 MG PO TABS: 20.0000 mg | ORAL_TABLET | Freq: Every day | ORAL | 0 refills | Status: DC

## 2017-12-27 NOTE — Telephone Encounter (Signed)
Called to schedule Medicare Annual Wellness Visit with the Nurse Health Advisor.   If patient returns call, please note: their last AWV was on 9/4//18 please schedule AWV with NHA any date after 9/4 2019  Thank you! For any questions please contact: Jill Alexanders [phone] or Skype at: [email or web address]

## 2018-02-28 ENCOUNTER — Ambulatory Visit: Payer: Medicare HMO | Admitting: Urology

## 2018-02-28 ENCOUNTER — Encounter: Payer: Self-pay | Admitting: Urology

## 2018-03-06 ENCOUNTER — Ambulatory Visit (INDEPENDENT_AMBULATORY_CARE_PROVIDER_SITE_OTHER): Payer: Medicare HMO

## 2018-03-06 VITALS — BP 118/66 | HR 64 | Temp 98.6°F | Resp 16 | Ht <= 58 in | Wt 186.6 lb

## 2018-03-06 DIAGNOSIS — Z0000 Encounter for general adult medical examination without abnormal findings: Secondary | ICD-10-CM

## 2018-03-06 DIAGNOSIS — Z23 Encounter for immunization: Secondary | ICD-10-CM

## 2018-03-06 NOTE — Progress Notes (Signed)
Subjective:   Tracey Carroll is a 77 y.o. female who presents for Medicare Annual (Subsequent) preventive examination.  Review of Systems:   Cardiac Risk Factors include: advanced age (>46men, >75 women);obesity (BMI >30kg/m2)     Objective:     Vitals: BP 118/66 (BP Location: Right Arm, Patient Position: Sitting, Cuff Size: Normal)   Pulse 64   Temp 98.6 F (37 C) (Oral)   Resp 16   Ht 4\' 9"  (1.448 m)   Wt 186 lb 9.6 oz (84.6 kg)   BMI 40.38 kg/m   Body mass index is 40.38 kg/m.  Advanced Directives 03/06/2018 01/17/2017 04/08/2015 11/20/2014  Does Patient Have a Medical Advance Directive? No No No No  Does patient want to make changes to medical advance directive? - Yes (MAU/Ambulatory/Procedural Areas - Information given) - -  Would patient like information on creating a medical advance directive? Yes (MAU/Ambulatory/Procedural Areas - Information given) - No - patient declined information Yes - Educational materials given    Tobacco Social History   Tobacco Use  Smoking Status Never Smoker  Smokeless Tobacco Never Used     Counseling given: Not Answered   Clinical Intake:  Pre-visit preparation completed: Yes  Pain : No/denies pain     Nutritional Status: BMI > 30  Obese Nutritional Risks: None Diabetes: No  How often do you need to have someone help you when you read instructions, pamphlets, or other written materials from your doctor or pharmacy?: 1 - Never What is the last grade level you completed in school?: 12th grade  Interpreter Needed?: No  Information entered by :: Daizee Firmin,LPN   Past Medical History:  Diagnosis Date  . Acid reflux 10/19/2014  . Arthritis    arms  . Back pain, chronic 10/19/2014  . Dysphagia 10/20/2014  . Elevated BP 10/20/2014  . Fibroids, intramural 10/19/2014  . FOM (frequency of micturition) 10/19/2014  . GERD (gastroesophageal reflux disease)   . Hyperlipidemia   . 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 LBP (low back pain)  10/19/2014   Past Surgical History:  Procedure Laterality Date  . ESOPHAGOGASTRODUODENOSCOPY (EGD) WITH PROPOFOL N/A 11/20/2014   Procedure: ESOPHAGOGASTRODUODENOSCOPY (EGD) WITH PROPOFOL with dilation;  Surgeon: Lucilla Lame, MD;  Location: [facility];  Service: Endoscopy;  Laterality: N/A;  . TUBAL LIGATION     Family History  Problem Relation Age of Onset  . Diabetes Father        in her 36 and 34s  . Hypertension Daughter        her 40s  . Kidney cancer Neg Hx   . Bladder Cancer Neg Hx    Social History   Socioeconomic History  . Marital status: Single    Spouse name: Not on file  . Number of children: Not on file  . Years of education: Not on file  . Highest education level: 12th grade  Occupational History  . Occupation: retired  Scientific laboratory technician  . Financial resource strain: Not hard at all  . Food insecurity:    Worry: Never true    Inability: Never true  . Transportation needs:    Medical: No    Non-medical: No  Tobacco Use  . Smoking status: Never Smoker  . Smokeless tobacco: Never Used  Substance and Sexual Activity  . Alcohol use: No    Alcohol/week: 0.0 standard drinks  . Drug use: No  . Sexual activity: Not on file  Lifestyle  . Physical activity:    Days per  week: 0 days    Minutes per session: 0 min  . Stress: Not at all  Relationships  . Social connections:    Talks on phone: More than three times a week    Gets together: More than three times a week    Attends religious service: More than 4 times per year    Active member of club or organization: No    Attends meetings of clubs or organizations: Never    Relationship status: Widowed  Other Topics Concern  . Not on file  Social History Narrative   ** Merged History Encounter **        Outpatient Encounter Medications as of 03/06/2018  Medication Sig  . atorvastatin (LIPITOR) 20 MG tablet Take 1 tablet (20 mg total) by mouth daily.  Marland Kitchen losartan (COZAAR) 50 MG tablet Take 1 tablet (50 mg  total) by mouth daily.  Marland Kitchen omeprazole (PRILOSEC) 40 MG capsule Take 1 capsule (40 mg total) by mouth every morning.  . hydrochlorothiazide (HYDRODIURIL) 25 MG tablet Take 1 tablet (25 mg total) by mouth daily. (Patient not taking: Reported on 03/06/2018)   No facility-administered encounter medications on file as of 03/06/2018.     Activities of Daily Living In your present state of health, do you have any difficulty performing the following activities: 03/06/2018 06/13/2017  Hearing? N N  Comment declines hearing aids  -  Vision? N Y  Difficulty concentrating or making decisions? N N  Walking or climbing stairs? N N  Dressing or bathing? N N  Doing errands, shopping? N N  Preparing Food and eating ? N -  Using the Toilet? N -  In the past six months, have you accidently leaked urine? N -  Do you have problems with loss of bowel control? N -  Managing your Medications? N -  Managing your Finances? N -  Housekeeping or managing your Housekeeping? N -  Some recent data might be hidden    Patient Care Team: Mikey College, NP as PCP - General (Nurse Practitioner)    Assessment:   This is a routine wellness examination for Tracey Carroll.  Exercise Activities and Dietary recommendations Current Exercise Habits: The patient does not participate in regular exercise at present, Exercise limited by: None identified  Goals    . Increase water intake     Recommend drinking at least 5-6 glasses of water a day       Fall Risk Fall Risk  01/17/2017 01/19/2016 05/06/2015 03/25/2015 01/22/2015  Falls in the past year? No No No No No  Risk for fall due to : - - - - -   FALL RISK PREVENTION PERTAINING TO THE HOME:  Any stairs in or around the home WITH handrails? No  Home free of loose throw rugs in walkways, pet beds, electrical cords, etc? Yes  Adequate lighting in your home to reduce risk of falls? No   ASSISTIVE DEVICES UTILIZED TO PREVENT FALLS:  Life alert? No  Use of a cane,  walker or w/c? No  Grab bars in the bathroom? No  Shower chair or bench in shower? No  Elevated toilet seat or a handicapped toilet? No   DME ORDERS:  DME order needed?  No   TIMED UP AND GO:  Was the test performed? Yes .  Length of time to ambulate 10 feet: 8 sec.   GAIT:  Appearance of gait: Gait stead-fast without the use of an assistive device. Education: Fall risk prevention has been  discussed.  Intervention(s) required? No    Depression Screen PHQ 2/9 Scores 01/17/2017 01/19/2016 05/06/2015 03/25/2015  PHQ - 2 Score 0 0 0 0     Cognitive Function     6CIT Screen 03/06/2018 01/17/2017  What Year? 0 points 0 points  What month? 0 points 0 points  What time? 0 points 0 points  Count back from 20 0 points 0 points  Months in reverse 0 points 0 points  Repeat phrase 0 points 2 points  Total Score 0 2    Immunization History  Administered Date(s) Administered  . Influenza, High Dose Seasonal PF 01/22/2015, 03/21/2016, 01/17/2017, 03/06/2018  . Influenza-Unspecified 04/14/2014, 04/14/2014  . Pneumococcal Conjugate-13 12/18/2013  . Pneumococcal Polysaccharide-23 04/16/2012, 12/19/2014  . Tdap 08/08/2013, 08/08/2013    Qualifies for Shingles Vaccine?Yes  Due for Shingrix. Education has been provided regarding the importance of this vaccine. Pt has been advised to call insurance company to determine out of pocket expense. Advised may also receive vaccine at local pharmacy or Health Dept. Verbalized acceptance and understanding.  Tdap: up to date  Flu Vaccine: Due for Flu vaccine. Does the patient want to receive this vaccine today?  Yes .   Pneumococcal Vaccine: completed series   Screening Tests Health Maintenance  Topic Date Due  . INFLUENZA VACCINE  12/14/2017  . MAMMOGRAM  12/29/2018  . TETANUS/TDAP  08/09/2023  . COLONOSCOPY  12/18/2024  . DEXA SCAN  Completed  . PNA vac Low Risk Adult  Completed   Cancer Screenings:  Colorectal Screening: Completed  12/19/2014. Repeat every 10 years  Mammogram: Completed 12/28/2016. Repeat every 2 years   Bone Density: Completed 01/22/2015.  Lung Cancer Screening: (Low Dose CT Chest recommended if Age 4-80 years, 30 pack-year currently smoking OR have quit w/in 15years.) does not qualify.     Additional Screening:  Hepatitis C Screening: does not qualify  Vision Screening: Recommended annual ophthalmology exams for early detection of glaucoma and other disorders of the eye. Is the patient up to date with their annual eye exam?  yes Who is the provider or what is the name of the office in which the pt attends annual eye exams? Dr.Bell    Dental Screening: Recommended annual dental exams for proper oral hygiene  Community Resource Referral:  CRR required this visit?  No      Plan:    I have personally reviewed and addressed the Medicare Annual Wellness questionnaire and have noted the following in the patient's chart:  A. Medical and social history B. Use of alcohol, tobacco or illicit drugs  C. Current medications and supplements D. Functional ability and status E.  Nutritional status F.  Physical activity G. Advance directives H. List of other physicians I.  Hospitalizations, surgeries, and ER visits in previous 12 months J.  [place] such as hearing and vision if needed, cognitive and depression L. Referrals and appointments   In addition, I have reviewed and discussed with patient certain preventive protocols, quality metrics, and best practice recommendations. A written personalized care plan for preventive services as well as general preventive health recommendations were provided to patient.   Signed,  Tyler Aas, LPN Nurse Health Advisor   Nurse Notes:none

## 2018-03-06 NOTE — Patient Instructions (Addendum)
[name] , Thank you for taking time to come for your Medicare Wellness Visit. I appreciate your ongoing commitment to your health goals. Please review the following plan we discussed and let me know if I can assist you in the future.   Screening recommendations/referrals: Colonoscopy: completed 12/19/2014 Mammogram:  Completed 12/30/2014 Bone Density: completed 01/22/2015 Recommended yearly ophthalmology/optometry visit for glaucoma screening and checkup Recommended yearly dental visit for hygiene and checkup  Vaccinations: Influenza vaccine: done today  Pneumococcal vaccine: completed series Tdap vaccine: up to date Shingles vaccine: shingrix eligible, check with your insurance company for coverage     Advanced directives: Advance directive discussed with you today. I have provided a copy for you to complete at home and have notarized. Once this is complete please bring a copy in to our office so we can scan it into your chart.  Conditions/risks identified: Recommend drinking at least 5-6 glasses of water a day  Next appointment: Follow up on 03/12/2018 at 8:00am with Tracey Carroll. Follow up in one year for your annual wellness exam.    Preventive Care 65 Years and Older, Female Preventive care refers to lifestyle choices and visits with your health care provider that can promote health and wellness. What does preventive care include?  A yearly physical exam. This is also called an annual well check.  Dental exams once or twice a year.  Routine eye exams. Ask your health care provider how often you should have your eyes checked.  Personal lifestyle choices, including:  Daily care of your teeth and gums.  Regular physical activity.  Eating a healthy diet.  Avoiding tobacco and drug use.  Limiting alcohol use.  Practicing safe sex.  Taking low-dose aspirin every day.  Taking vitamin and mineral supplements as recommended by your health care provider. What happens  during an annual well check? The services and screenings done by your health care provider during your annual well check will depend on your age, overall health, lifestyle risk factors, and family history of disease. Counseling  Your health care provider may ask you questions about your:  Alcohol use.  Tobacco use.  Drug use.  Emotional well-being.  Home and relationship well-being.  Sexual activity.  Eating habits.  History of falls.  Memory and ability to understand (cognition).  Work and work Statistician.  Reproductive health. Screening  You may have the following tests or measurements:  Height, weight, and BMI.  Blood pressure.  Lipid and cholesterol levels. These may be checked every 5 years, or more frequently if you are over 43 years old.  Skin check.  Lung cancer screening. You may have this screening every year starting at age 36 if you have a 30-pack-year history of smoking and currently smoke or have quit within the past 15 years.  Fecal occult blood test (FOBT) of the stool. You may have this test every year starting at age 20.  Flexible sigmoidoscopy or colonoscopy. You may have a sigmoidoscopy every 5 years or a colonoscopy every 10 years starting at age 19.  Hepatitis C blood test.  Hepatitis B blood test.  Sexually transmitted disease (STD) testing.  Diabetes screening. This is done by checking your blood sugar (glucose) after you have not eaten for a while (fasting). You may have this done every 1-3 years.  Bone density scan. This is done to screen for osteoporosis. You may have this done starting at age 25.  Mammogram. This may be done every 1-2 years. Talk to your health  care provider about how often you should have regular mammograms. Talk with your health care provider about your test results, treatment options, and if necessary, the need for more tests. Vaccines  Your health care provider may recommend certain vaccines, such  as:  Influenza vaccine. This is recommended every year.  Tetanus, diphtheria, and acellular pertussis (Tdap, Td) vaccine. You may need a Td booster every 10 years.  Zoster vaccine. You may need this after age 57.  Pneumococcal 13-valent conjugate (PCV13) vaccine. One dose is recommended after age 44.  Pneumococcal polysaccharide (PPSV23) vaccine. One dose is recommended after age 14. Talk to your health care provider about which screenings and vaccines you need and how often you need them. This information is not intended to replace advice given to you by your health care provider. Make sure you discuss any questions you have with your health care provider. Document Released: 05/29/2015 Document Revised: 01/20/2016 Document Reviewed: 03/03/2015 Elsevier Interactive Patient Education  2017 [place] Prevention in the Home Falls can cause injuries. They can happen to people of all ages. There are many things you can do to make your home safe and to help prevent falls. What can I do on the outside of my home?  Regularly fix the edges of walkways and driveways and fix any cracks.  Remove anything that might make you trip as you walk through a door, such as a raised step or threshold.  Trim any bushes or trees on the path to your home.  Use bright outdoor lighting.  Clear any walking paths of anything that might make someone trip, such as rocks or tools.  Regularly check to see if handrails are loose or broken. Make sure that both sides of any steps have handrails.  Any raised decks and porches should have guardrails on the edges.  Have any leaves, snow, or ice cleared regularly.  Use sand or salt on walking paths during winter.  Clean up any spills in your garage right away. This includes oil or grease spills. What can I do in the bathroom?  Use night lights.  Install grab bars by the toilet and in the tub and shower. Do not use towel bars as grab bars.  Use  non-skid mats or decals in the tub or shower.  If you need to sit down in the shower, use a plastic, non-slip stool.  Keep the floor dry. Clean up any water that spills on the floor as soon as it happens.  Remove soap buildup in the tub or shower regularly.  Attach bath mats securely with double-sided non-slip rug tape.  Do not have throw rugs and other things on the floor that can make you trip. What can I do in the bedroom?  Use night lights.  Make sure that you have a light by your bed that is easy to reach.  Do not use any sheets or blankets that are too big for your bed. They should not hang down onto the floor.  Have a firm chair that has side arms. You can use this for support while you get dressed.  Do not have throw rugs and other things on the floor that can make you trip. What can I do in the kitchen?  Clean up any spills right away.  Avoid walking on wet floors.  Keep items that you use a lot in easy-to-reach places.  If you need to reach something above you, use a strong step stool that has a grab  bar.  Keep electrical cords out of the way.  Do not use floor polish or wax that makes floors slippery. If you must use wax, use non-skid floor wax.  Do not have throw rugs and other things on the floor that can make you trip. What can I do with my stairs?  Do not leave any items on the stairs.  Make sure that there are handrails on both sides of the stairs and use them. Fix handrails that are broken or loose. Make sure that handrails are as long as the stairways.  Check any carpeting to make sure that it is firmly attached to the stairs. Fix any carpet that is loose or worn.  Avoid having throw rugs at the top or bottom of the stairs. If you do have throw rugs, attach them to the floor with carpet tape.  Make sure that you have a light switch at the top of the stairs and the bottom of the stairs. If you do not have them, ask someone to add them for you. What  else can I do to help prevent falls?  Wear shoes that:  Do not have high heels.  Have rubber bottoms.  Are comfortable and fit you well.  Are closed at the toe. Do not wear sandals.  If you use a stepladder:  Make sure that it is fully opened. Do not climb a closed stepladder.  Make sure that both sides of the stepladder are locked into place.  Ask someone to hold it for you, if possible.  Clearly mark and make sure that you can see:  Any grab bars or handrails.  First and last steps.  Where the edge of each step is.  Use tools that help you move around (mobility aids) if they are needed. These include:  Canes.  Walkers.  Scooters.  Crutches.  Turn on the lights when you go into a dark area. Replace any light bulbs as soon as they burn out.  Set up your furniture so you have a clear path. Avoid moving your furniture around.  If any of your floors are uneven, fix them.  If there are any pets around you, be aware of where they are.  Review your medicines with your doctor. Some medicines can make you feel dizzy. This can increase your chance of falling. Ask your doctor what other things that you can do to help prevent falls. This information is not intended to replace advice given to you by your health care provider. Make sure you discuss any questions you have with your health care provider. Document Released: 02/26/2009 Document Revised: 10/08/2015 Document Reviewed: 06/06/2014 Elsevier Interactive Patient Education  2017 [employer].

## 2018-03-12 ENCOUNTER — Other Ambulatory Visit: Payer: Self-pay

## 2018-03-12 ENCOUNTER — Encounter: Payer: Self-pay | Admitting: Nurse Practitioner

## 2018-03-12 ENCOUNTER — Ambulatory Visit (INDEPENDENT_AMBULATORY_CARE_PROVIDER_SITE_OTHER): Payer: Medicare HMO | Admitting: Nurse Practitioner

## 2018-03-12 VITALS — BP 119/68 | HR 62 | Temp 97.8°F | Ht <= 58 in | Wt 185.6 lb

## 2018-03-12 DIAGNOSIS — K219 Gastro-esophageal reflux disease without esophagitis: Secondary | ICD-10-CM | POA: Diagnosis not present

## 2018-03-12 DIAGNOSIS — E7800 Pure hypercholesterolemia, unspecified: Secondary | ICD-10-CM | POA: Diagnosis not present

## 2018-03-12 DIAGNOSIS — I10 Essential (primary) hypertension: Secondary | ICD-10-CM | POA: Diagnosis not present

## 2018-03-12 MED ORDER — LOSARTAN POTASSIUM 50 MG PO TABS: 50.0000 mg | ORAL_TABLET | Freq: Every day | ORAL | 1 refills | Status: DC

## 2018-03-12 MED ORDER — ATORVASTATIN CALCIUM 20 MG PO TABS: 20.0000 mg | ORAL_TABLET | Freq: Every day | ORAL | 1 refills | Status: DC

## 2018-03-12 MED ORDER — HYDROCHLOROTHIAZIDE 12.5 MG PO TABS: 12.5000 mg | ORAL_TABLET | Freq: Every day | ORAL | 1 refills | Status: DC

## 2018-03-12 MED ORDER — OMEPRAZOLE 40 MG PO CPDR: 40.0000 mg | DELAYED_RELEASE_CAPSULE | Freq: Every morning | ORAL | 1 refills | Status: DC

## 2018-03-12 NOTE — Assessment & Plan Note (Addendum)
Stable and at goal.  Currently asymptomatic w/ atherosclerosis noted on imaging 12/2016.  Pt started is continuing to tolerate atorvastatin well without side effects.  Lipids continue to be at goal with therapy at last lab check.  Plan: 1. Continue atorvastatin 20 mg once daily 2. Repeat labs today. 3. Follow up 6 months.

## 2018-03-12 NOTE — Assessment & Plan Note (Addendum)
Stable.  However, continues to have intermittent breakthrough symptoms with improvement in frequency and severity.  BID dosing did not help in past.  Pt continues daily dosing of omeprazole at 40 mg once daily.  Dietary triggers are also a component.   Plan: 1. Continue omeprazole 40 mg daily.  Consider GI referral if any additional worsening of symptoms for further eval of esophageal stricture. 2. Labs today for CBC monitoring. 3. Followup 6 months.

## 2018-03-12 NOTE — Progress Notes (Signed)
Subjective:    Patient ID: Tracey Carroll, female    DOB: 02-27-1941, 77 y.o.   MRN: [id number]  Tracey Carroll is a 77 y.o. female presenting on 03/12/2018 for Hypertension   HPI Hypertension - She is checking BP at home or outside of clinic.  Readings 115-120/80-85 - Current medications: losartan 50 mg once daily, HCTZ 25 mg once daily, tolerating well without side effects - She is symptomatic with dizziness and lighteadedness occasionally (less than 2 times per week), worsening with hot weather. - Pt denies headache, changes in vision, chest tightness/pressure, palpitations, leg swelling, sudden loss of speech or loss of consciousness. - She reports no regular exercise routine.  Is getting housework done, walks to her apartment.  - Her diet is moderate in salt, moderate in fat, and moderate in carbohydrates.  GERD Patient is taking omeprazole 40 mg once daily.  Is occasionally having reflux.  Occasionally occurs with nausea.   No BM changes or blood noted in stool.   - does have hot flashes and nausea, but never vomits.  Denies all chest pain with these.  Aortic Atherosclerosis Patient is currently on atorvastatin 20 mg once daily.  Occasionally has hand cramps/"tightening" in hands and "pinching" in legs. - Rare palpitations. - Pt denies changes in vision, chest tightness/pressure, shortness of breath, leg pain while walking, leg or arm weakness, and sudden loss of speech or loss of consciousness.   Social History   Tobacco Use  . Smoking status: Never Smoker  . Smokeless tobacco: Never Used  Substance Use Topics  . Alcohol use: No    Alcohol/week: 0.0 standard drinks  . Drug use: No    Review of Systems Per HPI unless specifically indicated above     Objective:    BP 119/68   Pulse 62   Temp 97.8 F (36.6 C) (Oral)   Ht 4\' 9"  (1.448 m)   Wt 185 lb 9.6 oz (84.2 kg)   BMI 40.16 kg/m   Wt Readings from Last 3 Encounters:  03/12/18 185 lb 9.6 oz (84.2 kg)    03/06/18 186 lb 9.6 oz (84.6 kg)  09/11/17 193 lb 3.2 oz (87.6 kg)    Physical Exam  Constitutional: She is oriented to person, place, and time. She appears well-developed and well-nourished. No distress.  HENT:  Head: Normocephalic and atraumatic.  Right Ear: External ear normal.  Left Ear: External ear normal.  Nose: Nose normal.  Mouth/Throat: Oropharynx is clear and moist.  Eyes: Pupils are equal, round, and reactive to light. Conjunctivae are normal.  Neck: Normal range of motion. Neck supple. No JVD present. No tracheal deviation present. No thyromegaly present.  Cardiovascular: Normal rate, regular rhythm, normal heart sounds and intact distal pulses. Exam reveals no gallop and no friction rub.  No murmur heard. Pulmonary/Chest: Effort normal and breath sounds normal. No respiratory distress.  Abdominal: Soft. Bowel sounds are normal. She exhibits no distension. There is no hepatosplenomegaly. There is no tenderness.  Musculoskeletal: Normal range of motion. She exhibits edema (+1 non-pitting bilateral pedal edema).  Lymphadenopathy:    She has no cervical adenopathy.  Neurological: She is alert and oriented to person, place, and time. No cranial nerve deficit.  Skin: Skin is warm and dry. Capillary refill takes less than 2 seconds.  Psychiatric: She has a normal mood and affect. Her behavior is normal. Judgment and thought content normal.  Nursing note and vitals reviewed.  Results for orders placed or performed in visit on  09/11/17  COMPLETE METABOLIC PANEL WITH GFR  Result Value Ref Range   Glucose, Bld 91 65 - 99 mg/dL   BUN 10 7 - 25 mg/dL   Creat 0.81 0.60 - 0.93 mg/dL   GFR, Est Non African American 72 > OR = 60 mL/min/1.78m2   GFR, Est African American 83 > OR = 60 mL/min/1.2m2   BUN/Creatinine Ratio NOT APPLICABLE 6 - 22 (calc)   Sodium 140 135 - 146 mmol/L   Potassium 3.8 3.5 - 5.3 mmol/L   Chloride 102 98 - 110 mmol/L   CO2 31 20 - 32 mmol/L   Calcium 9.6  8.6 - 10.4 mg/dL   Total Protein 7.2 6.1 - 8.1 g/dL   Albumin 4.2 3.6 - 5.1 g/dL   Globulin 3.0 1.9 - 3.7 g/dL (calc)   AG Ratio 1.4 1.0 - 2.5 (calc)   Total Bilirubin 0.8 0.2 - 1.2 mg/dL   Alkaline phosphatase (APISO) 56 33 - 130 U/L   AST 19 10 - 35 U/L   ALT 8 6 - 29 U/L  Lipid panel  Result Value Ref Range   Cholesterol 169 <200 mg/dL   HDL 65 >50 mg/dL   Triglycerides 81 <150 mg/dL   LDL Cholesterol (Calc) 87 mg/dL (calc)   Total CHOL/HDL Ratio 2.6 <5.0 (calc)   Non-HDL Cholesterol (Calc) 104 <130 mg/dL (calc)  Hemoglobin A1c  Result Value Ref Range   Hgb A1c MFr Bld 5.2 <5.7 % of total Hgb   Mean Plasma Glucose 103 (calc)   eAG (mmol/L) 5.7 (calc)      Assessment & Plan:   Problem List Items Addressed This Visit      Cardiovascular and Mediastinum   HBP (high blood pressure) - Primary    Controlled hypertension.  BP at goal of less than 130/80 today.  Pt is working on lifestyle modifications.  Taking medications tolerating well except for intermittent side effects of dizziness.   Plan: 1. Continue losartan 50 mg once daily  - REDUCE hydrochlorothiazide to 12.5mg  once daily. 2. Kidney function continues to be good w/ medication at last check. Due for recheck again. 3. Encouraged heart healthy diet and increasing exercise. 4. Check BP 1-2 x per week at home, keep log, and bring to clinic at next appointment. 5. Follow up 6 months or sooner if needed.      Relevant Medications   atorvastatin (LIPITOR) 20 MG tablet   hydrochlorothiazide (HYDRODIURIL) 12.5 MG tablet   losartan (COZAAR) 50 MG tablet   Other Relevant Orders   COMPLETE METABOLIC PANEL WITH GFR     Digestive   Acid reflux    Stable.  However, continues to have intermittent breakthrough symptoms with improvement in frequency and severity.  BID dosing did not help in past.  Pt continues daily dosing of omeprazole at 40 mg once daily.  Dietary triggers are also a component.   Plan: 1. Continue  omeprazole 40 mg daily.  Consider GI referral if any additional worsening of symptoms for further eval of esophageal stricture. 2. Labs today for CBC monitoring. 3. Followup 6 months.      Relevant Medications   omeprazole (PRILOSEC) 40 MG capsule   Other Relevant Orders   CBC with Differential/Platelet     Other   Pure hypercholesterolemia    Stable and at goal.  Currently asymptomatic w/ atherosclerosis noted on imaging 12/2016.  Pt started is continuing to tolerate atorvastatin well without side effects.  Lipids continue to be at goal with  therapy at last lab check.  Plan: 1. Continue atorvastatin 20 mg once daily 2. Repeat labs today. 3. Follow up 6 months.        Relevant Medications   atorvastatin (LIPITOR) 20 MG tablet   hydrochlorothiazide (HYDRODIURIL) 12.5 MG tablet   losartan (COZAAR) 50 MG tablet   Other Relevant Orders   COMPLETE METABOLIC PANEL WITH GFR   Lipid panel      Meds ordered this encounter  Medications  . DISCONTD: hydrochlorothiazide (HYDRODIURIL) 12.5 MG tablet    Sig: Take 1 tablet (12.5 mg total) by mouth daily.    Dispense:  90 tablet    Refill:  1    Order Specific Question:   Supervising Provider    Answer:   Olin Hauser [[id number]]  . DISCONTD: omeprazole (PRILOSEC) 40 MG capsule    Sig: Take 1 capsule (40 mg total) by mouth every morning.    Dispense:  90 capsule    Refill:  1    Order Specific Question:   Supervising Provider    Answer:   Olin Hauser [[id number]]  . DISCONTD: atorvastatin (LIPITOR) 20 MG tablet    Sig: Take 1 tablet (20 mg total) by mouth daily.    Dispense:  90 tablet    Refill:  1    Order Specific Question:   Supervising Provider    Answer:   Olin Hauser [[id number]]  . DISCONTD: losartan (COZAAR) 50 MG tablet    Sig: Take 1 tablet (50 mg total) by mouth daily.    Dispense:  90 tablet    Refill:  1    Order Specific Question:   Supervising Provider    Answer:   Olin Hauser  [[id number]]  . DISCONTD: atorvastatin (LIPITOR) 20 MG tablet    Sig: Take 1 tablet (20 mg total) by mouth daily.    Dispense:  90 tablet    Refill:  1    Order Specific Question:   Supervising Provider    Answer:   Olin Hauser [[id number]]  . DISCONTD: hydrochlorothiazide (HYDRODIURIL) 12.5 MG tablet    Sig: Take 1 tablet (12.5 mg total) by mouth daily.    Dispense:  90 tablet    Refill:  1    Order Specific Question:   Supervising Provider    Answer:   Olin Hauser [[id number]]  . DISCONTD: losartan (COZAAR) 50 MG tablet    Sig: Take 1 tablet (50 mg total) by mouth daily.    Dispense:  90 tablet    Refill:  1    Order Specific Question:   Supervising Provider    Answer:   Olin Hauser [[id number]]  . DISCONTD: omeprazole (PRILOSEC) 40 MG capsule    Sig: Take 1 capsule (40 mg total) by mouth every morning.    Dispense:  90 capsule    Refill:  1    Order Specific Question:   Supervising Provider    Answer:   Olin Hauser [[id number]]  . atorvastatin (LIPITOR) 20 MG tablet    Sig: Take 1 tablet (20 mg total) by mouth daily.    Dispense:  90 tablet    Refill:  1    Order Specific Question:   Supervising Provider    Answer:   Olin Hauser [[id number]]  . hydrochlorothiazide (HYDRODIURIL) 12.5 MG tablet    Sig: Take 1 tablet (12.5 mg total) by mouth daily.    Dispense:  90 tablet    Refill:  1    Order Specific Question:   Supervising Provider    Answer:   Olin Hauser [[id number]]  . losartan (COZAAR) 50 MG tablet    Sig: Take 1 tablet (50 mg total) by mouth daily.    Dispense:  90 tablet    Refill:  1    Order Specific Question:   Supervising Provider    Answer:   Olin Hauser [[id number]]  . omeprazole (PRILOSEC) 40 MG capsule    Sig: Take 1 capsule (40 mg total) by mouth every morning.    Dispense:  90 capsule    Refill:  1    Order Specific Question:   Supervising Provider    Answer:   Olin Hauser [[id number]]   Follow up  plan: Return in about 6 months (around 09/11/2018) for hypertension, GERD.  Cassell Smiles, DNP, AGPCNP-BC Adult Gerontology Primary Care Nurse Practitioner [facility] 03/12/2018, 8:12 AM

## 2018-03-12 NOTE — Patient Instructions (Addendum)
Tracey Carroll,   Thank you for coming in to clinic today.  1. For your reflux: - May add Pepcid 20 mg one tablet by mouth twice daily for 14 days to help control your reflux. This is over the counter. - Continue omeprazole during this and continue that only.   2. For blood pressure: - REDUCE your hydrochlorothiazide to 12.5 mg once daily.  This may help reduce your dizziness.   Please schedule a follow-up appointment with Cassell Smiles, AGNP. Return in about 6 months (around 09/11/2018) for hypertension, GERD.  If you have any other questions or concerns, please feel free to call the clinic or send a message through [place]. You may also schedule an earlier appointment if necessary.  You will receive a survey after today's visit either digitally by e-mail or paper by [employer]. Your experiences and feedback matter to [place].  Please respond so we know how we are doing as we provide care for you.   Cassell Smiles, DNP, AGNP-BC Adult Gerontology Nurse Practitioner [place]

## 2018-03-12 NOTE — Assessment & Plan Note (Addendum)
Controlled hypertension.  BP at goal of less than 130/80 today.  Pt is working on lifestyle modifications.  Taking medications tolerating well except for intermittent side effects of dizziness.   Plan: 1. Continue losartan 50 mg once daily  - REDUCE hydrochlorothiazide to 12.5mg  once daily. 2. Kidney function continues to be good w/ medication at last check. Due for recheck again. 3. Encouraged heart healthy diet and increasing exercise. 4. Check BP 1-2 x per week at home, keep log, and bring to clinic at next appointment. 5. Follow up 6 months or sooner if needed.

## 2018-03-13 LAB — LIPID PANEL
Cholesterol: 173 mg/dL (ref <200)
HDL: 68 mg/dL (ref >50)
LDL Cholesterol (Calc): 87 mg/dL (calc)
Non-HDL Cholesterol (Calc): 105 mg/dL (calc) (ref <130)
Total CHOL/HDL Ratio: 2.5 (calc) (ref <5.0)
Triglycerides: 90 mg/dL (ref <150)

## 2018-03-13 LAB — CBC WITH DIFFERENTIAL/PLATELET
Basophils Absolute: 39 cells/uL (ref 0–200)
Basophils Relative: 0.9 %
Eosinophils Absolute: 39 cells/uL (ref 15–500)
Eosinophils Relative: 0.9 %
HCT: 35.1 % (ref 35.0–45.0)
Hemoglobin: 11.8 g/dL (ref 11.7–15.5)
Lymphs Abs: 1367 cells/uL (ref 850–3900)
MCH: 31.6 pg (ref 27.0–33.0)
MCHC: 33.6 g/dL (ref 32.0–36.0)
MCV: 93.9 fL (ref 80.0–100.0)
MPV: 10.5 fL (ref 7.5–12.5)
Monocytes Relative: 8.9 %
Neutro Abs: 2473 cells/uL (ref 1500–7800)
Neutrophils Relative %: 57.5 %
Platelets: 214 10*3/uL (ref 140–400)
RBC: 3.74 10*6/uL — ABNORMAL LOW (ref 3.80–5.10)
RDW: 12.2 % (ref 11.0–15.0)
Total Lymphocyte: 31.8 %
WBC mixed population: 383 cells/uL (ref 200–950)
WBC: 4.3 10*3/uL (ref 3.8–10.8)

## 2018-03-13 LAB — COMPLETE METABOLIC PANEL WITH GFR
AG Ratio: 1.4 (calc) (ref 1.0–2.5)
ALT: 9 U/L (ref 6–29)
AST: 18 U/L (ref 10–35)
Albumin: 4.1 g/dL (ref 3.6–5.1)
Alkaline phosphatase (APISO): 55 U/L (ref 33–130)
BUN: 9 mg/dL (ref 7–25)
CO2: 33 mmol/L — ABNORMAL HIGH (ref 20–32)
Calcium: 9.6 mg/dL (ref 8.6–10.4)
Chloride: 102 mmol/L (ref 98–110)
Creat: 0.69 mg/dL (ref 0.60–0.93)
GFR, Est African American: 99 mL/min/{1.73_m2} (ref >=60)
GFR, Est Non African American: 86 mL/min/{1.73_m2} (ref >=60)
Globulin: 2.9 g/dL (calc) (ref 1.9–3.7)
Glucose, Bld: 90 mg/dL (ref 65–99)
Potassium: 4 mmol/L (ref 3.5–5.3)
Sodium: 140 mmol/L (ref 135–146)
Total Bilirubin: 0.6 mg/dL (ref 0.2–1.2)
Total Protein: 7 g/dL (ref 6.1–8.1)

## 2018-03-19 ENCOUNTER — Telehealth: Payer: Self-pay | Admitting: Urology

## 2018-03-19 ENCOUNTER — Encounter: Payer: Self-pay | Admitting: Urology

## 2018-03-19 NOTE — Telephone Encounter (Signed)
Certified letter sent 03/20/2018.

## 2018-04-04 ENCOUNTER — Encounter: Payer: Self-pay | Admitting: Urology

## 2018-04-04 ENCOUNTER — Ambulatory Visit (INDEPENDENT_AMBULATORY_CARE_PROVIDER_SITE_OTHER): Payer: Medicare HMO | Admitting: Urology

## 2018-04-04 VITALS — BP 148/85 | HR 73 | Ht <= 58 in | Wt 185.3 lb

## 2018-04-04 DIAGNOSIS — R102 Pelvic and perineal pain: Secondary | ICD-10-CM | POA: Diagnosis not present

## 2018-04-04 DIAGNOSIS — N952 Postmenopausal atrophic vaginitis: Secondary | ICD-10-CM

## 2018-04-04 DIAGNOSIS — Z87448 Personal history of other diseases of urinary system: Secondary | ICD-10-CM | POA: Diagnosis not present

## 2018-04-04 LAB — URINALYSIS, COMPLETE
Bilirubin, UA: NEGATIVE
Glucose, UA: NEGATIVE
Ketones, UA: NEGATIVE
NITRITE UA: NEGATIVE
PH UA: 7 (ref 5.0–7.5)
Protein, UA: NEGATIVE
Specific Gravity, UA: 1.01 (ref 1.005–1.030)
Urobilinogen, Ur: 1 mg/dL (ref 0.2–1.0)

## 2018-04-04 LAB — MICROSCOPIC EXAMINATION: WBC, UA: NONE SEEN /hpf (ref 0–5)

## 2018-04-04 NOTE — Progress Notes (Signed)
04/04/2018 9:18 AM   Tracey Carroll 01/04/1941 [id number]  Referring provider: Mikey College, NP [place], [place] [address]  Chief Complaint  Patient presents with  . Follow-up    HPI: Patient is a 77 year old African American female with a history of hematuria and vaginal atrophy who presents today for yearly follow up.  History of hematuria Non-smoker.  She did undergo a hematuria work up in 2015 with CTU and cystoscopy.  No malignancies were found.  CTU on 01/13/2017 revealed no acute findings within the abdomen or pelvis.  No urinary tract calculi or other explanation for patient's hematuria. Aortic atherosclerosis.  Cysto on 02/28/2017 was NED.   She does not report any gross hematuria.  Her UA today is negative for hematuria.      Vaginal atrophy She is not using the vaginal estrogen cream.  She is not having vaginal itching or burning.  No reports of vaginal discharge.  She is not reporting any recent UTI's.  Today, she is having suprapubic pain that radiates down the inside of her legs.  This has been occurring for the last week.  She says it is a "hurting" pain.  When she stands up, it is a hard hurtin'.  She has a burning before she urinates.  Patient denies any gross hematuria, dysuria or suprapubic/flank pain.  Patient denies any fevers, chills, nausea or vomiting.  Her UA today is positive for moderate bacteria.    She states something similar several months ago and was evaluated and said nothing was wrong.      PMH: Past Medical History:  Diagnosis Date  . Acid reflux 10/19/2014  . Arthritis    arms  . Back pain, chronic 10/19/2014  . Dysphagia 10/20/2014  . Elevated BP 10/20/2014  . Fibroids, intramural 10/19/2014  . FOM (frequency of micturition) 10/19/2014  . GERD (gastroesophageal reflux disease)   . Hyperlipidemia   . 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 LBP (low back pain) 10/19/2014    Surgical History: Past Surgical History:  Procedure  Laterality Date  . ESOPHAGOGASTRODUODENOSCOPY (EGD) WITH PROPOFOL N/A 11/20/2014   Procedure: ESOPHAGOGASTRODUODENOSCOPY (EGD) WITH PROPOFOL with dilation;  Surgeon: Lucilla Lame, MD;  Location: [facility];  Service: Endoscopy;  Laterality: N/A;  . TUBAL LIGATION      Home Medications:  Allergies as of 04/04/2018   No Known Allergies     Medication List        Accurate as of 04/04/18  9:18 AM. Always use your most recent med list.          atorvastatin 20 MG tablet Commonly known as:  LIPITOR Take 1 tablet (20 mg total) by mouth daily.   hydrochlorothiazide 12.5 MG tablet Commonly known as:  HYDRODIURIL Take 1 tablet (12.5 mg total) by mouth daily.   losartan 50 MG tablet Commonly known as:  COZAAR Take 1 tablet (50 mg total) by mouth daily.   omeprazole 40 MG capsule Commonly known as:  PRILOSEC Take 1 capsule (40 mg total) by mouth every morning.       Allergies: No Known Allergies  Family History: Family History  Problem Relation Age of Onset  . Diabetes Father        in her 43 and 40s  . Hypertension Daughter        her 79s  . Kidney cancer Neg Hx   . Bladder Cancer Neg Hx     Social History:  reports that she  has never smoked. She has never used smokeless tobacco. She reports that she does not drink alcohol or use drugs.  ROS: UROLOGY Frequent Urination?: No Hard to postpone urination?: No Burning/pain with urination?: No Get up at night to urinate?: No Leakage of urine?: No Urine stream starts and stops?: No Trouble starting stream?: No Do you have to strain to urinate?: No Blood in urine?: Yes Urinary tract infection?: No Sexually transmitted disease?: No Injury to kidneys or bladder?: No Painful intercourse?: No Weak stream?: No Currently pregnant?: No Vaginal bleeding?: No Last menstrual period?: n  Gastrointestinal Nausea?: No Vomiting?: No Indigestion/heartburn?: No Diarrhea?: No Constipation?: No  Constitutional Fever:  No Night sweats?: Yes Weight loss?: No Fatigue?: No  Skin Skin rash/lesions?: No Itching?: No  Eyes Blurred vision?: No Double vision?: No  Ears/Nose/Throat Sore throat?: No Sinus problems?: No  Hematologic/Lymphatic Swollen glands?: No Easy bruising?: No  Cardiovascular Leg swelling?: Yes Chest pain?: No  Respiratory Cough?: No Shortness of breath?: No  Endocrine Excessive thirst?: No  Musculoskeletal Back pain?: No Joint pain?: Yes  Neurological Headaches?: No Dizziness?: No  Psychologic Depression?: No Anxiety?: No  Physical Exam: BP (!) 148/85 (BP Location: Left Arm, Patient Position: Sitting, Cuff Size: Normal)   Pulse 73   Ht 4\' 9"  (1.448 m)   Wt 185 lb 4.8 oz (84.1 kg)   BMI 40.10 kg/m   Constitutional:  Well nourished. Alert and oriented, No acute distress. HEENT: [place] AT, moist mucus membranes.  Trachea midline, no masses. Cardiovascular: No clubbing, cyanosis, or edema. Respiratory: Normal respiratory effort, no increased work of breathing. Skin: No rashes, bruises or suspicious lesions. Neurologic: Grossly intact, no focal deficits, moving all 4 extremities. Psychiatric: Normal mood and affect.   Laboratory Data: Lab Results  Component Value Date   CREATININE 0.69 03/12/2018    Lab Results  Component Value Date   AST 18 03/12/2018   Lab Results  Component Value Date   ALT 9 03/12/2018    Urinalysis Moderate bacteria.  See EPIC.   I have reviewed the labs.   Assessment & Plan:    1. Suprapubic pain UA with moderate bacteria - will send for culture to rule out indolent infection Will request records [facility]  If urine culture is negative, would suggest seeing PCP as it is worsened with standing ?MSK   2.History of hematuria Hematuria work up completed in 2015 and 02/2017 - NED No report of gross hematuria  UA today negative for hematuria RTC in one year for UA - patient to report any gross hematuria in the interim     3. Vaginal atrophy Not using cream No vaginal complaints at this time   Return in about 1 year (around 04/05/2019) for UA and symptom recheck .  These notes generated with voice recognition software. I apologize for typographical errors.  Zara Council, PA-C  [facility] [address] [place], [place] [address] [phone]

## 2018-04-08 LAB — CULTURE, URINE COMPREHENSIVE

## 2018-04-09 ENCOUNTER — Telehealth: Payer: Self-pay

## 2018-04-09 NOTE — Telephone Encounter (Signed)
Informed patient of urine results and recommended she follow up with PCP for pain per Larene Beach.

## 2018-04-09 NOTE — Telephone Encounter (Signed)
-----   Message from Nori Riis, PA-C sent at 04/09/2018  7:50 AM EST ----- Please let Tracey Carroll know that her urine culture is negative.  She needs to follow up with her PCP regarding her pain.

## 2018-08-13 ENCOUNTER — Other Ambulatory Visit: Payer: Self-pay | Admitting: Nurse Practitioner

## 2018-08-13 DIAGNOSIS — E7800 Pure hypercholesterolemia, unspecified: Secondary | ICD-10-CM

## 2018-08-13 DIAGNOSIS — I10 Essential (primary) hypertension: Secondary | ICD-10-CM

## 2018-09-03 ENCOUNTER — Other Ambulatory Visit: Payer: Self-pay | Admitting: Nurse Practitioner

## 2018-09-03 DIAGNOSIS — I10 Essential (primary) hypertension: Secondary | ICD-10-CM

## 2018-09-11 ENCOUNTER — Ambulatory Visit: Payer: Medicare HMO | Admitting: Nurse Practitioner

## 2018-09-12 ENCOUNTER — Encounter: Payer: Self-pay | Admitting: Family Medicine

## 2018-09-12 ENCOUNTER — Ambulatory Visit (INDEPENDENT_AMBULATORY_CARE_PROVIDER_SITE_OTHER): Payer: Medicare HMO | Admitting: Family Medicine

## 2018-09-12 ENCOUNTER — Other Ambulatory Visit: Payer: Self-pay

## 2018-09-12 DIAGNOSIS — I10 Essential (primary) hypertension: Secondary | ICD-10-CM

## 2018-09-12 DIAGNOSIS — K219 Gastro-esophageal reflux disease without esophagitis: Secondary | ICD-10-CM

## 2018-09-12 MED ORDER — OMEPRAZOLE 40 MG PO CPDR: 40.0000 mg | DELAYED_RELEASE_CAPSULE | Freq: Every morning | ORAL | 1 refills | Status: DC

## 2018-09-12 NOTE — Assessment & Plan Note (Addendum)
Well-controlled HTN - previously lowered HCTZ - Home BP readings normal  No known complications - still mild side effect of intermittent transient dizziness    Plan:  1. Continue current BP regimen - Losartan 50mg  daily, HCTZ 12.5mg  daily - no dose changes again today 2. Encourage improved lifestyle - low sodium diet, regular exercise 3. Continue monitor BP outside office, bring readings to next visit, if persistently >140/90 or new symptoms notify office sooner 4. Follow-up 6 months w/ PCP for annual  Advised her that we could consider reducing Thiazide diuretic once more and discontinue it or take half pill in future - if still experiencing dizziness

## 2018-09-12 NOTE — Assessment & Plan Note (Signed)
Stable Currently seems controlled, without reported flare ups Controlled on PPI Refill Omeprazole 40mg  daily

## 2018-09-12 NOTE — Progress Notes (Signed)
Virtual Visit via Telephone The purpose of this virtual visit is to provide medical care while limiting exposure to the novel coronavirus (COVID19) for both patient and office staff.  Consent was obtained for phone visit:  Yes.   Answered questions that patient had about telehealth interaction:  Yes.   I discussed the limitations, risks, security and privacy concerns of performing an evaluation and management service by telephone. I also discussed with the patient that there may be a patient responsible charge related to this service. The patient expressed understanding and agreed to proceed.  Patient Location: Home Provider Location: [facility] (Office)  PCP is Cassell Smiles, AGPCNP-BC - I am currently covering during her maternity leave.  ---------------------------------------------------------------------- Chief Complaint  Patient presents with  . Hypertension    S: Reviewed CMA documentation. I have called patient and gathered additional HPI as follows:  CHRONIC HTN: Reports she checks home BP readings 120/80s on average. Current Meds - Losartan 50mg  daily, HCTZ 12.5mg  daily   Reports good compliance, took meds today. Tolerating well, w/ one side effect of dizziness. Lifestyle: - Diet: balanced, tries to lower salt but not always - Exercise: limited, active around house Denies CP, dyspnea, HA, edema, dizziness / lightheadedness  GERD Taking Omeprazole 40mg  daily for stomach acid heartburn. Due for refill Denies abdominal pain, nausea vomiting  Patient is currently at home in isolation Denies any high risk travel to areas of current concern for COVID19. Denies any known or suspected exposure to person with or possibly with COVID19.  Denies any fevers, chills, sweats, body ache, cough, shortness of breath, sinus pain or pressure, headache, abdominal pain, diarrhea  Past Medical History:  Diagnosis Date  . Acid reflux 10/19/2014  . Arthritis    arms  .  Back pain, chronic 10/19/2014  . Dysphagia 10/20/2014  . Elevated BP 10/20/2014  . Fibroids, intramural 10/19/2014  . FOM (frequency of micturition) 10/19/2014  . GERD (gastroesophageal reflux disease)   . Hyperlipidemia   . 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 LBP (low back pain) 10/19/2014   Social History   Tobacco Use  . Smoking status: Never Smoker  . Smokeless tobacco: Never Used  Substance Use Topics  . Alcohol use: No    Alcohol/week: 0.0 standard drinks  . Drug use: No    Current Outpatient Medications:  .  atorvastatin (LIPITOR) 20 MG tablet, TAKE 1 TABLET (20 MG TOTAL) BY MOUTH DAILY., Disp: 90 tablet, Rfl: 1 .  hydrochlorothiazide (HYDRODIURIL) 12.5 MG tablet, TAKE 1 TABLET (12.5 MG TOTAL) BY MOUTH DAILY., Disp: 90 tablet, Rfl: 1 .  losartan (COZAAR) 50 MG tablet, TAKE 1 TABLET EVERY DAY, Disp: 90 tablet, Rfl: 1 .  omeprazole (PRILOSEC) 40 MG capsule, Take 1 capsule (40 mg total) by mouth every morning., Disp: 90 capsule, Rfl: 1  Depression screen [facility] 2/9 09/12/2018 03/12/2018 01/17/2017  Decreased Interest 0 0 0  Down, Depressed, Hopeless 0 0 0  PHQ - 2 Score 0 0 0    No flowsheet data found.  -------------------------------------------------------------------------- O: No physical exam performed due to remote telephone encounter.  Lab results reviewed from 02/2018    Chemistry      Component Value Date/Time   NA 140 03/12/2018 0903   K 4.0 03/12/2018 0903   CL 102 03/12/2018 0903   CO2 33 (H) 03/12/2018 0903   BUN 9 03/12/2018 0903   BUN 14 01/04/2017 1622   CREATININE 0.69 03/12/2018 0903      Component Value Date/Time  CALCIUM 9.6 03/12/2018 0903   ALKPHOS 63 12/09/2016 0900   AST 18 03/12/2018 0903   ALT 9 03/12/2018 0903   BILITOT 0.6 03/12/2018 0903     Lipid Panel     Component Value Date/Time   CHOL 173 03/12/2018 0903   TRIG 90 03/12/2018 0903   HDL 68 03/12/2018 0903   CHOLHDL 2.5 03/12/2018 0903   LDLCALC 87 03/12/2018 0903    No results  found for this or any previous visit (from the past 2160 hour(s)).  -------------------------------------------------------------------------- A&P:  Problem List Items Addressed This Visit    Acid reflux    Stable Currently seems controlled, without reported flare ups Controlled on PPI Refill Omeprazole 40mg  daily      Relevant Medications   omeprazole (PRILOSEC) 40 MG capsule   Essential hypertension - Primary    Well-controlled HTN - previously lowered HCTZ - Home BP readings normal  No known complications - still mild side effect of intermittent transient dizziness    Plan:  1. Continue current BP regimen - Losartan 50mg  daily, HCTZ 12.5mg  daily - no dose changes again today 2. Encourage improved lifestyle - low sodium diet, regular exercise 3. Continue monitor BP outside office, bring readings to next visit, if persistently >140/90 or new symptoms notify office sooner 4. Follow-up 6 months w/ PCP for annual  Advised her that we could consider reducing Thiazide diuretic once more and discontinue it or take half pill in future - if still experiencing dizziness          Meds ordered this encounter  Medications  . omeprazole (PRILOSEC) 40 MG capsule    Sig: Take 1 capsule (40 mg total) by mouth every morning.    Dispense:  90 capsule    Refill:  1    Follow-up: - Return in 6 months for Annual Physical w/ PCP - Future labs to be ordered at time of visit or after as per PCP  Patient verbalizes understanding with the above medical recommendations including the limitation of remote medical advice.  Specific follow-up and call-back criteria were given for patient to follow-up or seek medical care more urgently if needed.   - Time spent in direct consultation with patient on phone: 7 minutes   Nobie Putnam, [facility] 09/12/2018, 8:08 AM

## 2018-09-12 NOTE — Patient Instructions (Addendum)
AVS given over phone. No MyChart access  DUE for FASTING BLOOD WORK (no food or drink after midnight before the lab appointment, only water or coffee without cream/sugar on the morning of)  SCHEDULE "Lab Only" visit in the morning at the clinic for lab draw in 6 MONTHS   - Make sure Lab Only appointment is at about 1 week before your next appointment, so that results will be available  For Lab Results, once available within 2-3 days of blood draw, you can can log in to MyChart online to view your results and a brief explanation. Also, we can discuss results at next follow-up visit.

## 2019-01-07 ENCOUNTER — Telehealth: Payer: Self-pay | Admitting: Nurse Practitioner

## 2019-01-07 NOTE — Chronic Care Management (AMB) (Signed)
Chronic Care Management   Note  01/07/2019 Name: Tracey Carroll MRN: [id number] DOB: 1941-01-24  Tracey Carroll is a 78 y.o. year old female who is a primary care patient of Mikey College, NP. I reached out to [name] by phone today in response to a referral sent by [name] health plan.    [name] was given information about Chronic Care Management services today including:  1. CCM service includes personalized support from designated clinical staff supervised by her physician, including individualized plan of care and coordination with other care providers 2. 24/7 contact phone numbers for assistance for urgent and routine care needs. 3. Service will only be billed when office clinical staff spend 20 minutes or more in a month to coordinate care. 4. Only one practitioner may furnish and bill the service in a calendar month. 5. The patient may stop CCM services at any time (effective at the end of the month) by phone call to the office staff. 6. The patient will be responsible for cost sharing (co-pay) of up to 20% of the service fee (after annual deductible is met).  Patient agreed to services and verbal consent obtained.   Follow up plan: Telephone appointment with CCM team member scheduled for: 02/25/2019  [place]  [email or web address]   ??[phone]

## 2019-02-07 ENCOUNTER — Other Ambulatory Visit: Payer: Self-pay | Admitting: Nurse Practitioner

## 2019-02-07 DIAGNOSIS — K219 Gastro-esophageal reflux disease without esophagitis: Secondary | ICD-10-CM

## 2019-02-07 DIAGNOSIS — E7800 Pure hypercholesterolemia, unspecified: Secondary | ICD-10-CM

## 2019-02-07 DIAGNOSIS — I10 Essential (primary) hypertension: Secondary | ICD-10-CM

## 2019-02-07 NOTE — Telephone Encounter (Signed)
Needs medications refilled and sent in to [facility] on [address], changed ins and no longer receiving the mail order prescriptions.

## 2019-02-11 MED ORDER — OMEPRAZOLE 40 MG PO CPDR: 40.0000 mg | DELAYED_RELEASE_CAPSULE | Freq: Every morning | ORAL | 0 refills | Status: DC

## 2019-02-11 MED ORDER — LOSARTAN POTASSIUM 50 MG PO TABS: 50.0000 mg | ORAL_TABLET | Freq: Every day | ORAL | 0 refills | Status: DC

## 2019-02-11 MED ORDER — ATORVASTATIN CALCIUM 20 MG PO TABS: 20.0000 mg | ORAL_TABLET | Freq: Every day | ORAL | 0 refills | Status: DC

## 2019-02-11 MED ORDER — HYDROCHLOROTHIAZIDE 12.5 MG PO TABS: 12.5000 mg | ORAL_TABLET | Freq: Every day | ORAL | 0 refills | Status: DC

## 2019-02-25 ENCOUNTER — Telehealth: Payer: Medicare HMO

## 2019-02-25 ENCOUNTER — Ambulatory Visit: Payer: Self-pay | Admitting: *Deleted

## 2019-02-25 DIAGNOSIS — I10 Essential (primary) hypertension: Secondary | ICD-10-CM

## 2019-02-25 NOTE — Chronic Care Management (AMB) (Signed)
  Chronic Care Management   Outreach Note  02/25/2019 Name: Tracey Carroll MRN: [id number] DOB: 04-06-1941  Referred by: Mikey College, NP Reason for referral : Chronic Care Management (Unsuccessful outreach x1)   An unsuccessful telephone outreach was attempted today. The patient was referred to the case management team by for assistance with care management and care coordination.   Follow Up Plan: A HIPPA compliant phone message was NOT  left for the patient providing contact information and requesting a return call related to the voice mailbox had not been set up.  The care management team will reach out to the patient again over the next 30 days.   Merlene Morse Tyona Nilsen RN, BSN Nurse Case Pharmacist, [facility]  [phone]) [employer]

## 2019-03-05 ENCOUNTER — Other Ambulatory Visit: Payer: Self-pay

## 2019-03-05 ENCOUNTER — Ambulatory Visit (INDEPENDENT_AMBULATORY_CARE_PROVIDER_SITE_OTHER): Payer: Medicare Other | Admitting: Nurse Practitioner

## 2019-03-05 ENCOUNTER — Encounter: Payer: Self-pay | Admitting: Nurse Practitioner

## 2019-03-05 VITALS — BP 133/80 | HR 79 | Ht <= 58 in | Wt 186.2 lb

## 2019-03-05 DIAGNOSIS — R1310 Dysphagia, unspecified: Secondary | ICD-10-CM | POA: Diagnosis not present

## 2019-03-05 DIAGNOSIS — I700 Atherosclerosis of aorta: Secondary | ICD-10-CM | POA: Diagnosis not present

## 2019-03-05 DIAGNOSIS — K219 Gastro-esophageal reflux disease without esophagitis: Secondary | ICD-10-CM | POA: Diagnosis not present

## 2019-03-05 DIAGNOSIS — I10 Essential (primary) hypertension: Secondary | ICD-10-CM

## 2019-03-05 DIAGNOSIS — E782 Mixed hyperlipidemia: Secondary | ICD-10-CM

## 2019-03-05 MED ORDER — OMEPRAZOLE 40 MG PO CPDR: 40.0000 mg | DELAYED_RELEASE_CAPSULE | Freq: Two times a day (BID) | ORAL | 1 refills | Status: DC

## 2019-03-05 NOTE — Progress Notes (Signed)
Subjective:    Patient ID: Tracey Carroll, female    DOB: 12/18/1940, 78 y.o.   MRN: [id number]  Tracey Carroll is a 78 y.o. female presenting on 03/05/2019 for Hypertension and Dysphagia (pt state she is having some difficulty swallowing and its causing her choke more frequent while eating.)  HPI Hypertension - She is checking BP at home or outside of clinic.  Readings 110-115/60-70 - Current medications: hydrochlorothiazide 12.5 mg daily, losartan 50 mg daily, tolerating well without side effects - She is not currently symptomatic. - Pt denies headache, lightheadedness, dizziness, changes in vision, chest tightness/pressure, palpitations, leg swelling, sudden loss of speech or loss of consciousness. 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 - Her diet is moderate in salt, moderate in fat, and moderate in carbohydrates.   GERD/Dysphagia Patient currently takes omeprazole 40 mg daily.  Has burning at night more often than in past.   Patient's last meal is at 3pm, bedtime is about 8-9 pm.   - She reports no n/v, coffee ground emesis, dark/black/tarry stool, BRBPR, or other GI bleeding.   Dyslipidemia Patient is currently taking atorvastatin and tolerating well without myalgias. - Pt denies changes in vision, chest tightness/pressure, palpitations, shortness of breath, leg pain while walking, leg or arm weakness, and sudden loss of speech or loss of consciousness.  - Denies interval ASCVD events.  Social History   Tobacco Use  . Smoking status: Never Smoker  . Smokeless tobacco: Never Used  Substance Use Topics  . Alcohol use: No    Alcohol/week: 0.0 standard drinks  . Drug use: No    Review of Systems Per HPI unless specifically indicated above     Objective:    BP 133/80   Pulse 79   Ht 4\' 9"  (1.448 m)   Wt 186 lb 3.2 oz (84.5 kg)   BMI 40.29 kg/m   Wt Readings from Last 3 Encounters:  03/05/19 186 lb 3.2  oz (84.5 kg)  04/04/18 185 lb 4.8 oz (84.1 kg)  03/12/18 185 lb 9.6 oz (84.2 kg)    Physical Exam Vitals signs reviewed.  Constitutional:      General: She is not in acute distress.    Appearance: Normal appearance. She is well-developed. She is obese.  HENT:     Head: Normocephalic and atraumatic.  Cardiovascular:     Rate and Rhythm: Normal rate and regular rhythm.     Pulses:          Radial pulses are 2+ on the right side and 2+ on the left side.       Posterior tibial pulses are 1+ on the right side and 1+ on the left side.     Heart sounds: Normal heart sounds, S1 normal and S2 normal.  Pulmonary:     Effort: Pulmonary effort is normal. No respiratory distress.     Breath sounds: Normal breath sounds and air entry.  Abdominal:     General: Abdomen is flat. Bowel sounds are normal.     Palpations: Abdomen is soft.     Tenderness: There is no abdominal tenderness.  Musculoskeletal:     Right lower leg: No edema.     Left lower leg: No edema.  Skin:    General: Skin is warm and dry.     Capillary Refill: Capillary refill takes less than 2 seconds.  Neurological:     General: No focal deficit present.  Mental Status: She is alert and oriented to person, place, and time. Mental status is at baseline.  Psychiatric:        Attention and Perception: Attention normal.        Mood and Affect: Mood and affect normal.        Behavior: Behavior normal. Behavior is cooperative.        Thought Content: Thought content normal.        Judgment: Judgment normal.     Results for orders placed or performed in visit on 04/04/18  CULTURE, URINE COMPREHENSIVE   Specimen: Urine   BL  Result Value Ref Range   Urine Culture, Comprehensive Final report    Organism ID, Bacteria Comment   Microscopic Examination   URINE  Result Value Ref Range   WBC, UA None seen 0 - 5 /hpf   RBC, UA 0-2 0 - 2 /hpf   Epithelial Cells (non renal) 0-10 0 - 10 /hpf   Bacteria, UA Moderate (A) None  seen/Few  Urinalysis, Complete  Result Value Ref Range   Specific Gravity, UA 1.010 1.005 - 1.030   pH, UA 7.0 5.0 - 7.5   Color, UA Yellow Yellow   Appearance Ur Clear Clear   Leukocytes, UA Trace (A) Negative   Protein, UA Negative Negative/Trace   Glucose, UA Negative Negative   Ketones, UA Negative Negative   RBC, UA Trace (A) Negative   Bilirubin, UA Negative Negative   Urobilinogen, Ur 1.0 0.2 - 1.0 mg/dL   Nitrite, UA Negative Negative   Microscopic Examination See below:       Assessment & Plan:   Problem List Items Addressed This Visit      Cardiovascular and Mediastinum   Essential hypertension   Aortic atherosclerosis (HCC)   Relevant Orders   Lipid panel     Digestive   Gastroesophageal reflux disease   Relevant Medications   omeprazole (PRILOSEC) 40 MG capsule   Other Relevant Orders   Comprehensive metabolic panel   CBC with Differential/Platelet   Dysphagia - Primary   Relevant Orders   Ambulatory referral to Speech Therapy     Other   Mixed hyperlipidemia    Controlled hypertension.  BP goal < 130/80.  Pt is working on lifestyle modifications.  Taking medications tolerating well without side effects. No current complications.  Plan: 1. Continue taking medications without changes 2. Obtain labs today  3. Encouraged heart healthy diet and increasing exercise to 30 minutes most days of the week. 4. Check BP 1-2 x per week at home, keep log, and bring to clinic at next appointment. 5. Follow up 3 months.     Aortic atherosclerosis/Mixed hyperlipidemia  Previously stable, but current status unknown.  Recheck labs.  Continue meds without changes today as patient is tolerating well.  Refills provided. Followup 6 months and prn after labs.  GERD, History of esophageal stricture, Dysphagia  Patient with increased GERD symptoms over last several weeks.  Also has dysphagia with choking on food.  Similar presentation to last esophageal stricture.  Patient  s/p procedure several years ago.    Plan: 1. Increase omeprazole to 40 mg bid.  If not improving after 2 weeks call clinic. 2. Speech therapy for swallowing evaluation/therapy.   - Referral to GI after if needed for esophageal phase dysphagia. 3. Follow-up 3 months or sooner if needed.  Reviewed emergency signs and symptoms.   Meds ordered this encounter  Medications  . omeprazole (PRILOSEC) 40 MG capsule  Sig: Take 1 capsule (40 mg total) by mouth 2 (two) times daily.    Dispense:  180 capsule    Refill:  1    Order Specific Question:   Supervising Provider    Answer:   Olin Hauser [[id number]]   Follow up plan: Return in about 3 months (around 06/05/2019) for GERD, dysphagia.  Cassell Smiles, DNP, AGPCNP-BC Adult Gerontology Primary Care Nurse Practitioner [facility] 03/05/2019, 9:25 AM

## 2019-03-05 NOTE — Patient Instructions (Addendum)
Tracey Carroll,   Thank you for coming in to clinic today.  1. Increase your omeprazole to 40 mg twice daily.    2. Speech therapy will call you to evaluate your swallowing.  - You may need to see your GI doctor again.  3. Continue current blood pressure medications.  Please schedule a follow-up appointment. Return in about 3 months (around 06/05/2019) for GERD, dysphagia.  If you have any other questions or concerns, please feel free to call the clinic or send a message through [place]. You may also schedule an earlier appointment if necessary.  You will receive a survey after today's visit either digitally by e-mail or paper by [employer]. Your experiences and feedback matter to [place].  Please respond so we know how we are doing as we provide care for you.  Cassell Smiles, DNP, AGNP-BC Adult Gerontology Nurse Practitioner [place]

## 2019-03-06 ENCOUNTER — Other Ambulatory Visit: Payer: Self-pay | Admitting: Nurse Practitioner

## 2019-03-06 DIAGNOSIS — R1310 Dysphagia, unspecified: Secondary | ICD-10-CM

## 2019-03-06 LAB — LIPID PANEL
Cholesterol: 176 mg/dL (ref <200)
HDL: 58 mg/dL (ref >=50)
LDL Cholesterol (Calc): 95 mg/dL (calc)
Non-HDL Cholesterol (Calc): 118 mg/dL (calc) (ref <130)
Total CHOL/HDL Ratio: 3 (calc) (ref <5.0)
Triglycerides: 133 mg/dL (ref <150)

## 2019-03-06 LAB — COMPREHENSIVE METABOLIC PANEL
AG Ratio: 1.5 (calc) (ref 1.0–2.5)
ALT: 9 U/L (ref 6–29)
AST: 23 U/L (ref 10–35)
Albumin: 4.3 g/dL (ref 3.6–5.1)
Alkaline phosphatase (APISO): 58 U/L (ref 37–153)
BUN: 9 mg/dL (ref 7–25)
CO2: 28 mmol/L (ref 20–32)
Calcium: 9.6 mg/dL (ref 8.6–10.4)
Chloride: 103 mmol/L (ref 98–110)
Creat: 0.69 mg/dL (ref 0.60–0.93)
Globulin: 2.9 g/dL (calc) (ref 1.9–3.7)
Glucose, Bld: 94 mg/dL (ref 65–99)
Potassium: 4.3 mmol/L (ref 3.5–5.3)
Sodium: 140 mmol/L (ref 135–146)
Total Bilirubin: 0.7 mg/dL (ref 0.2–1.2)
Total Protein: 7.2 g/dL (ref 6.1–8.1)

## 2019-03-06 LAB — CBC WITH DIFFERENTIAL/PLATELET
Absolute Monocytes: 470 cells/uL (ref 200–950)
Basophils Absolute: 19 cells/uL (ref 0–200)
Basophils Relative: 0.4 %
Eosinophils Absolute: 29 cells/uL (ref 15–500)
Eosinophils Relative: 0.6 %
HCT: 35.7 % (ref 35.0–45.0)
Hemoglobin: 12.2 g/dL (ref 11.7–15.5)
Lymphs Abs: 1632 cells/uL (ref 850–3900)
MCH: 32.4 pg (ref 27.0–33.0)
MCHC: 34.2 g/dL (ref 32.0–36.0)
MCV: 94.7 fL (ref 80.0–100.0)
MPV: 11.7 fL (ref 7.5–12.5)
Monocytes Relative: 9.8 %
Neutro Abs: 2650 cells/uL (ref 1500–7800)
Neutrophils Relative %: 55.2 %
Platelets: 230 10*3/uL (ref 140–400)
RBC: 3.77 10*6/uL — ABNORMAL LOW (ref 3.80–5.10)
RDW: 12.4 % (ref 11.0–15.0)
Total Lymphocyte: 34 %
WBC: 4.8 10*3/uL (ref 3.8–10.8)

## 2019-03-12 ENCOUNTER — Encounter: Payer: Medicare HMO | Admitting: Nurse Practitioner

## 2019-03-12 ENCOUNTER — Ambulatory Visit: Payer: Medicare Other

## 2019-03-14 ENCOUNTER — Encounter: Payer: Medicare HMO | Admitting: Nurse Practitioner

## 2019-03-21 ENCOUNTER — Telehealth: Payer: Self-pay

## 2019-04-05 ENCOUNTER — Ambulatory Visit: Payer: Medicare HMO | Admitting: Urology

## 2019-04-09 NOTE — Progress Notes (Signed)
04/10/2019 9:10 PM   Tracey Carroll [birth year] [id number]  Referring provider: Mikey College, NP [place],  [place] [address]  Chief Complaint  Patient presents with  . Follow-up    HPI: Patient is a 78 year old female with a history of hematuria and vaginal atrophy who presents today for yearly follow up.  History of hematuria (high risk) Non-smoker.  She did undergo a hematuria work up in 2015 with CTU and cystoscopy.  No malignancies were found.  CTU on 01/13/2017 revealed no acute findings within the abdomen or pelvis.  No urinary tract calculi or other explanation for patient's hematuria. Aortic atherosclerosis.  Cysto on 02/28/2017 was NED.   She denied any gross hematuria.  Her UA today is negative for microscopic hematuria.  Vaginal atrophy She is not using the vaginal estrogen cream.  She denies any vaginal burning, vaginal discharge or vaginal itching.  She states she is experiencing burning after urination that started about 2 months ago.  Patient denies any gross hematuria, dysuria or suprapubic/flank pain.  Patient denies any fevers, chills, nausea or vomiting.   UA is positive for +1 bilirubin, +1 ketones, trace protein and a few bacteria.  PMH: Past Medical History:  Diagnosis Date  . Acid reflux 10/19/2014  . Arthritis    arms  . Back pain, chronic 10/19/2014  . Dysphagia 10/20/2014  . Elevated BP 10/20/2014  . Fibroids, intramural 10/19/2014  . FOM (frequency of micturition) 10/19/2014  . GERD (gastroesophageal reflux disease)   . Hyperlipidemia   . 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 LBP (low back pain) 10/19/2014    Surgical History: Past Surgical History:  Procedure Laterality Date  . ESOPHAGOGASTRODUODENOSCOPY (EGD) WITH PROPOFOL N/A 11/20/2014   Procedure: ESOPHAGOGASTRODUODENOSCOPY (EGD) WITH PROPOFOL with dilation;  Surgeon: Lucilla Lame, MD;  Location: [facility];  Service: Endoscopy;  Laterality: N/A;  . TUBAL LIGATION      Home  Medications:  Allergies as of 04/10/2019   No Known Allergies     Medication List       Accurate as of April 10, 2019  9:10 PM. If you have any questions, ask your nurse or doctor.        atorvastatin 20 MG tablet Commonly known as: LIPITOR Take 1 tablet (20 mg total) by mouth daily.   hydrochlorothiazide 12.5 MG tablet Commonly known as: HYDRODIURIL Take 1 tablet (12.5 mg total) by mouth daily.   losartan 50 MG tablet Commonly known as: COZAAR Take 1 tablet (50 mg total) by mouth daily.   omeprazole 40 MG capsule Commonly known as: PRILOSEC Take 1 capsule (40 mg total) by mouth 2 (two) times daily.       Allergies: No Known Allergies  Family History: Family History  Problem Relation Age of Onset  . Diabetes Father        in her 10 and 75s  . Hypertension Daughter        her 68s  . Kidney cancer Neg Hx   . Bladder Cancer Neg Hx     Social History:  reports that she has never smoked. She has never used smokeless tobacco. She reports that she does not drink alcohol or use drugs.  ROS: UROLOGY Frequent Urination?: No Hard to postpone urination?: No Burning/pain with urination?: No Get up at night to urinate?: No Leakage of urine?: No Urine stream starts and stops?: No Trouble starting stream?: No Do you have to strain to urinate?: No Blood in  urine?: No Urinary tract infection?: No Sexually transmitted disease?: No Injury to kidneys or bladder?: No Painful intercourse?: No Weak stream?: No Currently pregnant?: No Vaginal bleeding?: No Last menstrual period?: n  Gastrointestinal Nausea?: No Vomiting?: No Indigestion/heartburn?: No Diarrhea?: No Constipation?: No  Constitutional Fever: No Night sweats?: Yes Weight loss?: No Fatigue?: No  Skin Skin rash/lesions?: No Itching?: No  Eyes Blurred vision?: No Double vision?: No  Ears/Nose/Throat Sore throat?: No Sinus problems?: No  Hematologic/Lymphatic Swollen glands?: No Easy  bruising?: No  Cardiovascular Leg swelling?: No Chest pain?: No  Respiratory Cough?: No Shortness of breath?: No  Endocrine Excessive thirst?: No  Musculoskeletal Back pain?: Yes Joint pain?: No  Neurological Headaches?: No Dizziness?: No  Psychologic Depression?: No Anxiety?: No  Physical Exam: BP 132/84   Pulse (!) 103   Ht 4\' 9"  (1.448 m)   Wt 178 lb (80.7 kg)   BMI 38.52 kg/m   Constitutional:  Well nourished. Alert and oriented, No acute distress. HEENT: [place] AT, moist mucus membranes.  Trachea midline, no masses. Cardiovascular: No clubbing, cyanosis, or edema. Respiratory: Normal respiratory effort, no increased work of breathing. GI: Abdomen is soft, non tender, non distended, no abdominal masses. Liver and spleen not palpable.  No hernias appreciated.  Stool sample for occult testing is not indicated.   GU: No CVA tenderness.  No bladder fullness or masses.  Atrophic external genitalia, sparse pubic hair distribution, no lesions.  Urethral caruncle noted.  No urethral masses, tenderness and/or tenderness. No bladder fullness, tenderness or masses. Pale vagina mucosa, poor estrogen effect, no discharge, no lesions, fair pelvic support, grade I cystocele and no rectocele noted.  No cervical motion tenderness.  Uterus is freely mobile and non-fixed.  No adnexal/parametria masses or tenderness noted.  Anus and perineum are without rashes or lesions.    Skin: No rashes, bruises or suspicious lesions. Lymph: No inguinal adenopathy. Neurologic: Grossly intact, no focal deficits, moving all 4 extremities. Psychiatric: Normal mood and affect.   Laboratory Data: Lab Results  Component Value Date   CREATININE 0.69 03/05/2019    Lab Results  Component Value Date   AST 23 03/05/2019   Lab Results  Component Value Date   ALT 9 03/05/2019    Urinalysis Component     Latest Ref Rng & Units 04/10/2019  Specific Gravity, UA     1.005 - 1.030 1.025  pH, UA     5.0  - 7.5 5.5  Color, UA     Yellow Orange  Appearance Ur     Clear Cloudy (A)  Leukocytes,UA     Negative Negative  Protein,UA     Negative/Trace Trace (A)  Glucose, UA     Negative Negative  Ketones, UA     Negative 1+ (A)  RBC, UA     Negative Trace (A)  Bilirubin, UA     Negative Positive (A)  Urobilinogen, Ur     0.2 - 1.0 mg/dL 1.0  Nitrite, UA     Negative Negative  Microscopic Examination      See below:   Component     Latest Ref Rng & Units 04/10/2019  WBC, UA     0 - 5 /hpf None seen  RBC     0 - 2 /hpf 0-2  Epithelial Cells (non renal)     0 - 10 /hpf None seen  Bacteria, UA     None seen/Few Few   I have reviewed the labs.   Assessment &  Plan:    1. Burning after urination UA bland ? Due to irritation of atrophic vaginal mucosa Will initiate vaginal estrogen cream and reassess in 2 weeks  2. History of hematuria Hematuria work up completed in 2015 and 02/2017 - NED No report of gross hematuria  UA today negative for hematuria RTC in one year for UA - patient to report any gross hematuria in the interim    3. Vaginal atrophy Explained to the patient that her menopausal state causes her vaginal tissue to become dry, inflamed and thin and this is most likely the cause of her burning after urination Patient was given a sample of vaginal estrogen cream (Premarin vaginal cream) and instructed to apply 0.5mg  (pea-sized amount)  just inside the vaginal introitus with a finger-tip on every night for 2 weeks and then Monday, Wednesday and Friday nights.   RTC in 2 weeks for exam and symptom recheck  Return in about 2 weeks (around 04/24/2019) for exam.  These notes generated with voice recognition software. I apologize for typographical errors.  Zara Council, PA-C  [facility] [address] [place], [place] [address] [phone]

## 2019-04-10 ENCOUNTER — Encounter: Payer: Self-pay | Admitting: Urology

## 2019-04-10 ENCOUNTER — Ambulatory Visit (INDEPENDENT_AMBULATORY_CARE_PROVIDER_SITE_OTHER): Payer: Medicare Other | Admitting: Urology

## 2019-04-10 ENCOUNTER — Other Ambulatory Visit: Payer: Self-pay

## 2019-04-10 VITALS — BP 132/84 | HR 103 | Ht <= 58 in | Wt 178.0 lb

## 2019-04-10 DIAGNOSIS — N952 Postmenopausal atrophic vaginitis: Secondary | ICD-10-CM | POA: Diagnosis not present

## 2019-04-10 DIAGNOSIS — Z87448 Personal history of other diseases of urinary system: Secondary | ICD-10-CM

## 2019-04-10 DIAGNOSIS — R300 Dysuria: Secondary | ICD-10-CM | POA: Diagnosis not present

## 2019-04-10 LAB — MICROSCOPIC EXAMINATION
Epithelial Cells (non renal): NONE SEEN /hpf (ref 0–10)
WBC, UA: NONE SEEN /hpf (ref 0–5)

## 2019-04-10 LAB — URINALYSIS, COMPLETE
Bilirubin, UA: POSITIVE — AB
Glucose, UA: NEGATIVE
Leukocytes,UA: NEGATIVE
Nitrite, UA: NEGATIVE
Specific Gravity, UA: 1.025 (ref 1.005–1.030)
Urobilinogen, Ur: 1 mg/dL (ref 0.2–1.0)
pH, UA: 5.5 (ref 5.0–7.5)

## 2019-04-10 NOTE — Patient Instructions (Signed)
   You are given a sample of vaginal estrogen cream Premarin and instructed to apply 0.5mg  (pea-sized amount)  just inside the vaginal introitus with a finger-tip for two weeks, than on Monday, Wednesday and Friday nights,

## 2019-04-15 ENCOUNTER — Telehealth: Payer: Self-pay

## 2019-04-16 ENCOUNTER — Other Ambulatory Visit: Payer: Self-pay | Admitting: Nurse Practitioner

## 2019-04-16 DIAGNOSIS — E7800 Pure hypercholesterolemia, unspecified: Secondary | ICD-10-CM

## 2019-04-17 ENCOUNTER — Other Ambulatory Visit: Payer: Self-pay

## 2019-04-17 ENCOUNTER — Encounter: Payer: Self-pay | Admitting: Family Medicine

## 2019-04-17 ENCOUNTER — Ambulatory Visit (INDEPENDENT_AMBULATORY_CARE_PROVIDER_SITE_OTHER): Payer: Medicare Other | Admitting: Family Medicine

## 2019-04-17 DIAGNOSIS — E782 Mixed hyperlipidemia: Secondary | ICD-10-CM

## 2019-04-17 DIAGNOSIS — I10 Essential (primary) hypertension: Secondary | ICD-10-CM

## 2019-04-17 MED ORDER — LOSARTAN POTASSIUM 50 MG PO TABS: 50.0000 mg | ORAL_TABLET | Freq: Every day | ORAL | 0 refills | Status: DC

## 2019-04-17 MED ORDER — HYDROCHLOROTHIAZIDE 12.5 MG PO TABS: 12.5000 mg | ORAL_TABLET | Freq: Every day | ORAL | 0 refills | Status: DC

## 2019-04-17 NOTE — Assessment & Plan Note (Signed)
Controlled Hyperlipidemia on Statin Has side effect myalgia on Atorva 20mg  daily  Refilled already as requested by pharmacy. Advised today she can take HALF pill for dose 10mg  daily, OR switch it to every OTHER day for now, advised that she can follow-up in future on this and discuss options more, ideally would find alternative statin or appropriate dose to take daily for best benefit but if no dose is without myalgia then can try the intermittent dosing regimen longer term, will need new rx with new instructions based on what her results are after this trial

## 2019-04-17 NOTE — Assessment & Plan Note (Signed)
Well-controlled HTN - Home BP readings normal  No known complications - still mild side effect of intermittent transient dizziness    Plan:  1. Continue current BP regimen - Losartan 50mg  daily, HCTZ 12.5mg  daily 2. Encourage improved lifestyle - low sodium diet, regular exercise 3. Continue monitor BP outside office, bring readings to next visit, if persistently >140/90 or new symptoms notify office sooner  Advised her that we could consider reducing Thiazide diuretic once more and discontinue it or take half pill in future - if still experiencing dizziness

## 2019-04-17 NOTE — Progress Notes (Signed)
Virtual Visit via Telephone The purpose of this virtual visit is to provide medical care while limiting exposure to the novel coronavirus (COVID19) for both patient and office staff.  Consent was obtained for phone visit:  Yes.   Answered questions that patient had about telehealth interaction:  Yes.   I discussed the limitations, risks, security and privacy concerns of performing an evaluation and management service by telephone. I also discussed with the patient that there may be a patient responsible charge related to this service. The patient expressed understanding and agreed to proceed.  Patient Location: Home Provider Location: [facility])  ---------------------------------------------------------------------- Chief Complaint  Patient presents with  . Hypertension    B/P 112/80 yesterday    S: Reviewed CMA documentation. I have called patient and gathered additional HPI as follows:  CHRONIC HTN: Reports she checks home BP readings 110 to 120/80s on average. Current Meds - Losartan 50mg  daily, HCTZ 12.5mg  daily   Reports good compliance, took meds today. Tolerating well, w/ one side effect of dizziness. Lifestyle: - Diet: balanced, goal to lower salt - Exercise: limited, active around house Denies CP, dyspnea, HA, edema, dizziness / lightheadedness  HYPERLIPIDEMIA: - Reports concern with myalgia or muscle ache on statin . Last lipid panel 02/2019, controlled  - Currently taking Atorvastatin 20mg  daily   Denies any high risk travel to areas of current concern for COVID19. Denies any known or suspected exposure to person with or possibly with COVID19.  Denies any fevers, chills, sweats, body ache, cough, shortness of breath, sinus pain or pressure, headache, abdominal pain, diarrhea  Past Medical History:  Diagnosis Date  . Acid reflux 10/19/2014  . Arthritis    arms  . Back pain, chronic 10/19/2014  . Dysphagia 10/20/2014  . Elevated BP 10/20/2014   . Fibroids, intramural 10/19/2014  . FOM (frequency of micturition) 10/19/2014  . GERD (gastroesophageal reflux disease)   . Hyperlipidemia   . 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 LBP (low back pain) 10/19/2014   Social History   Tobacco Use  . Smoking status: Never Smoker  . Smokeless tobacco: Never Used  Substance Use Topics  . Alcohol use: No    Alcohol/week: 0.0 standard drinks  . Drug use: No    Current Outpatient Medications:  .  atorvastatin (LIPITOR) 20 MG tablet, TAKE 1 TABLET BY MOUTH ONCE DAILY. APPOINTMENT NEEDED FOR FURTHER REFILLS., Disp: 90 tablet, Rfl: 0 .  hydrochlorothiazide (HYDRODIURIL) 12.5 MG tablet, Take 1 tablet (12.5 mg total) by mouth daily., Disp: 90 tablet, Rfl: 0 .  losartan (COZAAR) 50 MG tablet, Take 1 tablet (50 mg total) by mouth daily., Disp: 90 tablet, Rfl: 0 .  omeprazole (PRILOSEC) 40 MG capsule, Take 1 capsule (40 mg total) by mouth 2 (two) times daily., Disp: 180 capsule, Rfl: 1  Depression screen [facility] 2/9 04/17/2019 09/12/2018 03/12/2018  Decreased Interest 0 0 0  Down, Depressed, Hopeless 0 0 0  PHQ - 2 Score 0 0 0    No flowsheet data found.  -------------------------------------------------------------------------- O: No physical exam performed due to remote telephone encounter.  Lab results reviewed.  Recent Results (from the past 2160 hour(s))  Comprehensive metabolic panel     Status: None   Collection Time: 03/05/19  9:47 AM  Result Value Ref Range   Glucose, Bld 94 65 - 99 mg/dL    Comment: .            Fasting reference interval .    BUN 9 7 -  25 mg/dL   Creat 0.69 0.60 - 0.93 mg/dL    Comment: For patients >66 years of age, the reference limit for Creatinine is approximately 13% higher for people identified as African-American. .    BUN/Creatinine Ratio NOT APPLICABLE 6 - 22 (calc)   Sodium 140 135 - 146 mmol/L   Potassium 4.3 3.5 - 5.3 mmol/L   Chloride 103 98 - 110 mmol/L   CO2 28 20 - 32 mmol/L   Calcium 9.6 8.6 -  10.4 mg/dL   Total Protein 7.2 6.1 - 8.1 g/dL   Albumin 4.3 3.6 - 5.1 g/dL   Globulin 2.9 1.9 - 3.7 g/dL (calc)   AG Ratio 1.5 1.0 - 2.5 (calc)   Total Bilirubin 0.7 0.2 - 1.2 mg/dL   Alkaline phosphatase (APISO) 58 37 - 153 U/L   AST 23 10 - 35 U/L   ALT 9 6 - 29 U/L  Lipid panel     Status: None   Collection Time: 03/05/19  9:47 AM  Result Value Ref Range   Cholesterol 176 <200 mg/dL   HDL 58 > OR = 50 mg/dL   Triglycerides 133 <150 mg/dL   LDL Cholesterol (Calc) 95 mg/dL (calc)    Comment: Reference range: <100 . Desirable range <100 mg/dL for primary prevention;   <70 mg/dL for patients with CHD or diabetic patients  with > or = 2 CHD risk factors. Marland Kitchen LDL-C is now calculated using the Martin-Hopkins  calculation, which is a validated novel method providing  better accuracy than the Friedewald equation in the  estimation of LDL-C.  Cresenciano Genre et al. Annamaria Helling. [id number]): 2061-2068  (http://education.[employer].com/faq/FAQ164)    Total CHOL/HDL Ratio 3.0 <5.0 (calc)   Non-HDL Cholesterol (Calc) 118 <130 mg/dL (calc)    Comment: For patients with diabetes plus 1 major ASCVD risk  factor, treating to a non-HDL-C goal of <100 mg/dL  (LDL-C of <70 mg/dL) is considered a therapeutic  option.   CBC with Differential/Platelet     Status: Abnormal   Collection Time: 03/05/19  9:47 AM  Result Value Ref Range   WBC 4.8 3.8 - 10.8 Thousand/uL   RBC 3.77 (L) 3.80 - 5.10 Million/uL   Hemoglobin 12.2 11.7 - 15.5 g/dL   HCT 35.7 35.0 - 45.0 %   MCV 94.7 80.0 - 100.0 fL   MCH 32.4 27.0 - 33.0 pg   MCHC 34.2 32.0 - 36.0 g/dL   RDW 12.4 11.0 - 15.0 %   Platelets 230 140 - 400 Thousand/uL   MPV 11.7 7.5 - 12.5 fL   Neutro Abs 2,650 1,500 - 7,800 cells/uL   Lymphs Abs 1,632 850 - 3,900 cells/uL   Absolute Monocytes 470 200 - 950 cells/uL   Eosinophils Absolute 29 15 - 500 cells/uL   Basophils Absolute 19 0 - 200 cells/uL   Neutrophils Relative % 55.2 %   Total Lymphocyte 34.0 %    Monocytes Relative 9.8 %   Eosinophils Relative 0.6 %   Basophils Relative 0.4 %  Urinalysis, Complete     Status: Abnormal   Collection Time: 04/10/19  9:59 AM  Result Value Ref Range   Specific Gravity, UA 1.025 1.005 - 1.030   pH, UA 5.5 5.0 - 7.5   Color, UA Orange Yellow   Appearance Ur Cloudy (A) Clear   Leukocytes,UA Negative Negative   Protein,UA Trace (A) Negative/Trace   Glucose, UA Negative Negative   Ketones, UA 1+ (A) Negative   RBC, UA Trace (A) Negative  Bilirubin, UA Positive (A) Negative    Comment: Positive results have been confirmed.   Urobilinogen, Ur 1.0 0.2 - 1.0 mg/dL   Nitrite, UA Negative Negative   Microscopic Examination See below:   Microscopic Examination     Status: None   Collection Time: 04/10/19  9:59 AM   URINE  Result Value Ref Range   WBC, UA None seen 0 - 5 /hpf   RBC 0-2 0 - 2 /hpf   Epithelial Cells (non renal) None seen 0 - 10 /hpf   Bacteria, UA Few None seen/Few    -------------------------------------------------------------------------- A&P:  Problem List Items Addressed This Visit    Mixed hyperlipidemia - Primary    Controlled Hyperlipidemia on Statin Has side effect myalgia on Atorva 20mg  daily  Refilled already as requested by pharmacy. Advised today she can take HALF pill for dose 10mg  daily, OR switch it to every OTHER day for now, advised that she can follow-up in future on this and discuss options more, ideally would find alternative statin or appropriate dose to take daily for best benefit but if no dose is without myalgia then can try the intermittent dosing regimen longer term, will need new rx with new instructions based on what her results are after this trial      Relevant Medications   hydrochlorothiazide (HYDRODIURIL) 12.5 MG tablet   losartan (COZAAR) 50 MG tablet   Essential hypertension    Well-controlled HTN - Home BP readings normal  No known complications - still mild side effect of intermittent  transient dizziness    Plan:  1. Continue current BP regimen - Losartan 50mg  daily, HCTZ 12.5mg  daily 2. Encourage improved lifestyle - low sodium diet, regular exercise 3. Continue monitor BP outside office, bring readings to next visit, if persistently >140/90 or new symptoms notify office sooner  Advised her that we could consider reducing Thiazide diuretic once more and discontinue it or take half pill in future - if still experiencing dizziness      Relevant Medications   hydrochlorothiazide (HYDRODIURIL) 12.5 MG tablet   losartan (COZAAR) 50 MG tablet      Meds ordered this encounter  Medications  . hydrochlorothiazide (HYDRODIURIL) 12.5 MG tablet    Sig: Take 1 tablet (12.5 mg total) by mouth daily.    Dispense:  90 tablet    Refill:  0  . losartan (COZAAR) 50 MG tablet    Sig: Take 1 tablet (50 mg total) by mouth daily.    Dispense:  90 tablet    Refill:  0    Follow-up: - Return in 3 months for HTN HLD follow up w/ new provider  Patient verbalizes understanding with the above medical recommendations including the limitation of remote medical advice.  Specific follow-up and call-back criteria were given for patient to follow-up or seek medical care more urgently if needed.   - Time spent in direct consultation with patient on phone: 8 minutes  Nobie Putnam, [facility] 04/17/2019, 2:46 PM

## 2019-04-25 NOTE — Progress Notes (Signed)
04/26/2019 9:25 PM   Tracey Carroll [birth year] [id number]  Referring provider: No referring provider defined for this encounter.  Chief Complaint  Patient presents with  . Follow-up    HPI: Patient is a 78 year old female with a history of hematuria and vaginal atrophy who presents today for follow up.  History of hematuria (high risk) Non-smoker.  She did undergo a hematuria work up in 2015 with CTU and cystoscopy.  No malignancies were found.  CTU on 01/13/2017 revealed no acute findings within the abdomen or pelvis.  No urinary tract calculi or other explanation for patient's hematuria. Aortic atherosclerosis.  Cysto on 02/28/2017 was NED.   She denied any gross hematuria.  Follow up UA was negative for microscopic hematuria.  Vaginal atrophy She states that she apply the vaginal cream every night for the last 2 weeks and the burning after urination has subsided    PMI: Past Medical History:  Diagnosis Date  . Acid reflux 10/19/2014  . Arthritis    arms  . Back pain, chronic 10/19/2014  . Dysphagia 10/20/2014  . Elevated BP 10/20/2014  . Fibroids, intramural 10/19/2014  . FOM (frequency of micturition) 10/19/2014  . GERD (gastroesophageal reflux disease)   . Hyperlipidemia   . 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 LBP (low back pain) 10/19/2014    Surgical History: Past Surgical History:  Procedure Laterality Date  . ESOPHAGOGASTRODUODENOSCOPY (EGD) WITH PROPOFOL N/A 11/20/2014   Procedure: ESOPHAGOGASTRODUODENOSCOPY (EGD) WITH PROPOFOL with dilation;  Surgeon: Lucilla Lame, MD;  Location: [facility];  Service: Endoscopy;  Laterality: N/A;  . TUBAL LIGATION      Home Medications:  Allergies as of 04/26/2019   No Known Allergies     Medication List       Accurate as of April 26, 2019 11:59 PM. If you have any questions, ask your nurse or doctor.        atorvastatin 20 MG tablet Commonly known as: LIPITOR TAKE 1 TABLET BY MOUTH ONCE DAILY. APPOINTMENT  NEEDED FOR FURTHER REFILLS.   hydrochlorothiazide 12.5 MG tablet Commonly known as: HYDRODIURIL Take 1 tablet (12.5 mg total) by mouth daily.   losartan 50 MG tablet Commonly known as: COZAAR Take 1 tablet (50 mg total) by mouth daily.   omeprazole 40 MG capsule Commonly known as: PRILOSEC Take 1 capsule (40 mg total) by mouth 2 (two) times daily.   Premarin vaginal cream Generic drug: conjugated estrogens Apply 0.5mg  (pea-sized amount)  just inside the vaginal introitus with a finger-tip on  Monday, Wednesday and Friday nights. Started by: Zara Council, PA-C       Allergies: No Known Allergies  Family History: Family History  Problem Relation Age of Onset  . Diabetes Father        in her 32 and 38s  . Hypertension Daughter        her 77s  . Kidney cancer Neg Hx   . Bladder Cancer Neg Hx     Social History:  reports that she has never smoked. She has never used smokeless tobacco. She reports that she does not drink alcohol or use drugs.  ROS: UROLOGY Frequent Urination?: No Hard to postpone urination?: No Burning/pain with urination?: No Get up at night to urinate?: No Leakage of urine?: No Urine stream starts and stops?: No Trouble starting stream?: No Do you have to strain to urinate?: No Blood in urine?: No Urinary tract infection?: No Sexually transmitted disease?: No Injury to kidneys or bladder?:  No Painful intercourse?: No Weak stream?: No Currently pregnant?: No Vaginal bleeding?: No Last menstrual period?: n  Gastrointestinal Nausea?: No Vomiting?: No Indigestion/heartburn?: No Diarrhea?: No Constipation?: No  Constitutional Fever: No Night sweats?: No Weight loss?: No Fatigue?: No  Skin Skin rash/lesions?: No Itching?: No  Eyes Blurred vision?: No Double vision?: No  Ears/Nose/Throat Sore throat?: No Sinus problems?: No  Hematologic/Lymphatic Swollen glands?: No Easy bruising?: No  Cardiovascular Leg swelling?:  No Chest pain?: No  Respiratory Cough?: No Shortness of breath?: No  Endocrine Excessive thirst?: No  Musculoskeletal Back pain?: No Joint pain?: No  Neurological Headaches?: No Dizziness?: No  Psychologic Depression?: No Anxiety?: No  Physical Exam: BP 126/75   Pulse 94   Ht 4\' 9"  (1.448 m)   Wt 180 lb 6.4 oz (81.8 kg)   BMI 39.04 kg/m   Constitutional:  Well nourished. Alert and oriented, No acute distress. HEENT: [place] AT, moist mucus membranes.  Trachea midline, no masses. Cardiovascular: No clubbing, cyanosis, or edema. Respiratory: Normal respiratory effort, no increased work of breathing. Neurologic: Grossly intact, no focal deficits, moving all 4 extremities. Psychiatric: Normal mood and affect.   Laboratory Data: Lab Results  Component Value Date   CREATININE 0.69 03/05/2019    Lab Results  Component Value Date   AST 23 03/05/2019   Lab Results  Component Value Date   ALT 9 03/05/2019    Urinalysis Component     Latest Ref Rng & Units 04/10/2019  Specific Gravity, UA     1.005 - 1.030 1.025  pH, UA     5.0 - 7.5 5.5  Color, UA     Yellow Orange  Appearance Ur     Clear Cloudy (A)  Leukocytes,UA     Negative Negative  Protein,UA     Negative/Trace Trace (A)  Glucose, UA     Negative Negative  Ketones, UA     Negative 1+ (A)  RBC, UA     Negative Trace (A)  Bilirubin, UA     Negative Positive (A)  Urobilinogen, Ur     0.2 - 1.0 mg/dL 1.0  Nitrite, UA     Negative Negative  Microscopic Examination      See below:   Component     Latest Ref Rng & Units 04/10/2019  WBC, UA     0 - 5 /hpf None seen  RBC     0 - 2 /hpf 0-2  Epithelial Cells (non renal)     0 - 10 /hpf None seen  Bacteria, UA     None seen/Few Few   I have reviewed the labs.   Assessment & Plan:    1. Burning after urination Resolved  2. History of hematuria Hematuria work up completed in 2015 and 02/2017 - NED No report of gross hematuria  Repeat UA  negative for hematuria RTC in one year for UA - patient to report any gross hematuria in the interim    3. Vaginal atrophy Patient will continue the vaginal estrogen cream on Monday, Wednesday and Friday nights She will follow-up in 1 year for exam  Return in about 1 year (around 04/25/2020) for OAB questionnaire, PVR and exam.  These notes generated with voice recognition software. I apologize for typographical errors.  Zara Council, PA-C  [facility] [address] [place],  [address] [phone]

## 2019-04-26 ENCOUNTER — Encounter: Payer: Self-pay | Admitting: Urology

## 2019-04-26 ENCOUNTER — Other Ambulatory Visit: Payer: Self-pay

## 2019-04-26 ENCOUNTER — Ambulatory Visit (INDEPENDENT_AMBULATORY_CARE_PROVIDER_SITE_OTHER): Payer: Medicare Other | Admitting: Urology

## 2019-04-26 VITALS — BP 126/75 | HR 94 | Ht <= 58 in | Wt 180.4 lb

## 2019-04-26 DIAGNOSIS — N952 Postmenopausal atrophic vaginitis: Secondary | ICD-10-CM

## 2019-04-26 MED ORDER — PREMARIN 0.625 MG/GM VA CREA: TOPICAL_CREAM | VAGINAL | 12 refills | Status: DC

## 2019-04-26 NOTE — Patient Instructions (Signed)
I have given you a prescription for a vaginal estrogen cream, Premarin.  Apply 0.5mg  (pea-sized amount)  just inside the vaginal introitus with a finger-tip on Monday, Wednesday and Friday nights,

## 2019-05-27 ENCOUNTER — Telehealth: Payer: Self-pay

## 2019-07-01 ENCOUNTER — Telehealth: Payer: Self-pay | Admitting: General Practice

## 2019-07-01 ENCOUNTER — Ambulatory Visit (INDEPENDENT_AMBULATORY_CARE_PROVIDER_SITE_OTHER): Payer: Medicare Other | Admitting: General Practice

## 2019-07-01 DIAGNOSIS — E782 Mixed hyperlipidemia: Secondary | ICD-10-CM

## 2019-07-01 DIAGNOSIS — I10 Essential (primary) hypertension: Secondary | ICD-10-CM | POA: Diagnosis not present

## 2019-07-01 DIAGNOSIS — R1310 Dysphagia, unspecified: Secondary | ICD-10-CM

## 2019-07-01 DIAGNOSIS — K219 Gastro-esophageal reflux disease without esophagitis: Secondary | ICD-10-CM

## 2019-07-01 NOTE — Chronic Care Management (AMB) (Signed)
Chronic Care Management   Initial Visit Note  07/01/2019 Name: Tracey Carroll MRN: [id number] DOB: 1941/02/10  Referred by: [employer] [employer], Tracey Carroll (Inactive) Reason for referral : Chronic Care Management (Initial: HTN/HLD/Swallowing Issues)   Tracey Carroll is a 79 y.o. year old female who is a primary care patient of [employer] [employer], Tracey Carroll (Inactive). The CCM team was consulted for assistance with chronic disease management and care coordination needs related to HTN, HLD and GERD with swallowing issues  Review of patient status, including review of consultants reports, relevant laboratory and other test results, and collaboration with appropriate care team members and the patient's provider was performed as part of comprehensive patient evaluation and provision of chronic care management services.    SDOH (Social Determinants of Health) screening performed today: Biomedical engineer  Food Insecurity  Stress. See Care Plan for related entries.   Medications: Outpatient Encounter Medications as of 07/01/2019  Medication Sig  . atorvastatin (LIPITOR) 20 MG tablet TAKE 1 TABLET BY MOUTH ONCE DAILY. APPOINTMENT NEEDED FOR FURTHER REFILLS.  Marland Kitchen conjugated estrogens (PREMARIN) vaginal cream Apply 0.'5mg'$  (pea-sized amount)  just inside the vaginal introitus with a finger-tip on  Monday, Wednesday and Friday nights.  . hydrochlorothiazide (HYDRODIURIL) 12.5 MG tablet Take 1 tablet (12.5 mg total) by mouth daily.  Marland Kitchen losartan (COZAAR) 50 MG tablet Take 1 tablet (50 mg total) by mouth daily.  Marland Kitchen omeprazole (PRILOSEC) 40 MG capsule Take 1 capsule (40 mg total) by mouth 2 (two) times daily.   No facility-administered encounter medications on file as of 07/01/2019.     Objective:  Lab Results  Component Value Date   CHOL 176 03/05/2019   HDL 58 03/05/2019   LDLCALC 95 03/05/2019   TRIG 133 03/05/2019   CHOLHDL 3.0 03/05/2019   BP Readings from Last 3 Encounters:    04/26/19 126/75  04/10/19 132/84  03/05/19 133/80    Goals Addressed            This Visit's Progress   . RNCM: I am have difficulty swallowing       Current Barriers:  Marland Kitchen Knowledge Deficits related to swallowing difficulties and how to obtain care   Nurse Case Manager Clinical Goal(s):  Marland Kitchen Over the next 90 days, patient will verbalize understanding of plan for seeing the new Tracey Carroll at [facility] for an appointment to evaluate and recommend treatment for issues the patient is having with swallowing . Over the next 90 days, patient will work with Tracey Carroll and pcp to address needs related to swallowing problems and referrals as needed to evaluate and treat swallowing issues  . Over the next 30 to 60 days, patient will attend all scheduled medical appointments: staff to call and make an appointment with the pcp to be seen . Over the next 90 days, the patient will demonstrate ongoing self health care management ability as evidenced by improved swallowing and no problems related to ineffective airway clearance   Interventions:  . Evaluation of current treatment plan related to GERD and patient's adherence to plan as established by provider. . Provided education to patient re: to process of obtaining appointment with pcp for evaluation, recommended the patient write down questions to ask the pcp. The patient has had her esophagus stretched x 2 in the past . Reviewed medications with patient and discussed compliance  . Collaborated with office staff at [facility] regarding setting up and appointment for the patient to come in and have an evaluation for  swallowing issues . Discussed plans with patient for ongoing care management follow up and provided patient with direct contact information for care management team . Provided patient with GERD educational materials related to swallowing difficulties  . Reviewed scheduled/upcoming provider appointments including:   Patient Self Care Activities:  . Patient  verbalizes understanding of plan to work with the CCM team to meet her health and wellness needs  . Self administers medications as prescribed . Attends all scheduled provider appointments . Calls provider office for new concerns or questions . Unable to independently manage GERD signs and symptoms as evidence by difficulty swallowing   Initial goal documentation     . RNCM: My blood pressure and cholesterol is better since I take medications       Current Barriers:  . Chronic Disease Management support, education, and care coordination needs related to HTN and HLD  Clinical Goal(s) related to HTN and HLD:  Over the next 90 days, patient will:  . Work with the care management team to address educational, disease management, and care coordination needs  . Begin or continue self health monitoring activities as directed today Measure and record blood pressure 5 times per week . Call provider office for new or worsened signs and symptoms Blood pressure findings outside established parameters and New or worsened symptom related to HLD and other Chronic disease processes  . Call care management team with questions or concerns . Verbalize basic understanding of patient centered plan of care established today  Interventions related to HTN and HLD:  . Evaluation of current treatment plans and patient's adherence to plan as established by provider . Assessed patient understanding of disease states . Assessed patient's education and care coordination needs . Provided disease specific education to patient  . Collaborated with appropriate clinical care team members regarding patient needs  Patient Self Care Activities related to HTN and HLD:  . Patient is unable to independently self-manage chronic health conditions  Initial goal documentation     . RNCM: Nobody told me what to do about that shot (COVID19)       Current Barriers:  Marland Kitchen Knowledge Deficits related to COVID-19 and impact on patient  self health management  Clinical Goal(s):  Marland Kitchen Over the next 30 days, patient will verbalize basic understanding of COVID-19 impact on individual health and self health management as evidenced by verbalization of basic understanding of COVID-19 as a viral disease, measures to prevent exposure, signs and symptoms, when to contact provider  Interventions: . Evaluation of current treatment plan related to [place] and patient's adherence to plan as established by provider. . Advised patient to discuss her questions and concerns about the COVID19 vaccine with her pcp . Provided patient with [place] educational materials related to vaccination and how to obtain  Patient Self Care Activities:  . Patient verbalizes understanding of plan to discuss the COVID19 vaccine at new pcp visit  . Self administers medications as prescribed . Attends all scheduled provider appointments . Calls provider office for new concerns or questions  Initial goal documentation                       Ms. Loyola was given information about Chronic Care Management services today including:  1. CCM service includes personalized support from designated clinical staff supervised by her physician, including individualized plan of care and coordination with other care providers 2. 24/7 contact phone numbers for assistance for urgent and routine care needs. 3. Service will  only be billed when office clinical staff spend 20 minutes or more in a month to coordinate care. 4. Only one practitioner may furnish and bill the service in a calendar month. 5. The patient may stop CCM services at any time (effective at the end of the month) by phone call to the office staff. 6. The patient will be responsible for cost sharing (co-pay) of up to 20% of the service fee (after annual deductible is met).  Patient agreed to services and verbal consent obtained.   Plan:   Telephone follow up appointment with care management team  member scheduled for: 08-15-2019 at 2 am  Noreene Larsson RN, MSN, [place] Mobile: [phone]

## 2019-07-01 NOTE — Patient Instructions (Signed)
Visit Information  Goals Addressed            This Visit's Progress    RNCM: I am have difficulty swallowing       Current Barriers:   Knowledge Deficits related to swallowing difficulties and how to obtain care   Nurse Case Manager Clinical Goal(s):   Over the next 90 days, patient will verbalize understanding of plan for seeing the new NP at [facility] for an appointment to evaluate and recommend treatment for issues the patient is having with swallowing  Over the next 90 days, patient will work with [facility] and pcp to address needs related to swallowing problems and referrals as needed to evaluate and treat swallowing issues   Over the next 30 to 60 days, patient will attend all scheduled medical appointments: staff to call and make an appointment with the pcp to be seen  Over the next 90 days, the patient will demonstrate ongoing self health care management ability as evidenced by improved swallowing and no problems related to ineffective airway clearance   Interventions:   Evaluation of current treatment plan related to GERD and patient's adherence to plan as established by provider.  Provided education to patient re: to process of obtaining appointment with pcp for evaluation, recommended the patient write down questions to ask the pcp. The patient has had her esophagus stretched x 2 in the past  Reviewed medications with patient and discussed compliance   Collaborated with office staff at [facility] regarding setting up and appointment for the patient to come in and have an evaluation for swallowing issues  Discussed plans with patient for ongoing care management follow up and provided patient with direct contact information for care management team  Provided patient with GERD educational materials related to swallowing difficulties   Reviewed scheduled/upcoming provider appointments including:   Patient Self Care Activities:   Patient verbalizes understanding of plan to work with  the CCM team to meet her health and wellness needs   Self administers medications as prescribed  Attends all scheduled provider appointments  Calls provider office for new concerns or questions  Unable to independently manage GERD signs and symptoms as evidence by difficulty swallowing   Initial goal documentation      RNCM: My blood pressure and cholesterol is better since I take medications       Current Barriers:   Chronic Disease Management support, education, and care coordination needs related to HTN and HLD  Clinical Goal(s) related to HTN and HLD:  Over the next 90 days, patient will:   Work with the care management team to address educational, disease management, and care coordination needs   Begin or continue self health monitoring activities as directed today Measure and record blood pressure 5 times per week  Call provider office for new or worsened signs and symptoms Blood pressure findings outside established parameters and New or worsened symptom related to HLD and other Chronic disease processes   Call care management team with questions or concerns  Verbalize basic understanding of patient centered plan of care established today  Interventions related to HTN and HLD:   Evaluation of current treatment plans and patient's adherence to plan as established by provider  Assessed patient understanding of disease states  Assessed patient's education and care coordination needs  Provided disease specific education to patient   Collaborated with appropriate clinical care team members regarding patient needs  Patient Self Care Activities related to HTN and HLD:   Patient is unable  to independently self-manage chronic health conditions  Initial goal documentation      RNCM: Nobody told me what to do about that shot (COVID19)       Current Barriers:   Knowledge Deficits related to COVID-19 and impact on patient self health management  Clinical Goal(s):    Over the next 30 days, patient will verbalize basic understanding of COVID-19 impact on individual health and self health management as evidenced by verbalization of basic understanding of COVID-19 as a viral disease, measures to prevent exposure, signs and symptoms, when to contact provider  Interventions:  Evaluation of current treatment plan related to [place] and patient's adherence to plan as established by provider.  Advised patient to discuss her questions and concerns about the COVID19 vaccine with her pcp  Provided patient with [place] educational materials related to vaccination and how to obtain  Patient Self Care Activities:   Patient verbalizes understanding of plan to discuss the COVID19 vaccine at new pcp visit   Self administers medications as prescribed  Attends all scheduled provider appointments  Calls provider office for new concerns or questions  Initial goal documentation                      Tracey Carroll was given information about Chronic Care Management services today including:  1. CCM service includes personalized support from designated clinical staff supervised by her physician, including individualized plan of care and coordination with other care providers 2. 24/7 contact phone numbers for assistance for urgent and routine care needs. 3. Service will only be billed when office clinical staff spend 20 minutes or more in a month to coordinate care. 4. Only one practitioner may furnish and bill the service in a calendar month. 5. The patient may stop CCM services at any time (effective at the end of the month) by phone call to the office staff. 6. The patient will be responsible for cost sharing (co-pay) of up to 20% of the service fee (after annual deductible is met).  Patient agreed to services and verbal consent obtained.   The patient verbalized understanding of instructions provided today and declined a print copy of patient  instruction materials.   Telephone follow up appointment with care management team member scheduled for: 08-15-2019 at 74 am  Noreene Larsson RN, MSN, [facility] Mobile: [phone] Dysphagia Eating Plan, Bite Size Food This diet plan is for people with moderate swallowing problems who have transitioned from pureed and minced foods. Bite size foods are soft and cut into small chunks so that they can be swallowed safely. On this eating plan, you may be instructed to drink liquids that are thickened. Work with your health care provider and your diet and nutrition specialist (dietitian) to make sure that you are following the diet safely and getting all the nutrients you need. What are tips for following this plan? General guidelines for foods   You may eat foods that are tender, soft, and moist.  Always test food texture before taking a bite. Poke food with a fork or spoon to make sure it is tender.  Food should be easy to cut and shew. Avoid large pieces of food that require a lot of chewing.  Take small bites. Each bite should be smaller than your thumb nail (about 21m by 15 mm).  If you were on pureed and minced food diet plans, you may eat any  of the foods included in those diets.  Avoid foods that are very dry, hard, sticky, chewy, coarse, or crunchy.  If instructed by your health care provider, thicken liquids. Follow your health care provider's instructions for what products to use, how to do this, and to what thickness. ? Your health care provider may recommend using a commercial thickener, rice cereal, or potato flakes. Ask your health care provider to recommend thickeners. ? Thickened liquids are usually a pudding-like consistency, or they may be as thick as honey or thick enough to eat with a spoon. Cooking  To moisten foods, you may add liquids while you are blending, mashing, or grinding your  foods to the right consistency. These liquids include gravies, sauces, vegetable or fruit juice, milk, half and half, or water.  Strain extra liquid from foods before eating.  Reheat foods slowly to prevent a tough crust from forming.  Prepare foods in advance. Meal planning  Eat a variety of foods to get all the nutrients you need.  Some foods may be tolerated better than others. Work with your dietitian to identify which foods are safest for you to eat.  Follow your meal plan as told by your dietitian. What foods are allowed? Grains Moist breads without nuts or seeds. Biscuits, muffins, pancakes, and waffles that are well-moistened with syrup, jelly, margarine, or butter. Cooked cereals. Moist bread stuffing. Moist rice. Well-moistened cold cereal with small chunks. Well-cooked pasta, noodles, rice, and bread dressing in small pieces and thick sauce. Soft dumplings or spaetzle in small pieces and butter or gravy. Vegetables Soft, well-cooked vegetables in small pieces. Soft-cooked, mashed potatoes. Thickened vegetable juice. Fruits Canned or cooked fruits that are soft or moist and do not have skin or seeds. Fresh, soft bananas. Thickened fruit juices. Meat and other protein foods Tender, moist meats or poultry in small pieces. Moist meatballs or meatloaf. Fish without bones. Eggs or egg substitutes in small pieces. Tofu. [place] and meat alternatives in small pieces. Well-cooked, tender beans, peas, baked beans, and other legumes. Dairy Thickened milk. Cream cheese. Yogurt. Cottage cheese. Sour cream. Small pieces of soft cheese. Fats and oils Butter. Oils. Margarine. Mayonnaise. Gravy. Spreads. Sweets and desserts Soft, smooth, moist desserts. Pudding. Custard. Moist cakes. Jam. Jelly. Honey. Preserves. Ask your health care provider whether you can have frozen desserts. Seasoning and other foods All seasonings and sweeteners. All sauces with small chunks. Prepared tuna, egg, or  chicken salad without raw fruits or vegetables. Moist casseroles with small, tender pieces of meat. Soups with tender meat. What foods are not allowed? Grains Coarse or dry cereals. Dry breads. Toast. Crackers. Tough, crusty breads, such as [place] bread and baguettes. Dry pancakes, waffles, and muffins. Sticky rice. Dry bread stuffing. Granola. Popcorn. Chips. Vegetables All raw vegetables. Cooked corn. Rubbery or stiff cooked vegetables. Stringy vegetables, such as celery. Tough, crisp fried potatoes. Potato skins. Fruits Hard, crunchy, stringy, high-pulp, and juicy raw fruits such as apples, pineapple, papaya, and watermelon. Small, round fruits, such as grapes. Dried fruit and fruit leather. Meat and other protein foods Large pieces of meat. Dry, tough meats, such as bacon, sausage, and hot dogs. Chicken, [place], or fish with skin and bones. Crunchy peanut butter. Nuts. Seeds. Nut and seed butters. Dairy Yogurt with nuts, seeds, or large chunks. Large chunks of cheese. Frozen desserts and milk consistency not allowed by your dietitian. Sweets and desserts Dry cakes. Chewy or dry cookies. Any desserts with nuts, seeds, dry fruits, coconut, pineapple, or anything dry,  sticky, or hard. Chewy caramel. Licorice. Taffy-type candies. Ask your health care provider whether you can have frozen desserts. Seasoning and other foods Soups with tough or large chunks of meats, poultry, or vegetables. Corn or clam chowder. Smoothies with large chunks of fruit. Summary  Bite size foods can be helpful for people with moderate swallowing problems.  On the dysphagia eating plan, you may eat foods that are soft, moist, and cut into pieces smaller than 19m by 163m  You may be instructed to thicken liquids. Follow your health care provider's instructions about how to do this and to what consistency. This information is not intended to replace advice given to you by your health care provider. Make sure you  discuss any questions you have with your health care provider. Document Revised: 08/23/2018 Document Reviewed: 08/12/2016 Elsevier Patient Education  [id number]

## 2019-07-16 ENCOUNTER — Encounter: Payer: Self-pay | Admitting: Family Medicine

## 2019-07-16 ENCOUNTER — Other Ambulatory Visit: Payer: Self-pay

## 2019-07-16 ENCOUNTER — Ambulatory Visit (INDEPENDENT_AMBULATORY_CARE_PROVIDER_SITE_OTHER): Payer: Medicare Other | Admitting: Family Medicine

## 2019-07-16 DIAGNOSIS — K219 Gastro-esophageal reflux disease without esophagitis: Secondary | ICD-10-CM

## 2019-07-16 DIAGNOSIS — E7800 Pure hypercholesterolemia, unspecified: Secondary | ICD-10-CM

## 2019-07-16 DIAGNOSIS — I10 Essential (primary) hypertension: Secondary | ICD-10-CM

## 2019-07-16 DIAGNOSIS — Z8719 Personal history of other diseases of the digestive system: Secondary | ICD-10-CM

## 2019-07-16 DIAGNOSIS — R1310 Dysphagia, unspecified: Secondary | ICD-10-CM

## 2019-07-16 MED ORDER — HYDROCHLOROTHIAZIDE 12.5 MG PO TABS: 12.5000 mg | ORAL_TABLET | Freq: Every day | ORAL | 1 refills | Status: DC

## 2019-07-16 MED ORDER — OMEPRAZOLE 40 MG PO CPDR: 40.0000 mg | DELAYED_RELEASE_CAPSULE | Freq: Two times a day (BID) | ORAL | 1 refills | Status: DC

## 2019-07-16 MED ORDER — LOSARTAN POTASSIUM 50 MG PO TABS: 50.0000 mg | ORAL_TABLET | Freq: Every day | ORAL | 1 refills | Status: DC

## 2019-07-16 MED ORDER — ATORVASTATIN CALCIUM 20 MG PO TABS: ORAL_TABLET | ORAL | 1 refills | Status: DC

## 2019-07-16 NOTE — Assessment & Plan Note (Signed)
Patient having continued intermittent swallowing issues.  Referral placed for speech therapy evaluation and to follow up with Gastroenterology for possible repeat EGD.  To continue Omeprazole 40mg  2x a day for GERD treatment.

## 2019-07-16 NOTE — Patient Instructions (Signed)
As we discussed, I will reorder the speech therapy referral from October 2020 and will put in a referral to gastroenterology for further evaluation, as your last visit with them was in May 2009.  If you do not hear anything in the next 2 weeks, give our office a call and we will assist you with this.  I put in an order for lab work to be done in the next few weeks and we will call with the results.  I have sent in refills on your medications to the [facility] on file.  Try to get exercise a minimum of 30 minutes per day at least 5 days per week as well as  adequate water intake all while measuring blood pressure a few times per week.  Keep a blood pressure log and bring back to clinic at your next visit.  If your readings are consistently over 140/90 to contact our office/send me a MyChart message and we will see you sooner.  If you begin to have any low pressure readings to call us as well.  Can try DASH and Mediterranean diet options, avoiding processed foods, lowering sodium intake, avoiding pork products, and eating a plant based diet for optimal health.  We will plan to see you back in 3 months for re-evaluation of your dysphagia, hypertension and hyperlipidemia.  You will receive a survey after today's visit either digitally by e-mail or paper by [employer]. Your experiences and feedback matter to [place].  Please respond so we know how we are doing as we provide care for you.  Call us with any questions/concerns/needs.  It is my goal to be available to you for your health concerns.  Thanks for choosing me to be a partner in your healthcare needs!  Harlin Rain, FNP-C Family Nurse Practitioner [facility] Phone: [phone]

## 2019-07-16 NOTE — Progress Notes (Signed)
Subjective:    Patient ID: Tracey Carroll, female    DOB: [birth year], 79 y.o.   MRN: [id number]  Tracey Carroll is a 79 y.o. female presenting on 07/16/2019 for Choking (difficulty with swallowing  1 yr. With history of having to get her esphagus stretched)   HPI  [name] presents to clinic for concerns over dysphagia x 1 year.  Has been having intermittent episodes of difficulty swallowing and increased GERD.  Has been taking her Omeprazole as directed with some relief in her symptoms.  Has a history of strictures, last EGD was completed 09/20/2007 with Dr. Saunders Revel with [facility].  Had speech therapy ordered in 02/2019 but has not yet scheduled an evaluation with them.  Depression screen [facility] 2/9 04/17/2019 09/12/2018 03/12/2018  Decreased Interest 0 0 0  Down, Depressed, Hopeless 0 0 0  PHQ - 2 Score 0 0 0    Social History   Tobacco Use  . Smoking status: Never Smoker  . Smokeless tobacco: Never Used  Substance Use Topics  . Alcohol use: No    Alcohol/week: 0.0 standard drinks  . Drug use: No    Review of Systems  Constitutional: Negative.   HENT: Positive for trouble swallowing. Negative for dental problem, facial swelling, mouth sores, postnasal drip, rhinorrhea and sore throat.   Eyes: Negative.   Respiratory: Negative.   Cardiovascular: Positive for leg swelling. Negative for chest pain and palpitations.  Gastrointestinal: Negative.   Endocrine: Negative.   Genitourinary: Negative.   Musculoskeletal: Negative.   Skin: Negative.   Allergic/Immunologic: Negative.   Neurological: Negative.   Hematological: Negative.   Psychiatric/Behavioral: Negative.    Per HPI unless specifically indicated above     Objective:    BP 133/70 (BP Location: Right Arm, Patient Position: Sitting, Cuff Size: Normal)   Pulse 76   Temp (!) 97.1 F (36.2 C) (Oral)   Ht 4\' 9"  (1.448 m)   Wt 193 lb 9.6 oz (87.8 kg)   BMI 41.89 kg/m   Wt Readings from Last 3 Encounters:    07/16/19 193 lb 9.6 oz (87.8 kg)  04/26/19 180 lb 6.4 oz (81.8 kg)  04/10/19 178 lb (80.7 kg)    Physical Exam Vitals reviewed.  Constitutional:      General: She is not in acute distress.    Appearance: Normal appearance. She is well-groomed. She is obese. She is not ill-appearing or toxic-appearing.  HENT:     Head: Normocephalic.     Nose: Nose normal.     Mouth/Throat:     Mouth: Mucous membranes are moist.     Pharynx: Oropharynx is clear. No oropharyngeal exudate or posterior oropharyngeal erythema.  Eyes:     General: Lids are normal. Vision grossly intact.        Right eye: No discharge.        Left eye: No discharge.     Extraocular Movements: Extraocular movements intact.     Conjunctiva/sclera: Conjunctivae normal.     Pupils: Pupils are equal, round, and reactive to light.  Cardiovascular:     Rate and Rhythm: Normal rate and regular rhythm.     Pulses: Normal pulses.          Dorsalis pedis pulses are 2+ on the right side and 2+ on the left side.       Posterior tibial pulses are 2+ on the right side and 2+ on the left side.     Heart sounds: Normal  heart sounds. No murmur. No friction rub. No gallop.   Pulmonary:     Effort: Pulmonary effort is normal. No respiratory distress.     Breath sounds: Normal breath sounds.  Abdominal:     General: Bowel sounds are normal.     Palpations: Abdomen is soft. There is no mass.     Tenderness: There is no abdominal tenderness. There is no guarding.  Musculoskeletal:        General: Normal range of motion.     Cervical back: Normal range of motion.     Right lower leg: 1+ Edema present.     Left lower leg: 1+ Edema present.  Feet:     Right foot:     Skin integrity: Skin integrity normal.     Left foot:     Skin integrity: Skin integrity normal.  Skin:    General: Skin is warm and dry.     Capillary Refill: Capillary refill takes less than 2 seconds.  Neurological:     General: No focal deficit present.     Mental  Status: She is alert and oriented to person, place, and time.     Cranial Nerves: Cranial nerves are intact.     Sensory: Sensation is intact.     Motor: Motor function is intact.     Coordination: Coordination is intact.     Gait: Gait is intact.  Psychiatric:        Attention and Perception: Attention and perception normal.        Mood and Affect: Mood and affect normal.        Speech: Speech normal.        Behavior: Behavior normal. Behavior is cooperative.        Thought Content: Thought content normal.        Cognition and Memory: Cognition and memory normal.        Judgment: Judgment normal.     Results for orders placed or performed in visit on 04/10/19  Microscopic Examination   URINE  Result Value Ref Range   WBC, UA None seen 0 - 5 /hpf   RBC 0-2 0 - 2 /hpf   Epithelial Cells (non renal) None seen 0 - 10 /hpf   Bacteria, UA Few None seen/Few  Urinalysis, Complete  Result Value Ref Range   Specific Gravity, UA 1.025 1.005 - 1.030   pH, UA 5.5 5.0 - 7.5   Color, UA Orange Yellow   Appearance Ur Cloudy (A) Clear   Leukocytes,UA Negative Negative   Protein,UA Trace (A) Negative/Trace   Glucose, UA Negative Negative   Ketones, UA 1+ (A) Negative   RBC, UA Trace (A) Negative   Bilirubin, UA Positive (A) Negative   Urobilinogen, Ur 1.0 0.2 - 1.0 mg/dL   Nitrite, UA Negative Negative   Microscopic Examination See below:       Assessment & Plan:   Problem List Items Addressed This Visit      Cardiovascular and Mediastinum   Essential hypertension    Stable and well controlled on current medication regimen.  Currently taking hydrocholorthiazide 12.5mg  daily and losartan 50mg  daily and tolerating it well without side effects.  Reports home blood pressure readings are normal  Plan: 1) Labs ordered today and to be completed in the next 1-2 weeks 2) Continue hydrochlorothiazide 12.5mg  daily and losartan 50mg  daily.  3) Take blood pressure readings regularly and write  in a log.  Bring that log to your next appointment. 4)  Heart healthy diet and to exercise every other day for 30 minutes per day, going no more than 2 days in a row without exercise. 5) We will see you back in 6 months      Relevant Medications   atorvastatin (LIPITOR) 20 MG tablet   hydrochlorothiazide (HYDRODIURIL) 12.5 MG tablet   losartan (COZAAR) 50 MG tablet   Other Relevant Orders   CBC with Differential   COMPLETE METABOLIC PANEL WITH GFR   Lipid Profile     Digestive   Gastroesophageal reflux disease   Relevant Medications   omeprazole (PRILOSEC) 40 MG capsule   Dysphagia    Patient having continued intermittent swallowing issues.  Referral placed for speech therapy evaluation and to follow up with Gastroenterology for possible repeat EGD.  To continue Omeprazole 40mg  2x a day for GERD treatment.      Relevant Orders   Ambulatory referral to Gastroenterology   Ambulatory referral to Speech Therapy     Other   Pure hypercholesterolemia    Unknown status, labs ordered today to assess.  Currently taking atorvastatin 20mg  daily and tolerating it well.   Plan: 1) Labs ordered today and to be completed in the next 1-2 weeks 2) Continue atorvastatin 20mg  daily  3) Heart healthy diet and to exercise every other day for 30 minutes per day, going no more than 2 days in a row without exercise. 4) We will see you back in 6 months      Relevant Medications   atorvastatin (LIPITOR) 20 MG tablet   hydrochlorothiazide (HYDRODIURIL) 12.5 MG tablet   losartan (COZAAR) 50 MG tablet   Other Relevant Orders   CBC with Differential   COMPLETE METABOLIC PANEL WITH GFR   Lipid Profile    Other Visit Diagnoses    Obesity, morbid, BMI 40.0-49.9 ([place])    -  Primary   Relevant Orders   Thyroid Panel With TSH   History of Barrett's esophagus       Relevant Orders   Ambulatory referral to Gastroenterology      Meds ordered this encounter  Medications  . atorvastatin (LIPITOR) 20  MG tablet    Sig: TAKE 1 TABLET BY MOUTH ONCE DAILY. APPOINTMENT NEEDED FOR FURTHER REFILLS.    Dispense:  90 tablet    Refill:  1  . hydrochlorothiazide (HYDRODIURIL) 12.5 MG tablet    Sig: Take 1 tablet (12.5 mg total) by mouth daily.    Dispense:  90 tablet    Refill:  1  . losartan (COZAAR) 50 MG tablet    Sig: Take 1 tablet (50 mg total) by mouth daily.    Dispense:  90 tablet    Refill:  1  . omeprazole (PRILOSEC) 40 MG capsule    Sig: Take 1 capsule (40 mg total) by mouth 2 (two) times daily.    Dispense:  180 capsule    Refill:  1      Follow up plan: Return in about 3 months (around 10/16/2019) for F/U GERD, Dysphagia, HTN, Hyperlipidemia.   Harlin Rain, [place] Family Nurse Practitioner [facility] 07/16/2019, 1:44 PM

## 2019-07-16 NOTE — Assessment & Plan Note (Signed)
Stable and well controlled on current medication regimen.  Currently taking hydrocholorthiazide 12.5mg  daily and losartan 50mg  daily and tolerating it well without side effects.  Reports home blood pressure readings are normal  Plan: 1) Labs ordered today and to be completed in the next 1-2 weeks 2) Continue hydrochlorothiazide 12.5mg  daily and losartan 50mg  daily.  3) Take blood pressure readings regularly and write in a log.  Bring that log to your next appointment. 4) Heart healthy diet and to exercise every other day for 30 minutes per day, going no more than 2 days in a row without exercise. 5) We will see you back in 6 months

## 2019-07-16 NOTE — Assessment & Plan Note (Signed)
Unknown status, labs ordered today to assess.  Currently taking atorvastatin 20mg  daily and tolerating it well.   Plan: 1) Labs ordered today and to be completed in the next 1-2 weeks 2) Continue atorvastatin 20mg  daily  3) Heart healthy diet and to exercise every other day for 30 minutes per day, going no more than 2 days in a row without exercise. 4) We will see you back in 6 months

## 2019-07-18 ENCOUNTER — Encounter: Payer: Self-pay | Admitting: *Deleted

## 2019-07-30 ENCOUNTER — Encounter: Payer: Self-pay | Admitting: Gastroenterology

## 2019-07-31 ENCOUNTER — Encounter: Payer: Self-pay | Admitting: Gastroenterology

## 2019-07-31 ENCOUNTER — Ambulatory Visit (INDEPENDENT_AMBULATORY_CARE_PROVIDER_SITE_OTHER): Payer: Medicare Other | Admitting: Gastroenterology

## 2019-07-31 ENCOUNTER — Other Ambulatory Visit: Payer: Self-pay | Admitting: Family Medicine

## 2019-07-31 ENCOUNTER — Other Ambulatory Visit: Payer: Self-pay

## 2019-07-31 ENCOUNTER — Telehealth: Payer: Self-pay

## 2019-07-31 DIAGNOSIS — R1310 Dysphagia, unspecified: Secondary | ICD-10-CM

## 2019-07-31 DIAGNOSIS — R1319 Other dysphagia: Secondary | ICD-10-CM

## 2019-07-31 NOTE — Progress Notes (Signed)
r 

## 2019-07-31 NOTE — Patient Instructions (Signed)
Dysphagia Eating Plan, Minced and Moist Foods This eating plan is for people with moderate swallowing problems who are transitioning from pureed to solid foods. Moist and minced foods are soft and cut into very small chunks so that they can be swallowed safely. On this eating plan, you may be instructed to drink liquids that are thickened. Work with your health care provider and your diet and nutrition specialist (dietitian) to make sure that you are following the diet safely and getting all the nutrients you need. What are tips for following this plan? General guidelines for foods   You may eat foods that are soft and moist.  Always test food texture before taking a bite. Poke food with a fork or spoon to make sure it is tender.  Take small bites. Each bite should be smaller than your little finger nail (about 4 mm by 4 mm).  If you were on a pureed food eating plan, you may still eat any of the foods included in that diet.  Avoid foods that are dry, hard, sticky, chewy, coarse, or crunchy.  Avoid foods that separate into thin liquids and solids, such as cereal with milk or chunky soups.  Avoid liquids that have seeds or chunks.  If instructed by your health care provider, thicken liquids. Follow your health care provider's instructions about what products to use, how to do this, and to what thickness. ? You may use a commercial thickener, rice cereal, or potato flakes. ? Thickened liquids are usually a "pudding-like" consistency, or they may be as thick as honey or thick enough to eat with a spoon. Cooking  You may need to use a blender, whisk, or masher to soften some of your foods.  To moisten foods, you may add liquids while you are blending, mashing, or grinding your foods to the right consistency. These liquids include gravies, sauces, vegetable or fruit juice, milk, half and half, or water.  Reheat foods slowly to prevent a tough crust from forming. Meal planning  Eat a  variety of foods in order to get all the nutrients you need.  Follow your meal plan as told by your health care provider or dietitian. What foods are allowed? Grains Soaked soft breads without nuts or seeds. Pancakes, sweet rolls, pastries, and French toast that have been moistened with syrup or sauce. Well-cooked pasta, noodles, rice, and bread dressing in very small pieces and thick sauce. Soft dumplings or spaetzle in very small pieces and butter or gravy. Soft-cooked cereals. Vegetables Very soft, well-cooked vegetables in very small pieces. Soft-cooked, mashed potatoes. Thickened vegetable juice. Fruits Canned or cooked fruits that are soft or moist and do not have skin or seeds. Fresh, soft bananas. Thickened fruit juices. Meat and other protein foods Tender, moist, and finely minced or ground meats or poultry. Moist meatballs or meatloaf. Fish without bones. Scrambled, poached, or soft-cooked eggs. Tofu. Tempeh and meat alternatives in very small pieces. Well-cooked, moistened and mashed beans, baked beans, peas, and other legumes. Dairy Thickened milk. Cream cheese. Yogurt. Cottage cheese. Sour cream. Fats and oils Butter. Margarine. Cream for cereal, depending on liquid consistency allowed. Gravy. Cream sauces. Mayonnaise. Sweets and desserts Pudding. Custard. Ice cream and sherbet. Whipped toppings. Soft, moist cakes. Icing. Jelly. Jams and preserves without seeds. Seasoning and other foods Sauces and salsas that have soft chunks that are smaller than 4mm. Salad dressings. Casseroles with small pieces of tender meat. All seasonings and sweeteners. Beverages Anything prepared at the thickness recommended by your   dietitian. What foods are not allowed? Grains Breads that are hard or have nuts or seeds. Dry biscuits, pancakes, waffles, and bread dressing. Coarse cereals. Cereals that have nuts, seeds, dried fruits, or coconut. Sticky rice. Large pieces of pasta. Vegetables All raw  vegetables. Tough, fibrous, chewy, or stringy cooked vegetables, such as celery, peas, broccoli, cabbage, Brussels sprouts, and asparagus. Potato skins. Potato and other vegetable chips. Fried or French-fried potatoes. Cooked corn and peas. Fruits Hard, crunchy, stringy, high-pulp, and juicy raw fruits such as apples, pineapple, papaya, and watermelon. Fruits with skins and seeds, such as grapes. Dried fruit and fruit leather. Meats and other protein foods Large pieces of meat. Dry, tough meats, such as bacon, sausage, and hot dogs. Chicken, turkey, or fish with skin and bones. Crunchy peanut butter. Nuts. Seeds. Dairy Yogurt with nuts, seeds, or large chunks. Large chunks of cheese. Frozen desserts and milk consistency not allowed by your dietitian. Sweets and desserts Coarse, hard, chewy, or sticky desserts. Any dessert with nuts, seeds, coconut, pineapple, or dried fruit. Bread pudding. Seasoning and other foods Soups and casseroles with large chunks. Sandwiches. Pizza. Summary  Moist and minced foods can be helpful for people with moderate swallowing problems.  On the dysphagia eating plan, you may eat foods that are soft, moist, and cut into pieces smaller than 4mm by 4mm.  You may be instructed to thicken liquids. Follow your health care provider's instructions about how to do this and to what consistency. This information is not intended to replace advice given to you by your health care provider. Make sure you discuss any questions you have with your health care provider. Document Revised: 08/23/2018 Document Reviewed: 08/12/2016 Elsevier Patient Education  2020 Elsevier Inc.  

## 2019-07-31 NOTE — Telephone Encounter (Signed)
Called to try to scheduled DG OP Swallowing Func-Medic[employer]/[facility]   For patient. Special procedures said the order was to old to use and they would need a new order. Reach out to Dr. Bonna Gains she said to contact PCP office to get them to reorder the test. Sent staff message to PCP office asking this request. Informed patient of this information.

## 2019-08-01 NOTE — Progress Notes (Signed)
Tracey Carroll [address]  [place], [place] [address]  Main: [phone]  Fax: [phone]   Gastroenterology Consultation  Referring Provider:     Verl Bangs, FNP Primary Care Physician:  Tracey Bangs, FNP Reason for Consultation:     Dysphagia        HPI:   Virtual Visit via Telephone Note  I connected with patient on 08/01/19 at 10:30 AM EDT by telephone and verified that I am speaking with the correct person using two identifiers.   I discussed the limitations, risks, security and privacy concerns of performing an evaluation and management service by telephone and the availability of in person appointments. I also discussed with the patient that there may be Carroll patient responsible charge related to this service. The patient expressed understanding and agreed to proceed.  Location of the patient: Home Location of provider: Home Participating persons: Patient and provider only   History of Present Illness: Chief Complaint  Patient presents with  . New Patient (Initial Visit)  . Dysphagia    Patient states this is with food. Patient has some burning in throat and acid reflex      Tracey Carroll is Carroll 79 y.o. y/o female referred for consultation & management  by Dr. Lorine Carroll, Tracey Raider, FNP.  Patient reports intermittent dysphagia to solid foods only.  No dysphagia to liquids.  No episodes of food impaction.  Has had previous upper endoscopy for the same in 2016 with small hiatal hernia and benign intrinsic stenosis reported.  Dilated to 79 [place] with [facility] dilator.  Patient states this helped her symptoms at that time and they did not recur until the last 1 to 2 months.  No weight loss.  No nausea or vomiting.  Past Medical History:  Diagnosis Date  . Acid reflux 10/19/2014  . Arthritis    arms  . Back pain, chronic 10/19/2014  . Dysphagia 10/20/2014  . Elevated BP 10/20/2014  . Fibroids, intramural 10/19/2014  . FOM (frequency of micturition)  10/19/2014  . GERD (gastroesophageal reflux disease)   . Hyperlipidemia   . 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 LBP (low back pain) 10/19/2014    Past Surgical History:  Procedure Laterality Date  . ESOPHAGOGASTRODUODENOSCOPY (EGD) WITH PROPOFOL N/Carroll 11/20/2014   Procedure: ESOPHAGOGASTRODUODENOSCOPY (EGD) WITH PROPOFOL with dilation;  Surgeon: Tracey Lame, MD;  Location: [facility];  Service: Endoscopy;  Laterality: N/Carroll;  . TUBAL LIGATION      Prior to Admission medications   Medication Sig Start Date End Date Taking? Authorizing Provider  atorvastatin (LIPITOR) 20 MG tablet TAKE 1 TABLET BY MOUTH ONCE DAILY. APPOINTMENT NEEDED FOR FURTHER REFILLS. 07/16/19  Yes Tracey Carroll, Tracey Raider, FNP  conjugated estrogens (PREMARIN) vaginal cream Apply 0.5mg  (pea-sized amount)  just inside the vaginal introitus with Carroll finger-tip on  Monday, Wednesday and Friday nights. 04/26/19  Yes Tracey Carroll, Tracey Carroll A, PA-C  hydrochlorothiazide (HYDRODIURIL) 12.5 MG tablet Take 1 tablet (12.5 mg total) by mouth daily. 07/16/19  Yes Tracey Carroll, Tracey Raider, FNP  losartan (COZAAR) 50 MG tablet Take 1 tablet (50 mg total) by mouth daily. 07/16/19  Yes Tracey Carroll, Tracey Raider, FNP  omeprazole (PRILOSEC) 40 MG capsule Take 1 capsule (40 mg total) by mouth 2 (two) times daily. 07/16/19  Yes Tracey Carroll, Tracey Raider, FNP    Family History  Problem Relation Age of Onset  . Diabetes Father        in her 10 and 35s  . Hypertension  Daughter        her 35s  . Kidney cancer Neg Hx   . Bladder Cancer Neg Hx      Social History   Tobacco Use  . Smoking status: Never Smoker  . Smokeless tobacco: Never Used  Substance Use Topics  . Alcohol use: No    Alcohol/week: 0.0 standard drinks  . Drug use: No    Allergies as of 07/31/2019  . (No Known Allergies)    Review of Systems:    All systems reviewed and negative except where noted in HPI.   Observations/Objective:  Labs: CBC    Component Value Date/Time   WBC 4.8 03/05/2019 0947   RBC 3.77 (L)  03/05/2019 0947   HGB 12.2 03/05/2019 0947   HCT 35.7 03/05/2019 0947   PLT 230 03/05/2019 0947   MCV 94.7 03/05/2019 0947   MCH 32.4 03/05/2019 0947   MCHC 34.2 03/05/2019 0947   RDW 12.4 03/05/2019 0947   LYMPHSABS 1,632 03/05/2019 0947   EOSABS 29 03/05/2019 0947   BASOSABS 19 03/05/2019 0947   CMP     Component Value Date/Time   NA 140 03/05/2019 0947   K 4.3 03/05/2019 0947   CL 103 03/05/2019 0947   CO2 28 03/05/2019 0947   GLUCOSE 94 03/05/2019 0947   BUN 9 03/05/2019 0947   BUN 14 01/04/2017 1622   CREATININE 0.69 03/05/2019 0947   CALCIUM 9.6 03/05/2019 0947   PROT 7.2 03/05/2019 0947   ALBUMIN 4.0 12/09/2016 0900   AST 23 03/05/2019 0947   ALT 9 03/05/2019 0947   ALKPHOS 63 12/09/2016 0900   BILITOT 0.7 03/05/2019 0947   GFRNONAA 86 03/12/2018 0903   GFRAA 99 03/12/2018 0903    Imaging Studies: No results found.  Assessment and Plan:   Tracey Carroll is Carroll 79 y.o. y/o female has been referred for dysphagia with previous history of dilation of benign-appearing intrinsic stenosis in 2016 with good results  Assessment and Plan: EGD indicated due to dysphagia  I have discussed alternative options, risks & benefits,  which include, but are not limited to, bleeding, infection, perforation,respiratory complication & drug reaction.  The patient agrees with this plan & written consent will be obtained.    Previous colonoscopy in 2010 with diverticulosis and internal hemorrhoids reported.  No family history of colon cancer.  Patient is about 79 and screening colonoscopy is not indicated at this age.  Risks and benefits of procedure discussed in detail patient is not interested in colonoscopy at this time for screening.  Follow Up Instructions:   I discussed the assessment and treatment plan with the patient. The patient was provided an opportunity to ask questions and all were answered. The patient agreed with the plan and demonstrated an understanding of the  instructions.   The patient was advised to call back or seek an in-person evaluation if the symptoms worsen or if the condition fails to improve as anticipated.  I provided 13 minutes of non-face-to-face time during this encounter.   Tracey Manifold, MD  Speech recognition software was used to dictate the above note.

## 2019-08-07 ENCOUNTER — Ambulatory Visit: Payer: Medicare Other | Attending: Family Medicine

## 2019-08-15 ENCOUNTER — Telehealth: Payer: Self-pay | Admitting: General Practice

## 2019-08-15 ENCOUNTER — Ambulatory Visit (INDEPENDENT_AMBULATORY_CARE_PROVIDER_SITE_OTHER): Payer: Medicare Other | Admitting: General Practice

## 2019-08-15 DIAGNOSIS — K219 Gastro-esophageal reflux disease without esophagitis: Secondary | ICD-10-CM

## 2019-08-15 DIAGNOSIS — R1319 Other dysphagia: Secondary | ICD-10-CM

## 2019-08-15 DIAGNOSIS — R1310 Dysphagia, unspecified: Secondary | ICD-10-CM

## 2019-08-15 DIAGNOSIS — E782 Mixed hyperlipidemia: Secondary | ICD-10-CM

## 2019-08-15 DIAGNOSIS — I10 Essential (primary) hypertension: Secondary | ICD-10-CM

## 2019-08-15 NOTE — Chronic Care Management (AMB) (Signed)
Chronic Care Management   Follow Up Note   08/15/2019 Name: Tracey Carroll MRN: [id number] DOB: Aug 19, 1940  Referred by: Verl Bangs, FNP Reason for referral : Chronic Care Management (/HLD/Swallowing difficulties/eye exam)   Tracey Carroll is a 79 y.o. year old female who is a primary care patient of Verl Bangs, FNP. The CCM team was consulted for assistance with chronic disease management and care coordination needs.    Review of patient status, including review of consultants reports, relevant laboratory and other test results, and collaboration with appropriate care team members and the patient's provider was performed as part of comprehensive patient evaluation and provision of chronic care management services.    SDOH (Social Determinants of Health) assessments performed: No See Care Plan activities for detailed interventions related to [facility])     Outpatient Encounter Medications as of 08/15/2019  Medication Sig   atorvastatin (LIPITOR) 20 MG tablet TAKE 1 TABLET BY MOUTH ONCE DAILY. APPOINTMENT NEEDED FOR FURTHER REFILLS.   conjugated estrogens (PREMARIN) vaginal cream Apply 0.5mg  (pea-sized amount)  just inside the vaginal introitus with a finger-tip on  Monday, Wednesday and Friday nights.   hydrochlorothiazide (HYDRODIURIL) 12.5 MG tablet Take 1 tablet (12.5 mg total) by mouth daily.   losartan (COZAAR) 50 MG tablet Take 1 tablet (50 mg total) by mouth daily.   omeprazole (PRILOSEC) 40 MG capsule Take 1 capsule (40 mg total) by mouth 2 (two) times daily.   No facility-administered encounter medications on file as of 08/15/2019.     Objective:  BP Readings from Last 3 Encounters:  07/16/19 133/70  04/26/19 126/75  04/10/19 132/84    Goals Addressed            This Visit's Progress    RNCM: I am have difficulty swallowing       Current Barriers:   Knowledge Deficits related to swallowing difficulties and how to obtain care   Nurse Case Manager  Clinical Goal(s):   Over the next 90 days, patient will verbalize understanding of plan for seeing the new NP at [facility] for an appointment to evaluate and recommend treatment for issues the patient is having with swallowing- completed- the patient sees a specialist on May 4th  Over the next 90 days, patient will work with [facility] and pcp to address needs related to swallowing problems and referrals as needed to evaluate and treat swallowing issues   Over the next 30 to 60 days, patient will attend all scheduled medical appointments: staff to call and make an appointment with the pcp to be seen- completed   Over the next 90 days, the patient will demonstrate ongoing self health care management ability as evidenced by improved swallowing and no problems related to ineffective airway clearance   Interventions:   Evaluation of current treatment plan related to GERD and patient's adherence to plan as established by provider.  Provided education to patient re: to process of obtaining appointment with pcp for evaluation, recommended the patient write down questions to ask the pcp. The patient has had her esophagus stretched x 2 in the past- completed. The patient has an upcoming appointment with a specialist for upcoming evaluation and treatment.  Reviewed medications with patient and discussed compliance   Collaborated with office staff at [facility] regarding setting up and appointment for the patient to come in and have an evaluation for swallowing issues. Done   Discussed plans with patient for ongoing care management follow up and provided patient with direct contact  information for care management team  Provided patient with GERD educational materials related to swallowing difficulties   Reviewed scheduled/upcoming provider appointments including: Patient has an appointment with the specialist on May 12th for evaluation and further treatment options.   Patient Self Care Activities:   Patient  verbalizes understanding of plan to work with the CCM team to meet her health and wellness needs   Self administers medications as prescribed  Attends all scheduled provider appointments  Calls provider office for new concerns or questions  Unable to independently manage GERD signs and symptoms as evidence by difficulty swallowing   Please see past updates related to this goal by clicking on the "Past Updates" button in the selected goal       RNCM: My blood pressure and cholesterol is better since I take medications       Current Barriers:   Chronic Disease Management support, education, and care coordination needs related to HTN and HLD  Clinical Goal(s) related to HTN and HLD:  Over the next 90 days, patient will:   Work with the care management team to address educational, disease management, and care coordination needs   Begin or continue self health monitoring activities as directed today Measure and record blood pressure 5 times per week  Call provider office for new or worsened signs and symptoms Blood pressure findings outside established parameters and New or worsened symptom related to HLD and other Chronic disease processes   Call care management team with questions or concerns  Verbalize basic understanding of patient centered plan of care established today  Interventions related to HTN and HLD:   Evaluation of current treatment plans and patient's adherence to plan as established by provider  Assessed patient understanding of disease states: Review of Chronic condtions and the patient states she feels she is doing well at this time. Has several upcoming appointments   Assessed patient's education and care coordination needs: The patient states that she is still watching her diet.  Still has the swallowing issus but they are not worse. Will see specialist soon. Confirmed the patient has an eye exam on May 4th.  Provided disease specific education to patient:  Education on checking blood pressure, adhering to heart Healthy diet and calling provider for changes in condition.   Collaborated with appropriate clinical care team members regarding patient needs  Patient Self Care Activities related to HTN and HLD:   Patient is unable to independently self-manage chronic health conditions  Please see past updates related to this goal by clicking on the "Past Updates" button in the selected goal          Plan:   The care management team will reach out to the patient again over the next 60 days.    Noreene Larsson RN, MSN, [place] Mobile: [phone]

## 2019-08-15 NOTE — Progress Notes (Signed)
Thanks so much. 

## 2019-08-15 NOTE — Patient Instructions (Signed)
Visit Information  Goals Addressed            This Visit's Progress   . RNCM: I am have difficulty swallowing       Current Barriers:  Marland Kitchen Knowledge Deficits related to swallowing difficulties and how to obtain care   Nurse Case Manager Clinical Goal(s):  Marland Kitchen Over the next 90 days, patient will verbalize understanding of plan for seeing the new NP at [facility] for an appointment to evaluate and recommend treatment for issues the patient is having with swallowing- completed- the patient sees a specialist on May 4th . Over the next 90 days, patient will work with [facility] and pcp to address needs related to swallowing problems and referrals as needed to evaluate and treat swallowing issues  . Over the next 30 to 60 days, patient will attend all scheduled medical appointments: staff to call and make an appointment with the pcp to be seen- completed  . Over the next 90 days, the patient will demonstrate ongoing self health care management ability as evidenced by improved swallowing and no problems related to ineffective airway clearance   Interventions:  . Evaluation of current treatment plan related to GERD and patient's adherence to plan as established by provider. . Provided education to patient re: to process of obtaining appointment with pcp for evaluation, recommended the patient write down questions to ask the pcp. The patient has had her esophagus stretched x 2 in the past- completed. The patient has an upcoming appointment with a specialist for upcoming evaluation and treatment. . Reviewed medications with patient and discussed compliance  . Collaborated with office staff at [facility] regarding setting up and appointment for the patient to come in and have an evaluation for swallowing issues. Done  . Discussed plans with patient for ongoing care management follow up and provided patient with direct contact information for care management team . Provided patient with GERD educational materials related to  swallowing difficulties  . Reviewed scheduled/upcoming provider appointments including: Patient has an appointment with the specialist on May 12th for evaluation and further treatment options.   Patient Self Care Activities:  . Patient verbalizes understanding of plan to work with the CCM team to meet her health and wellness needs  . Self administers medications as prescribed . Attends all scheduled provider appointments . Calls provider office for new concerns or questions . Unable to independently manage GERD signs and symptoms as evidence by difficulty swallowing   Please see past updates related to this goal by clicking on the "Past Updates" button in the selected goal      . RNCM: My blood pressure and cholesterol is better since I take medications       Current Barriers:  . Chronic Disease Management support, education, and care coordination needs related to HTN and HLD  Clinical Goal(s) related to HTN and HLD:  Over the next 90 days, patient will:  . Work with the care management team to address educational, disease management, and care coordination needs  . Begin or continue self health monitoring activities as directed today Measure and record blood pressure 5 times per week . Call provider office for new or worsened signs and symptoms Blood pressure findings outside established parameters and New or worsened symptom related to HLD and other Chronic disease processes  . Call care management team with questions or concerns . Verbalize basic understanding of patient centered plan of care established today  Interventions related to HTN and HLD:  . Evaluation of  current treatment plans and patient's adherence to plan as established by provider . Assessed patient understanding of disease states: Review of Chronic condtions and the patient states she feels she is doing well at this time. Has several upcoming appointments  . Assessed patient's education and care coordination needs: The  patient states that she is still watching her diet.  Still has the swallowing issus but they are not worse. Will see specialist soon. Confirmed the patient has an eye exam on May 4th. . Provided disease specific education to patient: Education on checking blood pressure, adhering to heart Healthy diet and calling provider for changes in condition.  Nash Dimmer with appropriate clinical care team members regarding patient needs  Patient Self Care Activities related to HTN and HLD:  . Patient is unable to independently self-manage chronic health conditions  Please see past updates related to this goal by clicking on the "Past Updates" button in the selected goal         Patient verbalizes understanding of instructions provided today.   The care management team will reach out to the patient again over the next 60 days.   Noreene Larsson RN, MSN, [place] Mobile: [phone]

## 2019-08-26 ENCOUNTER — Telehealth: Payer: Self-pay | Admitting: Family Medicine

## 2019-08-26 NOTE — Telephone Encounter (Signed)
Pt. Called requesting refill on omeprazole

## 2019-08-31 LAB — THYROID PANEL WITH TSH
Free Thyroxine Index: 2.3 (ref 1.4–3.8)
T3 Uptake: 28 % (ref 22–35)
T4, Total: 8.2 ug/dL (ref 5.1–11.9)
TSH: 2.05 mIU/L (ref 0.40–4.50)

## 2019-08-31 LAB — COMPLETE METABOLIC PANEL WITH GFR
AG Ratio: 1.5 (calc) (ref 1.0–2.5)
ALT: 10 U/L (ref 6–29)
AST: 19 U/L (ref 10–35)
Albumin: 4.3 g/dL (ref 3.6–5.1)
Alkaline phosphatase (APISO): 52 U/L (ref 37–153)
BUN: 8 mg/dL (ref 7–25)
CO2: 28 mmol/L (ref 20–32)
Calcium: 9.5 mg/dL (ref 8.6–10.4)
Chloride: 100 mmol/L (ref 98–110)
Creat: 0.72 mg/dL (ref 0.60–0.93)
GFR, Est African American: 94 mL/min/{1.73_m2} (ref >=60)
GFR, Est Non African American: 81 mL/min/{1.73_m2} (ref >=60)
Globulin: 2.8 g/dL (calc) (ref 1.9–3.7)
Glucose, Bld: 100 mg/dL — ABNORMAL HIGH (ref 65–99)
Potassium: 3.7 mmol/L (ref 3.5–5.3)
Sodium: 138 mmol/L (ref 135–146)
Total Bilirubin: 0.6 mg/dL (ref 0.2–1.2)
Total Protein: 7.1 g/dL (ref 6.1–8.1)

## 2019-08-31 LAB — CBC WITH DIFFERENTIAL/PLATELET
Absolute Monocytes: 412 cells/uL (ref 200–950)
Basophils Absolute: 21 cells/uL (ref 0–200)
Basophils Relative: 0.5 %
Eosinophils Absolute: 42 cells/uL (ref 15–500)
Eosinophils Relative: 1 %
HCT: 37.2 % (ref 35.0–45.0)
Hemoglobin: 12.5 g/dL (ref 11.7–15.5)
Lymphs Abs: 1604 cells/uL (ref 850–3900)
MCH: 31.9 pg (ref 27.0–33.0)
MCHC: 33.6 g/dL (ref 32.0–36.0)
MCV: 94.9 fL (ref 80.0–100.0)
MPV: 10.7 fL (ref 7.5–12.5)
Monocytes Relative: 9.8 %
Neutro Abs: 2121 cells/uL (ref 1500–7800)
Neutrophils Relative %: 50.5 %
Platelets: 235 10*3/uL (ref 140–400)
RBC: 3.92 10*6/uL (ref 3.80–5.10)
RDW: 12.2 % (ref 11.0–15.0)
Total Lymphocyte: 38.2 %
WBC: 4.2 10*3/uL (ref 3.8–10.8)

## 2019-08-31 LAB — LIPID PANEL
Cholesterol: 205 mg/dL — ABNORMAL HIGH (ref <200)
HDL: 69 mg/dL (ref >=50)
LDL Cholesterol (Calc): 115 mg/dL (calc) — ABNORMAL HIGH
Non-HDL Cholesterol (Calc): 136 mg/dL (calc) — ABNORMAL HIGH (ref <130)
Total CHOL/HDL Ratio: 3 (calc) (ref <5.0)
Triglycerides: 103 mg/dL (ref <150)

## 2019-09-13 ENCOUNTER — Other Ambulatory Visit
Admission: RE | Admit: 2019-09-13 | Discharge: 2019-09-13 | Disposition: A | Discharge status: Home / Self Care | Payer: Medicare Other | Source: Ambulatory Visit | Attending: Gastroenterology | Admitting: Gastroenterology

## 2019-09-13 DIAGNOSIS — Z20822 Contact with and (suspected) exposure to covid-19: Secondary | ICD-10-CM | POA: Diagnosis not present

## 2019-09-13 DIAGNOSIS — Z01812 Encounter for preprocedural laboratory examination: Secondary | ICD-10-CM | POA: Insufficient documentation

## 2019-09-13 LAB — SARS CORONAVIRUS 2 (TAT 6-24 HRS): SARS Coronavirus 2: NEGATIVE

## 2019-09-17 ENCOUNTER — Encounter: Payer: Self-pay | Admitting: Gastroenterology

## 2019-09-17 ENCOUNTER — Ambulatory Visit
Admission: RE | Admit: 2019-09-17 | Discharge: 2019-09-17 | Disposition: A | Discharge status: Home / Self Care | Payer: Medicare Other | Attending: Gastroenterology | Admitting: Gastroenterology

## 2019-09-17 ENCOUNTER — Ambulatory Visit: Payer: Medicare Other | Admitting: Anesthesiology

## 2019-09-17 ENCOUNTER — Encounter
Admission: RE | Disposition: A | Discharge status: Home / Self Care | Payer: Self-pay | Source: Home / Self Care | Attending: Gastroenterology

## 2019-09-17 ENCOUNTER — Other Ambulatory Visit: Payer: Self-pay

## 2019-09-17 DIAGNOSIS — R1310 Dysphagia, unspecified: Secondary | ICD-10-CM | POA: Diagnosis present

## 2019-09-17 DIAGNOSIS — K219 Gastro-esophageal reflux disease without esophagitis: Secondary | ICD-10-CM | POA: Diagnosis not present

## 2019-09-17 DIAGNOSIS — I10 Essential (primary) hypertension: Secondary | ICD-10-CM | POA: Insufficient documentation

## 2019-09-17 DIAGNOSIS — K449 Diaphragmatic hernia without obstruction or gangrene: Secondary | ICD-10-CM | POA: Diagnosis not present

## 2019-09-17 DIAGNOSIS — E785 Hyperlipidemia, unspecified: Secondary | ICD-10-CM | POA: Diagnosis not present

## 2019-09-17 DIAGNOSIS — Z79890 Hormone replacement therapy (postmenopausal): Secondary | ICD-10-CM | POA: Insufficient documentation

## 2019-09-17 DIAGNOSIS — K317 Polyp of stomach and duodenum: Secondary | ICD-10-CM

## 2019-09-17 DIAGNOSIS — M1990 Unspecified osteoarthritis, unspecified site: Secondary | ICD-10-CM | POA: Insufficient documentation

## 2019-09-17 DIAGNOSIS — Z79899 Other long term (current) drug therapy: Secondary | ICD-10-CM | POA: Insufficient documentation

## 2019-09-17 DIAGNOSIS — R1319 Other dysphagia: Secondary | ICD-10-CM

## 2019-09-17 HISTORY — PX: ESOPHAGOGASTRODUODENOSCOPY (EGD) WITH PROPOFOL: SHX5813

## 2019-09-17 HISTORY — DX: Essential (primary) hypertension: I10

## 2019-09-17 SURGERY — ESOPHAGOGASTRODUODENOSCOPY (EGD) WITH PROPOFOL
Anesthesia: General

## 2019-09-17 MED ORDER — LIDOCAINE HCL (CARDIAC) PF 100 MG/5ML IV SOSY
PREFILLED_SYRINGE | INTRAVENOUS | Status: DC | PRN
  Administered 2019-09-17: 100 mg via INTRAVENOUS

## 2019-09-17 MED ORDER — PROPOFOL 10 MG/ML IV BOLUS
INTRAVENOUS | Status: DC | PRN
  Administered 2019-09-17: 30 mg via INTRAVENOUS
  Administered 2019-09-17: 40 mg via INTRAVENOUS
  Administered 2019-09-17: 70 mg via INTRAVENOUS
  Administered 2019-09-17 (×3): 20 mg via INTRAVENOUS

## 2019-09-17 MED ORDER — SODIUM CHLORIDE 0.9 % IV SOLN
INTRAVENOUS | Status: DC
  Administered 2019-09-17: 1000 mL via INTRAVENOUS

## 2019-09-17 NOTE — H&P (Signed)
[name], MD [address] [place], [place], [place], [address] [address], [place], [place], [place], [address] Phone: [phone]  Fax: [phone]  Primary Care Physician:  Verl Bangs, FNP   Pre-Procedure History & Physical: HPI:  Tracey Carroll is a 79 y.o. female is here for an EGD.   Past Medical History:  Diagnosis Date  . Acid reflux 10/19/2014  . Arthritis    arms  . Back pain, chronic 10/19/2014  . Dysphagia 10/20/2014  . Elevated BP 10/20/2014  . Fibroids, intramural 10/19/2014  . FOM (frequency of micturition) 10/19/2014  . GERD (gastroesophageal reflux disease)   . Hyperlipidemia   . Hypertension   . 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 LBP (low back pain) 10/19/2014    Past Surgical History:  Procedure Laterality Date  . ESOPHAGOGASTRODUODENOSCOPY (EGD) WITH PROPOFOL N/A 11/20/2014   Procedure: ESOPHAGOGASTRODUODENOSCOPY (EGD) WITH PROPOFOL with dilation;  Surgeon: Lucilla Lame, MD;  Location: [facility];  Service: Endoscopy;  Laterality: N/A;  . TUBAL LIGATION      Prior to Admission medications   Medication Sig Start Date End Date Taking? Authorizing Provider  atorvastatin (LIPITOR) 20 MG tablet TAKE 1 TABLET BY MOUTH ONCE DAILY. APPOINTMENT NEEDED FOR FURTHER REFILLS. 07/16/19   Verl Bangs, FNP  conjugated estrogens (PREMARIN) vaginal cream Apply 0.5mg  (pea-sized amount)  just inside the vaginal introitus with a finger-tip on  Monday, Wednesday and Friday nights. 04/26/19   Zara Council A, PA-C  hydrochlorothiazide (HYDRODIURIL) 12.5 MG tablet Take 1 tablet (12.5 mg total) by mouth daily. 07/16/19   Malfi, Lupita Raider, FNP  losartan (COZAAR) 50 MG tablet Take 1 tablet (50 mg total) by mouth daily. 07/16/19   Malfi, Lupita Raider, FNP  omeprazole (PRILOSEC) 40 MG capsule Take 1 capsule (40 mg total) by mouth 2 (two) times daily. 07/16/19   Malfi, Lupita Raider, FNP    Allergies as of 07/31/2019  . (No Known Allergies)    Family History  Problem  Relation Age of Onset  . Diabetes Father        in her 22 and 39s  . Hypertension Daughter        her 57s  . Kidney cancer Neg Hx   . Bladder Cancer Neg Hx     Social History   Socioeconomic History  . Marital status: Single    Spouse name: Not on file  . Number of children: Not on file  . Years of education: Not on file  . Highest education level: 12th grade  Occupational History  . Occupation: retired  Tobacco Use  . Smoking status: Never Smoker  . Smokeless tobacco: Never Used  Substance and Sexual Activity  . Alcohol use: No    Alcohol/week: 0.0 standard drinks  . Drug use: No  . Sexual activity: Not on file  Other Topics Concern  . Not on file  Social History Narrative   ** Merged History Encounter **       Social Determinants of Health   Financial Resource Strain: Low Risk   . Difficulty of Paying Living Expenses: Not hard at all  Food Insecurity: No Food Insecurity  . Worried About Charity fundraiser in the Last Year: Never true  . Ran Out of Food in the Last Year: Never true  Transportation Needs: No Transportation Needs  . Lack of Transportation (Medical): No  . Lack of Transportation (Non-Medical): No  Physical Activity: Inactive  . Days of Exercise per Week: 0 days  .  Minutes of Exercise per Session: 0 min  Stress: No Stress Concern Present  . Feeling of Stress : Not at all  Social Connections: Somewhat Isolated  . Frequency of Communication with Friends and Family: More than three times a week  . Frequency of Social Gatherings with Friends and Family: More than three times a week  . Attends Religious Services: More than 4 times per year  . Active Member of Clubs or Organizations: No  . Attends Archivist Meetings: Never  . Marital Status: Widowed  Intimate Partner Violence: Not At Risk  . Fear of Current or Ex-Partner: No  . Emotionally Abused: No  . Physically Abused: No  . Sexually Abused: No    Review of Systems: See HPI,  otherwise negative ROS  Physical Exam: BP (!) 135/105   Pulse 93   Temp (!) 97.3 F (36.3 C) (Tympanic)   Resp 18   Ht 4\' 11"  (1.499 m)   Wt 81.6 kg   SpO2 100%   BMI 36.36 kg/m  General:   Alert,  pleasant and cooperative in NAD Head:  Normocephalic and atraumatic. Neck:  Supple; no masses or thyromegaly. Lungs:  Clear throughout to auscultation, normal respiratory effort.    Heart:  +S1, +S2, Regular rate and rhythm, No edema. Abdomen:  Soft, nontender and nondistended. Normal bowel sounds, without guarding, and without rebound.   Neurologic:  Alert and  oriented x4;  grossly normal neurologically.  Impression/Plan: Tracey Carroll is here for an EGD for dysphagia.  Risks, benefits, limitations, and alternatives regarding the procedure have been reviewed with the patient.  Questions have been answered.  All parties agreeable.   Virgel Manifold, MD  09/17/2019, 10:11 AM

## 2019-09-17 NOTE — Transfer of Care (Signed)
Immediate Anesthesia Transfer of Care Note  Patient: Tracey Carroll  Procedure(s) Performed: ESOPHAGOGASTRODUODENOSCOPY (EGD) WITH PROPOFOL (N/A )  Patient Location: PACU  Anesthesia Type:General  Level of Consciousness: sedated  Airway & Oxygen Therapy: Patient Spontanous Breathing and Patient connected to nasal cannula oxygen  Post-op Assessment: Report given to RN and Post -op Vital signs reviewed and stable  Post vital signs: Reviewed and stable  Last Vitals:  Vitals Value Taken Time  BP    Temp    Pulse 97 09/17/19 1035  Resp 17 09/17/19 1035  SpO2 98 % 09/17/19 1035  Vitals shown include unvalidated device data.  Last Pain:  Vitals:   09/17/19 0911  TempSrc: Tympanic  PainSc: 0-No pain         Complications: No apparent anesthesia complications

## 2019-09-17 NOTE — Anesthesia Postprocedure Evaluation (Signed)
Anesthesia Post Note  Patient: Tracey Carroll  Procedure(s) Performed: ESOPHAGOGASTRODUODENOSCOPY (EGD) WITH PROPOFOL (N/A )  Patient location during evaluation: Endoscopy Anesthesia Type: General Level of consciousness: awake and alert Pain management: pain level controlled Vital Signs Assessment: post-procedure vital signs reviewed and stable Respiratory status: spontaneous breathing and respiratory function stable Cardiovascular status: stable Anesthetic complications: no     Last Vitals:  Vitals:   09/17/19 1100 09/17/19 1110  BP: 110/70   Pulse: 76 76  Resp: 15   Temp:    SpO2: 99%     Last Pain:  Vitals:   09/17/19 1030  TempSrc: Tympanic  PainSc:                  Letonya Mangels K

## 2019-09-17 NOTE — Op Note (Signed)
[facility] Patient Name: Tracey Carroll Procedure Date: 09/17/2019 10:09 AM MRN: [id number] Account #: [id number] Date of Birth: 08-30-1940 Admit Type: Outpatient Age: 79 Room: [facility] ENDO ROOM 2 Gender: Female Note Status: Finalized Procedure:             Upper GI endoscopy Indications:           Dysphagia Providers:             Elyanna Wallick B. Bonna Gains MD, MD Referring MD:          Lupita Raider. Malfi (Referring MD) Medicines:             Monitored Anesthesia Care Complications:         No immediate complications. Procedure:             Pre-Anesthesia Assessment:                        - Prior to the procedure, a History and Physical was                         performed, and patient medications, allergies and                         sensitivities were reviewed. The patient's tolerance                         of previous anesthesia was reviewed.                        - The risks and benefits of the procedure and the                         sedation options and risks were discussed with the                         patient. All questions were answered and informed                         consent was obtained.                        - Patient identification and proposed procedure were                         verified prior to the procedure by the physician, the                         nurse, the anesthesiologist, the anesthetist and the                         technician. The procedure was verified in the                         procedure room.                        - ASA Grade Assessment: II - A patient with mild                         systemic disease.  After obtaining informed consent, the endoscope was                         passed under direct vision. Throughout the procedure,                         the patient's blood pressure, pulse, and oxygen                         saturations were monitored continuously. The Endoscope                          was introduced through the mouth, and advanced to the                         second part of duodenum. The upper GI endoscopy was                         accomplished with ease. The patient tolerated the                         procedure well. Findings:      The upper esophageal sphincter showed resistance during intubation of       the esophagus. No lesions were seen in this area, but this is the likely       cause of patient's dysphagia.      The examined esophagus was normal. Biopsies were obtained from the       proximal and distal esophagus with cold forceps for histology of       suspected eosinophilic esophagitis.      The entire examined stomach was normal.      A single 7 mm sessile polyp with no bleeding and no stigmata of recent       bleeding was found in the gastric antrum. Biopsies were taken with a       cold forceps for histology.      Multiple 2 to 3 mm sessile polyps with no bleeding and no stigmata of       recent bleeding were found in the gastric body. Biopsies were taken with       a cold forceps for histology.      A small hiatal hernia was present.      The duodenal bulb, second portion of the duodenum and examined duodenum       were normal. Impression:            - The upper esophageal sphincter showed resistance                         during intubation of the esophagus. No lesions were                         seen in this area, but this is the likely cause of                         patient's dysphagia.                        - Normal esophagus. Biopsied.                        -  Normal stomach.                        - A single gastric polyp. Biopsied.                        - Multiple gastric polyps. Biopsied.                        - Small hiatal hernia.                        - Normal duodenal bulb, second portion of the duodenum                         and examined duodenum. Recommendation:        - Await pathology results.                         - - Refer to ENT for evaluation of proximal esophagus                         and evaluate for possible cricopharyngeal bar                        - May need referral for removal of gastric antral                         polyp after pathology results                        - Discharge patient to home (with escort).                        - Advance diet as tolerated.                        - Continue present medications.                        - Patient has a contact number available for                         emergencies. The signs and symptoms of potential                         delayed complications were discussed with the patient.                         Return to normal activities tomorrow. Written                         discharge instructions were provided to the patient.                        - Discharge patient to home (with escort).                        - The findings and recommendations were discussed with  the patient.                        - The findings and recommendations were discussed with                         the patient's family. Procedure Code(s):     --- Professional ---                        [phone], Esophagogastroduodenoscopy, flexible,                         transoral; with biopsy, single or multiple Diagnosis Code(s):     --- Professional ---                        K31.7, Polyp of stomach and duodenum                        R13.10, Dysphagia, unspecified CPT copyright 2019 American Medical Association. All rights reserved. The codes documented in this report are preliminary and upon coder review may  be revised to meet current compliance requirements.  [name], MD Margretta Sidle B. Bonna Gains MD, MD 09/17/2019 10:41:54 AM This report has been signed electronically. Number of Addenda: 0 Note Initiated On: 09/17/2019 10:09 AM Estimated Blood Loss:  Estimated blood loss: none.      [facility]

## 2019-09-17 NOTE — Anesthesia Preprocedure Evaluation (Signed)
Anesthesia Evaluation  Patient identified by MRN, date of birth, ID band Patient awake    Reviewed: Allergy & Precautions, NPO status , Patient's Chart, lab work & pertinent test results  History of Anesthesia Complications Negative for: history of anesthetic complications  Airway Mallampati: III       Dental  (+) Missing   Pulmonary neg sleep apnea, neg COPD, Not current smoker,           Cardiovascular hypertension, Pt. on medications (-) Past MI and (-) CHF (-) dysrhythmias (-) Valvular Problems/Murmurs     Neuro/Psych neg Seizures    GI/Hepatic Neg liver ROS, hiatal hernia, GERD  Medicated and Controlled,  Endo/Other  neg diabetes  Renal/GU negative Renal ROS     Musculoskeletal   Abdominal   Peds  Hematology   Anesthesia Other Findings   Reproductive/Obstetrics                            Anesthesia Physical Anesthesia Plan  ASA: II  Anesthesia Plan: General   Post-op Pain Management:    Induction: Intravenous  PONV Risk Score and Plan: 3 and Propofol infusion, TIVA and Treatment may vary due to age or medical condition  Airway Management Planned: Nasal Cannula  Additional Equipment:   Intra-op Plan:   Post-operative Plan:   Informed Consent: I have reviewed the patients History and Physical, chart, labs and discussed the procedure including the risks, benefits and alternatives for the proposed anesthesia with the patient or authorized representative who has indicated his/her understanding and acceptance.       Plan Discussed with:   Anesthesia Plan Comments:         Anesthesia Quick Evaluation

## 2019-09-18 ENCOUNTER — Telehealth: Payer: Self-pay

## 2019-09-18 ENCOUNTER — Encounter: Payer: Self-pay | Admitting: *Deleted

## 2019-09-18 DIAGNOSIS — R1310 Dysphagia, unspecified: Secondary | ICD-10-CM

## 2019-09-18 DIAGNOSIS — K317 Polyp of stomach and duodenum: Secondary | ICD-10-CM

## 2019-09-18 LAB — SURGICAL PATHOLOGY

## 2019-09-18 NOTE — Telephone Encounter (Signed)
-----   Message from Virgel Manifold, MD sent at 09/18/2019  3:01 PM EDT ----- please let the patient know, her biopsies were negative. 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 Here is there contact info [email or web address]  She also needs referral to Dr. Rush Landmark for hyperplastic gastric antral polyp removal

## 2019-09-18 NOTE — Telephone Encounter (Signed)
Called patient but was not able to leave a voicemail since it is not set-up. I will call patient again tomorrow.

## 2019-09-19 NOTE — Telephone Encounter (Signed)
-----   Message from Virgel Manifold, MD sent at 09/18/2019  3:01 PM EDT ----- please let the patient know, her biopsies were negative. 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 Here is there contact info [email or web address]  She also needs referral to Dr. Rush Landmark for hyperplastic gastric antral polyp removal

## 2019-09-19 NOTE — Telephone Encounter (Signed)
Referral to [place]: [facility] and spoke with Wells Guiles and she stated for me to send the referral to [phone] and they will call the patient to schedule a consult. This will be faxed today.   Referral to Dr. Rush Landmark was sent today via Epic.

## 2019-10-01 NOTE — Telephone Encounter (Signed)
Called patient to ask if she had received a call from [facility] and she stated that she had and that she had an appointment scheduled with them on 11/21/19. I also checked her referral to see Dr. [facility] and it had been closed since one of their physicians reviewed patient's EGD and did not see that the patient needed removal of her hyperplastic gastric antral polyp.

## 2019-10-02 ENCOUNTER — Ambulatory Visit (INDEPENDENT_AMBULATORY_CARE_PROVIDER_SITE_OTHER): Payer: Medicare Other | Admitting: Gastroenterology

## 2019-10-02 ENCOUNTER — Other Ambulatory Visit: Payer: Self-pay

## 2019-10-02 ENCOUNTER — Encounter: Payer: Self-pay | Admitting: Gastroenterology

## 2019-10-02 VITALS — BP 120/82 | HR 89 | Temp 98.4°F | Wt 187.6 lb

## 2019-10-02 DIAGNOSIS — R1310 Dysphagia, unspecified: Secondary | ICD-10-CM

## 2019-10-03 NOTE — Progress Notes (Signed)
Tracey Antigua, MD [address]  [place]  [place], [place] [address]  Main: [phone]  Fax: [phone]   Primary Care Physician: Verl Bangs, FNP   Chief Complaint  Patient presents with  . Follow-up    Patient is here today to follow up to go over her EGD results. Patient had been referred to [facility] (11/21/19) and Dr. Rush Landmark does not recommend surgery.    HPI: Tracey Carroll is a 79 y.o. female here for follow-up of dysphagia.  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  Patient has been referred to [facility] for this as this is the cause of her dysphagia and has an upcoming appointment.  She also had a 7 mm gastric antral polyp with biopsies showing hyperplastic polyp.  She was referred to [facility] for removal of this.  They reviewed her chart and do not recommend removal.  Please see their notes under the "referrals" tab, and referral date of Sep 19, 2019, with Dr. Olena Heckle message being the following:  From: Milus Banister, MD Sent: 09/20/2019   8:23 AM EDT To: Lorrin Jackson, RN, *  I reviewed the EGD images, report and path.  I don't think there is a need to remove this hyperplastic polyp (71mm).  It was incidental, she is not anemic and it is not at all concerning morphologically.   Current Outpatient Medications  Medication Sig Dispense Refill  . atorvastatin (LIPITOR) 20 MG tablet TAKE 1 TABLET BY MOUTH ONCE DAILY. APPOINTMENT NEEDED FOR FURTHER REFILLS. 90 tablet 1  . conjugated estrogens (PREMARIN) vaginal cream Apply 0.5mg  (pea-sized amount)  just inside the vaginal introitus with a finger-tip on  Monday, Wednesday and Friday nights. 30 g 12  . hydrochlorothiazide (HYDRODIURIL) 12.5 MG tablet Take 1 tablet (12.5 mg total) by mouth daily. 90 tablet 1  . losartan (COZAAR) 50 MG tablet Take 1 tablet (50 mg total) by mouth daily. 90 tablet 1  . omeprazole (PRILOSEC)  40 MG capsule Take 1 capsule (40 mg total) by mouth 2 (two) times daily. 180 capsule 1   No current facility-administered medications for this visit.    Allergies as of 10/02/2019  . (No Known Allergies)    ROS:  General: Negative for anorexia, weight loss, fever, chills, fatigue, weakness. ENT: Negative for hoarseness, difficulty swallowing , nasal congestion. CV: Negative for chest pain, angina, palpitations, dyspnea on exertion, peripheral edema.  Respiratory: Negative for dyspnea at rest, dyspnea on exertion, cough, sputum, wheezing.  GI: See history of present illness. GU:  Negative for dysuria, hematuria, urinary incontinence, urinary frequency, nocturnal urination.  Endo: Negative for unusual weight change.    Physical Examination:   BP 120/82   Pulse 89   Temp 98.4 F (36.9 C) (Oral)   Wt 187 lb 9.6 oz (85.1 kg)   BMI 37.89 kg/m   General: Well-nourished, well-developed in no acute distress.  Eyes: No icterus. Conjunctivae pink. Mouth: Oropharyngeal mucosa moist and pink , no lesions erythema or exudate. Neck: Supple, Trachea midline Abdomen: Bowel sounds are normal, nontender, nondistended, no hepatosplenomegaly or masses, no abdominal bruits or hernia , no rebound or guarding.   Extremities: No lower extremity edema. No clubbing or deformities. Neuro: Alert and oriented x 3.  Grossly intact. Skin: Warm and dry, no jaundice.   Psych: Alert and cooperative, normal mood and affect.   Labs: CMP     Component Value Date/Time  NA 138 08/30/2019 0911   K 3.7 08/30/2019 0911   CL 100 08/30/2019 0911   CO2 28 08/30/2019 0911   GLUCOSE 100 (H) 08/30/2019 0911   BUN 8 08/30/2019 0911   BUN 14 01/04/2017 1622   CREATININE 0.72 08/30/2019 0911   CALCIUM 9.5 08/30/2019 0911   PROT 7.1 08/30/2019 0911   ALBUMIN 4.0 12/09/2016 0900   AST 19 08/30/2019 0911   ALT 10 08/30/2019 0911   ALKPHOS 63 12/09/2016 0900   BILITOT 0.6 08/30/2019 0911   GFRNONAA 81 08/30/2019  0911   GFRAA 94 08/30/2019 0911   Lab Results  Component Value Date   WBC 4.2 08/30/2019   HGB 12.5 08/30/2019   HCT 37.2 08/30/2019   MCV 94.9 08/30/2019   PLT 235 08/30/2019    Imaging Studies: No results found.  Assessment and Plan:   [name] is a 79 y.o. 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  Follow-up with [facility] for this and appointment has been scheduled for July 2020  [facility], Dr. Olena Heckle does not recommend removal of gastric polyp after he reviewed patient's chart and pathology findings  Follow-up with [place] after [facility] assessment  Dr Tracey Carroll

## 2019-10-18 ENCOUNTER — Ambulatory Visit: Payer: Medicare Other | Admitting: Family Medicine

## 2019-10-18 ENCOUNTER — Encounter: Payer: Self-pay | Admitting: Family Medicine

## 2019-10-18 ENCOUNTER — Other Ambulatory Visit: Payer: Self-pay

## 2019-10-18 ENCOUNTER — Ambulatory Visit (INDEPENDENT_AMBULATORY_CARE_PROVIDER_SITE_OTHER): Payer: Medicare Other | Admitting: Family Medicine

## 2019-10-18 VITALS — BP 124/71 | HR 67 | Temp 97.1°F | Ht 59.0 in | Wt 187.6 lb

## 2019-10-18 DIAGNOSIS — I10 Essential (primary) hypertension: Secondary | ICD-10-CM | POA: Diagnosis not present

## 2019-10-18 DIAGNOSIS — R1310 Dysphagia, unspecified: Secondary | ICD-10-CM

## 2019-10-18 DIAGNOSIS — H04129 Dry eye syndrome of unspecified lacrimal gland: Secondary | ICD-10-CM | POA: Insufficient documentation

## 2019-10-18 NOTE — Progress Notes (Signed)
Subjective:    Patient ID: Tracey Carroll, female    DOB: January 09, 1941, 79 y.o.   MRN: [id number]  Tracey Carroll is a 79 y.o. female presenting on 10/18/2019 for Dysphagia (pt notice some improvement, but currently have an appt scheduled for 11/21/19 w/ Gastroenterlogist )   HPI  Hypertension - She is not checking BP at home or outside of clinic.    - Current medications: hydrochlorothiazide 12.24m daily, losartan 548mdaily, tolerating well without side effects - She is not currently symptomatic. - Pt denies headache, lightheadedness, dizziness, changes in vision, chest tightness/pressure, palpitations, leg swelling, sudden loss of speech or loss of consciousness. - She  reports no regular exercise routine. - Her diet is moderate in salt, moderate in fat, and moderate in carbohydrates.  Ms. HaHefferaneports she has met with gastroenterology with some improvement in her dysphagia but [facility] her [facility].  Denies any acute concerns today for dysphagia.  Has concerns for dry eye.  Reports has met with her eye doctor with [facility] recently and was told she has dry eye and can use over the counter eye drops.  Has been using Visine eye drops and reports that she has worsened dry eyes.  Denies visual changes.   [facility] 04/17/2019 09/12/2018 03/12/2018  Decreased Interest 0 0 0  Down, Depressed, Hopeless 0 0 0  PHQ - 2 Score 0 0 0    Social History   Tobacco Use  . Smoking status: Never Smoker  . Smokeless tobacco: Never Used  Substance Use Topics  . Alcohol use: No    Alcohol/week: 0.0 standard drinks  . Drug use: No    Review of Systems  Constitutional: Negative.   HENT: Positive for trouble swallowing. Negative for congestion, dental problem, drooling, ear discharge, ear pain, facial swelling, hearing loss, mouth sores, nosebleeds, postnasal drip, rhinorrhea, sinus pressure, sinus pain, sneezing,  sore throat, tinnitus and voice change.   Eyes: Negative.        Dry eyes  Respiratory: Negative.   Cardiovascular: Negative.   Gastrointestinal: Negative.   Endocrine: Negative.   Genitourinary: Negative.   Musculoskeletal: Negative.   Skin: Negative.   Allergic/Immunologic: Negative.   Neurological: Negative.   Hematological: Negative.   Psychiatric/Behavioral: Negative.    Per HPI unless specifically indicated above     Objective:    BP 124/71 (BP Location: Left Arm, Patient Position: Sitting, Cuff Size: Normal)   Pulse 67   Temp (!) 97.1 F (36.2 C) (Temporal)   Ht 4' 11"  (1.499 m)   Wt 187 lb 9.6 oz (85.1 kg)   BMI 37.89 kg/m   Wt Readings from Last 3 Encounters:  10/18/19 187 lb 9.6 oz (85.1 kg)  10/02/19 187 lb 9.6 oz (85.1 kg)  09/17/19 180 lb (81.6 kg)    Physical Exam Vitals reviewed.  Constitutional:      General: She is not in acute distress.    Appearance: Normal appearance. She is well-developed and well-groomed. She is obese. She is not ill-appearing or toxic-appearing.  HENT:     Head: Normocephalic.     Nose:     Comments: FaLizbeth Carroll in place, covering mouth and nose  Eyes:     General: Lids are normal. Vision grossly intact.        Right eye: No discharge.        Left eye: No discharge.  Extraocular Movements: Extraocular movements intact.     Conjunctiva/sclera: Conjunctivae normal.     Pupils: Pupils are equal, round, and reactive to light.  Cardiovascular:     Rate and Rhythm: Normal rate and regular rhythm.     Pulses: Normal pulses.          Dorsalis pedis pulses are 2+ on the right side and 2+ on the left side.       Posterior tibial pulses are 2+ on the right side and 2+ on the left side.     Heart sounds: Normal heart sounds. No murmur. No friction rub. No gallop.   Pulmonary:     Effort: Pulmonary effort is normal. No respiratory distress.     Breath sounds: Normal breath sounds.  Musculoskeletal:        General: Normal range  of motion.     Cervical back: Normal range of motion.     Right lower leg: 1+ Edema present.     Left lower leg: 1+ Edema present.  Feet:     Right foot:     Skin integrity: Skin integrity normal.     Left foot:     Skin integrity: Skin integrity normal.  Skin:    General: Skin is warm and dry.     Capillary Refill: Capillary refill takes less than 2 seconds.  Neurological:     General: No focal deficit present.     Mental Status: She is alert and oriented to person, place, and time.     Cranial Nerves: Cranial nerves are intact.     Sensory: Sensation is intact.     Motor: Motor function is intact.     Coordination: Coordination is intact.     Gait: Gait is intact.  Psychiatric:        Attention and Perception: Attention and perception normal.        Mood and Affect: Mood and affect normal.        Speech: Speech normal.        Behavior: Behavior normal. Behavior is cooperative.        Thought Content: Thought content normal.        Cognition and Memory: Cognition and memory normal.        Judgment: Judgment normal.     Results for orders placed or performed during the hospital encounter of 09/17/19  Surgical pathology  Result Value Ref Range   SURGICAL PATHOLOGY      SURGICAL PATHOLOGY CASE: [id number] PATIENT: [facility] Surgical Pathology Report     Specimen Submitted: A. Stomach polyp, antrum; cbx B. Stomach polyp x2, body; cbx C. Esophagus; cbx  Clinical History: Dysphagia R13.10.  Gastric polyps    DIAGNOSIS: A. GASTRIC POLYP, ANTRUM; COLD BIOPSY: - HYPERPLASTIC POLYP. - NEGATIVE FOR DYSPLASIA AND MALIGNANCY.  B. GASTRIC POLYPS X2, BODY; COLD BIOPSY: - FRAGMENTS SUGGESTIVE OF FUNDIC GLAND POLYPS. - NEGATIVE FOR H. PYLORI, DYSPLASIA, AND MALIGNANCY.  C. ESOPHAGUS; COLD BIOPSY: - SQUAMOCOLUMNAR MUCOSA WITH NO SIGNIFICANT HISTOPATHOLOGIC CHANGE. - NEGATIVE FOR INTESTINAL METAPLASIA, DYSPLASIA, AND MALIGNANCY.  GROSS DESCRIPTION: A. Labeled:  Gastric antrum polyp cold biopsy Received: In formalin Tissue fragment(s): 1 Size: 0.3 cm Description: Tan tissue fragment Entirely submitted in 1 cassette.  B. Labeled: Gastric body polyps x2 cold biopsy Received: In formalin Tissue fragment(s): 2 Size: 0.3 x 0.3  x 0.1 cm Description: Aggregate of tan tissue fragments Entirely submitted in 1 cassette.  C. Labeled: Esophageal cold biopsies rule out EOE Received: In formalin  Tissue fragment(s): 3 Size: 0.4 x 0.3 x 0.1 cm Description: Aggregate of pink-white tissue fragments Entirely submitted in 1 cassette.   Final Diagnosis performed by Allena Napoleon, MD.   Electronically signed 09/18/2019 10:54:27AM The electronic signature indicates that the named Attending Pathologist has evaluated the specimen Technical component performed at [facility], [address], [place], [place] [address] Lab: [phone] Dir: Rush Farmer, MD, MMM  Professional component performed at [facility], [facility], [place], [place], [place] [address] Lab: [phone] Dir: Dellia Nims. Reuel Derby, MD       Assessment & Plan:   Problem List Items Addressed This Visit      Cardiovascular and Mediastinum   Essential hypertension - Primary    Controlled hypertension.  BP is at goal < 130/80.  Taking medications tolerating well without side effects. Complications: obesity, hyperlipidemia, GERD  Plan: 1. Continue taking HCTZ 12.25m daily and losartan 551mdaily 2. Encouraged heart healthy diet and increasing exercise to 30 minutes most days of the week, going no more than 2 days in a row without exercise. 3. Check BP 1-2 x per week at home, keep log, and bring to clinic at next appointment. 4. Follow up 6 months.         Relevant Orders   POCT Urinalysis Dipstick     Digestive   Dysphagia    Rep[facility]        Other   Dry eye    Has met  with [facility] and was told had dry eyes and can use an over the counter eye drop.  Patient has been using Visine which has given temporary relief with then worsening dry eye symptoms.  Plan: 1. Can use an over the counter lubricating eye drop, such as Systane.  Discussed if having difficulty finding this eye drop over the counter, can ask the pharmacist for assistance 2. Follow up with [facility] needed for this         No orders of the defined types were placed in this encounter.  Follow up plan: Return in about 6 months (around 04/18/2020) for HTN F/U.   NiHarlin RainFNGuys Millsamily Nurse Practitioner [facility] 10/18/2019, 10:41 AM

## 2019-10-18 NOTE — Patient Instructions (Signed)
Keep your appointment with [facility] with the Ear, Nose and Throat doctor on 11/21/2019.  The eye drops that are available over the counter are called "Systane" and are lubricating eye drops.  If you are unable to find this specific brand, you can ask the pharmacist with assistance finding lubricating eye drops.  Try to get exercise a minimum of 30 minutes per day at least 5 days per week as well as  adequate water intake all while measuring blood pressure a few times per week.  Keep a blood pressure log and bring back to clinic at your next visit.  If your readings are consistently over 130/80 to contact our office/send me a MyChart message and we will see you sooner.  Can try DASH and Mediterranean diet options, avoiding processed foods, lowering sodium intake, avoiding pork products, and eating a plant based diet for optimal health.  We will plan to see you back in 6 months for hypertension follow up  You will receive a survey after today's visit either digitally by e-mail or paper by [place] mail. Your experiences and feedback matter to [place].  Please respond so we know how we are doing as we provide care for you.  Call us with any questions/concerns/needs.  It is my goal to be available to you for your health concerns.  Thanks for choosing me to be a partner in your healthcare needs!  Harlin Rain, FNP-C Family Nurse Practitioner [facility] Phone: [phone]

## 2019-10-18 NOTE — Assessment & Plan Note (Signed)
Controlled hypertension.  BP is at goal < 130/80.  Taking medications tolerating well without side effects. Complications: obesity, hyperlipidemia, GERD  Plan: 1. Continue taking HCTZ 12.5mg  daily and losartan 50mg  daily 2. Encouraged heart healthy diet and increasing exercise to 30 minutes most days of the week, going no more than 2 days in a row without exercise. 3. Check BP 1-2 x per week at home, keep log, and bring to clinic at next appointment. 4. Follow up 6 months.

## 2019-10-18 NOTE — Assessment & Plan Note (Signed)
Reports has appointment for throat evaluation with [facility] on 11/21/2019 for continued dysphagia.

## 2019-10-18 NOTE — Assessment & Plan Note (Signed)
Has met with [facility] and was told had dry eyes and can use an over the counter eye drop.  Patient has been using Visine which has given temporary relief with then worsening dry eye symptoms.  Plan: 1. Can use an over the counter lubricating eye drop, such as Systane.  Discussed if having difficulty finding this eye drop over the counter, can ask the pharmacist for assistance 2. Follow up with [facility] as needed for this

## 2019-10-22 ENCOUNTER — Ambulatory Visit: Payer: Medicare Other | Admitting: Family Medicine

## 2019-10-22 ENCOUNTER — Ambulatory Visit (INDEPENDENT_AMBULATORY_CARE_PROVIDER_SITE_OTHER): Payer: Medicare Other

## 2019-10-22 DIAGNOSIS — Z0000 Encounter for general adult medical examination without abnormal findings: Secondary | ICD-10-CM

## 2019-10-22 DIAGNOSIS — Z1231 Encounter for screening mammogram for malignant neoplasm of breast: Secondary | ICD-10-CM | POA: Diagnosis not present

## 2019-10-22 NOTE — Patient Instructions (Signed)
Tracey Carroll , Thank you for taking time to come for your Medicare Wellness Visit. I appreciate your ongoing commitment to your health goals. Please review the following plan we discussed and let me know if I can assist you in the future.   Screening recommendations/referrals: Colonoscopy: Up to date Mammogram: Ordered Bone Density: 2016 Recommended yearly ophthalmology/optometry visit for glaucoma screening and checkup Recommended yearly dental visit for hygiene and checkup  Vaccinations: Influenza vaccine: Due 01/2020 Pneumococcal vaccine: Up to date Tdap vaccine: Up to date Shingles vaccine: Discuss with pharmacy Covid-19:Not completed  Advanced directives: Declined  Conditions/risks identified: See problem list  Next appointment: Follow up in one year for your annual wellness visit 10/27/20 @8 :20am.   Preventive Care 42 Years and Older, Female Preventive care refers to lifestyle choices and visits with your health care provider that can promote health and wellness. What does preventive care include?  A yearly physical exam. This is also called an annual well check.  Dental exams once or twice a year.  Routine eye exams. Ask your health care provider how often you should have your eyes checked.  Personal lifestyle choices, including:  Daily care of your teeth and gums.  Regular physical activity.  Eating a healthy diet.  Avoiding tobacco and drug use.  Limiting alcohol use.  Practicing safe sex.  Taking low-dose aspirin every day.  Taking vitamin and mineral supplements as recommended by your health care provider. What happens during an annual well check? The services and screenings done by your health care provider during your annual well check will depend on your age, overall health, lifestyle risk factors, and family history of disease. Counseling  Your health care provider may ask you questions about your:  Alcohol use.  Tobacco use.  Drug  use.  Emotional well-being.  Home and relationship well-being.  Sexual activity.  Eating habits.  History of falls.  Memory and ability to understand (cognition).  Work and work Statistician.  Reproductive health. Screening  You may have the following tests or measurements:  Height, weight, and BMI.  Blood pressure.  Lipid and cholesterol levels. These may be checked every 5 years, or more frequently if you are over 100 years old.  Skin check.  Lung cancer screening. You may have this screening every year starting at age 71 if you have a 30-pack-year history of smoking and currently smoke or have quit within the past 15 years.  Fecal occult blood test (FOBT) of the stool. You may have this test every year starting at age 61.  Flexible sigmoidoscopy or colonoscopy. You may have a sigmoidoscopy every 5 years or a colonoscopy every 10 years starting at age 44.  Hepatitis C blood test.  Hepatitis B blood test.  Sexually transmitted disease (STD) testing.  Diabetes screening. This is done by checking your blood sugar (glucose) after you have not eaten for a while (fasting). You may have this done every 1-3 years.  Bone density scan. This is done to screen for osteoporosis. You may have this done starting at age 61.  Mammogram. This may be done every 1-2 years. Talk to your health care provider about how often you should have regular mammograms. Talk with your health care provider about your test results, treatment options, and if necessary, the need for more tests. Vaccines  Your health care provider may recommend certain vaccines, such as:  Influenza vaccine. This is recommended every year.  Tetanus, diphtheria, and acellular pertussis (Tdap, Td) vaccine. You may need a  Td booster every 10 years.  Zoster vaccine. You may need this after age 35.  Pneumococcal 13-valent conjugate (PCV13) vaccine. One dose is recommended after age 49.  Pneumococcal polysaccharide  (PPSV23) vaccine. One dose is recommended after age 66. Talk to your health care provider about which screenings and vaccines you need and how often you need them. This information is not intended to replace advice given to you by your health care provider. Make sure you discuss any questions you have with your health care provider. Document Released: 05/29/2015 Document Revised: 01/20/2016 Document Reviewed: 03/03/2015 Elsevier Interactive Patient Education  2017 [place] Prevention in the Home Falls can cause injuries. They can happen to people of all ages. There are many things you can do to make your home safe and to help prevent falls. What can I do on the outside of my home?  Regularly fix the edges of walkways and driveways and fix any cracks.  Remove anything that might make you trip as you walk through a door, such as a raised step or threshold.  Trim any bushes or trees on the path to your home.  Use bright outdoor lighting.  Clear any walking paths of anything that might make someone trip, such as rocks or tools.  Regularly check to see if handrails are loose or broken. Make sure that both sides of any steps have handrails.  Any raised decks and porches should have guardrails on the edges.  Have any leaves, snow, or ice cleared regularly.  Use sand or salt on walking paths during winter.  Clean up any spills in your garage right away. This includes oil or grease spills. What can I do in the bathroom?  Use night lights.  Install grab bars by the toilet and in the tub and shower. Do not use towel bars as grab bars.  Use non-skid mats or decals in the tub or shower.  If you need to sit down in the shower, use a plastic, non-slip stool.  Keep the floor dry. Clean up any water that spills on the floor as soon as it happens.  Remove soap buildup in the tub or shower regularly.  Attach bath mats securely with double-sided non-slip rug tape.  Do not have  throw rugs and other things on the floor that can make you trip. What can I do in the bedroom?  Use night lights.  Make sure that you have a light by your bed that is easy to reach.  Do not use any sheets or blankets that are too big for your bed. They should not hang down onto the floor.  Have a firm chair that has side arms. You can use this for support while you get dressed.  Do not have throw rugs and other things on the floor that can make you trip. What can I do in the kitchen?  Clean up any spills right away.  Avoid walking on wet floors.  Keep items that you use a lot in easy-to-reach places.  If you need to reach something above you, use a strong step stool that has a grab bar.  Keep electrical cords out of the way.  Do not use floor polish or wax that makes floors slippery. If you must use wax, use non-skid floor wax.  Do not have throw rugs and other things on the floor that can make you trip. What can I do with my stairs?  Do not leave any items on the stairs.  Make sure that there are handrails on both sides of the stairs and use them. Fix handrails that are broken or loose. Make sure that handrails are as long as the stairways.  Check any carpeting to make sure that it is firmly attached to the stairs. Fix any carpet that is loose or worn.  Avoid having throw rugs at the top or bottom of the stairs. If you do have throw rugs, attach them to the floor with carpet tape.  Make sure that you have a light switch at the top of the stairs and the bottom of the stairs. If you do not have them, ask someone to add them for you. What else can I do to help prevent falls?  Wear shoes that:  Do not have high heels.  Have rubber bottoms.  Are comfortable and fit you well.  Are closed at the toe. Do not wear sandals.  If you use a stepladder:  Make sure that it is fully opened. Do not climb a closed stepladder.  Make sure that both sides of the stepladder are  locked into place.  Ask someone to hold it for you, if possible.  Clearly mark and make sure that you can see:  Any grab bars or handrails.  First and last steps.  Where the edge of each step is.  Use tools that help you move around (mobility aids) if they are needed. These include:  Canes.  Walkers.  Scooters.  Crutches.  Turn on the lights when you go into a dark area. Replace any light bulbs as soon as they burn out.  Set up your furniture so you have a clear path. Avoid moving your furniture around.  If any of your floors are uneven, fix them.  If there are any pets around you, be aware of where they are.  Review your medicines with your doctor. Some medicines can make you feel dizzy. This can increase your chance of falling. Ask your doctor what other things that you can do to help prevent falls. This information is not intended to replace advice given to you by your health care provider. Make sure you discuss any questions you have with your health care provider. Document Released: 02/26/2009 Document Revised: 10/08/2015 Document Reviewed: 06/06/2014 Elsevier Interactive Patient Education  2017 [employer].

## 2019-10-22 NOTE — Progress Notes (Signed)
Subjective:   Tracey Carroll is a 79 y.o. female who presents for Medicare Annual (Subsequent) preventive examination.  I connected with Jaleeya today by telephone and verified that I am speaking with the correct person using two identifiers. Location patient: home Location provider: work Persons participating in the virtual visit: patient, Marine scientist.    I discussed the limitations, risks, security and privacy concerns of performing an evaluation and management service by telephone and the availability of in person appointments. I also discussed with the patient that there may be a patient responsible charge related to this service. The patient expressed understanding and verbally consented to this telephonic visit.    Interactive audio and video telecommunications were attempted between this provider and patient, however failed, due to patient having technical difficulties OR patient did not have access to video capability.  We continued and completed visit with audio only.  Some vital signs may be absent or patient reported.   Time Spent with patient on telephone encounter: 25 minutes  Review of Systems:   Cardiac Risk Factors include: dyslipidemia;advanced age (>49men, >32 women);hypertension;sedentary lifestyle;obesity (BMI >30kg/m2)     Objective:     Vitals: There were no vitals taken for this visit.  There is no height or weight on file to calculate BMI.  Advanced Directives 10/22/2019 09/17/2019 03/06/2018 01/17/2017 04/08/2015 11/20/2014  Does Patient Have a Medical Advance Directive? No No No No No No  Does patient want to make changes to medical advance directive? - - - Yes (MAU/Ambulatory/Procedural Areas - Information given) - -  Would patient like information on creating a medical advance directive? No - Patient declined - Yes (MAU/Ambulatory/Procedural Areas - Information given) - No - patient declined information Yes Higher education careers adviser given    Tobacco Social History    Tobacco Use  Smoking Status Never Smoker  Smokeless Tobacco Never Used     Counseling given: Not Answered   Clinical Intake:  Pre-visit preparation completed: No  Pain : 0-10 Pain Score: 2  Pain Type: Chronic pain Pain Location: Back Pain Descriptors / Indicators: Aching Pain Onset: More than a month ago Pain Frequency: Intermittent     Nutritional Status: BMI > 30  Obese Nutritional Risks: None Diabetes: No  How often do you need to have someone help you when you read instructions, pamphlets, or other written materials from your doctor or pharmacy?: 1 - Never  Interpreter Needed?: No  Information entered by :: Caroleen Hamman LPN  Past Medical History:  Diagnosis Date  . Acid reflux 10/19/2014  . Arthritis    arms  . Back pain, chronic 10/19/2014  . Dysphagia 10/20/2014  . Elevated BP 10/20/2014  . Fibroids, intramural 10/19/2014  . FOM (frequency of micturition) 10/19/2014  . GERD (gastroesophageal reflux disease)   . Hyperlipidemia   . Hypertension   . 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 LBP (low back pain) 10/19/2014   Past Surgical History:  Procedure Laterality Date  . ESOPHAGOGASTRODUODENOSCOPY (EGD) WITH PROPOFOL N/A 11/20/2014   Procedure: ESOPHAGOGASTRODUODENOSCOPY (EGD) WITH PROPOFOL with dilation;  Surgeon: Lucilla Lame, MD;  Location: [place];  Service: Endoscopy;  Laterality: N/A;  . ESOPHAGOGASTRODUODENOSCOPY (EGD) WITH PROPOFOL N/A 09/17/2019   Procedure: ESOPHAGOGASTRODUODENOSCOPY (EGD) WITH PROPOFOL;  Surgeon: Virgel Manifold, MD;  Location: [facility];  Service: Endoscopy;  Laterality: N/A;  . TUBAL LIGATION     Family History  Problem Relation Age of Onset  . Diabetes Father        in her 49  and 70s  . Hypertension Daughter        her 75s  . Kidney cancer Neg Hx   . Bladder Cancer Neg Hx    Social History   Socioeconomic History  . Marital status: Single    Spouse name: Not on file  . Number of children: Not on file  . Years  of education: Not on file  . Highest education level: 12th grade  Occupational History  . Occupation: retired  Tobacco Use  . Smoking status: Never Smoker  . Smokeless tobacco: Never Used  Substance and Sexual Activity  . Alcohol use: No    Alcohol/week: 0.0 standard drinks  . Drug use: No  . Sexual activity: Not on file  Other Topics Concern  . Not on file  Social History Narrative   ** Merged History Encounter **       Social Determinants of Health   Financial Resource Strain: Low Risk   . Difficulty of Paying Living Expenses: Not hard at all  Food Insecurity: No Food Insecurity  . Worried About Charity fundraiser in the Last Year: Never true  . Ran Out of Food in the Last Year: Never true  Transportation Needs: No Transportation Needs  . Lack of Transportation (Medical): No  . Lack of Transportation (Non-Medical): No  Physical Activity: Inactive  . Days of Exercise per Week: 0 days  . Minutes of Exercise per Session: 0 min  Stress: No Stress Concern Present  . Feeling of Stress : Not at all  Social Connections: Somewhat Isolated  . Frequency of Communication with Friends and Family: More than three times a week  . Frequency of Social Gatherings with Friends and Family: More than three times a week  . Attends Religious Services: More than 4 times per year  . Active Member of Clubs or Organizations: No  . Attends Archivist Meetings: Never  . Marital Status: Widowed    Outpatient Encounter Medications as of 10/22/2019  Medication Sig  . atorvastatin (LIPITOR) 20 MG tablet TAKE 1 TABLET BY MOUTH ONCE DAILY. APPOINTMENT NEEDED FOR FURTHER REFILLS.  Marland Kitchen conjugated estrogens (PREMARIN) vaginal cream Apply 0.5mg  (pea-sized amount)  just inside the vaginal introitus with a finger-tip on  Monday, Wednesday and Friday nights.  . hydrochlorothiazide (HYDRODIURIL) 12.5 MG tablet Take 1 tablet (12.5 mg total) by mouth daily.  Marland Kitchen losartan (COZAAR) 50 MG tablet Take 1 tablet  (50 mg total) by mouth daily.  Marland Kitchen omeprazole (PRILOSEC) 40 MG capsule Take 1 capsule (40 mg total) by mouth 2 (two) times daily.   No facility-administered encounter medications on file as of 10/22/2019.    Activities of Daily Living In your present state of health, do you have any difficulty performing the following activities: 10/22/2019 07/16/2019  Hearing? N N  Vision? N Y  Difficulty concentrating or making decisions? N N  Walking or climbing stairs? N N  Dressing or bathing? N -  Doing errands, shopping? N N  Preparing Food and eating ? N -  Using the Toilet? N -  In the past six months, have you accidently leaked urine? N -  Do you have problems with loss of bowel control? N -  Managing your Medications? N -  Managing your Finances? N -  Housekeeping or managing your Housekeeping? N -  Some recent data might be hidden    Patient Care Team: Malfi, Lupita Raider, FNP as PCP - General (Family Medicine) Vanita Ingles, RN as Case  Manager (General Practice)    Assessment:   This is a routine wellness examination for Jamala.  Exercise Activities and Dietary recommendations Current Exercise Habits: The patient does not participate in regular exercise at present, Exercise limited by: None identified  Goals Addressed   None     Fall Risk: Fall Risk  10/22/2019 04/17/2019 03/05/2019 09/12/2018 03/12/2018  Falls in the past year? 1 0 0 0 No  Number falls in past yr: 0 0 0 - -  Injury with Fall? 0 - - - -  Risk for fall due to : - - - - -  Follow up Education provided;Falls evaluation completed Falls evaluation completed Falls evaluation completed Falls evaluation completed -    FALL RISK PREVENTION PERTAINING TO THE HOME:  Any stairs in or around the home? Yes  If so, are there any without handrails? Yes   Home free of loose throw rugs in walkways, pet beds, electrical cords, etc? Yes  Adequate lighting in your home to reduce risk of falls? Yes   ASSISTIVE DEVICES UTILIZED TO  PREVENT FALLS:  Life alert? No  Use of a cane, walker or w/c? No  Grab bars in the bathroom? No  Shower chair or bench in shower? No  Elevated toilet seat or a handicapped toilet? No   DME ORDERS:  DME order needed?  No   TIMED UP AND GO:  Was the test performed? No . Virtual Visit    Depression Screen PHQ 2/9 Scores 10/22/2019 04/17/2019 09/12/2018 03/12/2018  PHQ - 2 Score 0 0 0 0     Cognitive Function     6CIT Screen 10/22/2019 03/06/2018 01/17/2017  What Year? 0 points 0 points 0 points  What month? 0 points 0 points 0 points  What time? 0 points 0 points 0 points  Count back from 20 0 points 0 points 0 points  Months in reverse 4 points 0 points 0 points  Repeat phrase 2 points 0 points 2 points  Total Score 6 0 2    Immunization History  Administered Date(s) Administered  . Influenza, High Dose Seasonal PF 01/22/2015, 03/21/2016, 01/17/2017, 03/06/2018, 02/12/2019  . Influenza-Unspecified 04/14/2014, 04/14/2014  . Pneumococcal Conjugate-13 12/18/2013  . Pneumococcal Polysaccharide-23 04/16/2012, 12/19/2014  . Tdap 08/08/2013, 08/08/2013    Qualifies for Shingles Vaccine? Yes  . Due for Shingrix. Education has been provided regarding the importance of this vaccine. Pt has been advised to call insurance company to determine out of pocket expense. Advised may also receive vaccine at local pharmacy or Health Dept. Verbalized acceptance and understanding.  Tdap:Up to date  Flu Vaccine: Due 01/2020  Pneumococcal Vaccine: Completed  Covid-19 Vaccine: Declined  Screening Tests Health Maintenance  Topic Date Due  . Hepatitis C Screening  Never done  . COVID-19 Vaccine (1) Never done  . INFLUENZA VACCINE  12/15/2019  . TETANUS/TDAP  08/09/2023  . DEXA SCAN  Completed  . PNA vac Low Risk Adult  Completed    Cancer Screenings:  Colorectal Screening: Completed 12/19/2014. Repeat every 10 years.  Mammogram.  Ordered today. Pt provided with contact info and  advised to call to schedule appt. Pt aware the office will call re: appt.  Bone Density: Completed 01/22/2015.  Lung Cancer Screening: (Low Dose CT Chest recommended if Age 39-80 years, 30 pack-year currently smoking OR have quit w/in 15years.) does not qualify.    Additional Screening:  Hepatitis C Screening: Discuss with PCP  Vision Screening: Recommended annual ophthalmology exams for early detection  of glaucoma and other disorders of the eye. Is the patient up to date with their annual eye exam?  Yes  Who is the provider or what is the name of the office in which the pt attends annual eye exams? [place] Screening: Recommended annual dental exams for proper oral hygiene  Community Resource Referral:  CRR required this visit?  No       Plan:  I have personally reviewed and addressed the Medicare Annual Wellness questionnaire and have noted the following in the patient's chart:  A. Medical and social history B. Use of alcohol, tobacco or illicit drugs  C. Current medications and supplements D. Functional ability and status E.  Nutritional status F.  Physical activity G. Advance directives H. List of other physicians I.  Hospitalizations, surgeries, and ER visits in previous 12 months J.  [place] such as hearing and vision if needed, cognitive and depression L. Referrals and appointments   In addition, I have reviewed and discussed with patient certain preventive protocols, quality metrics, and best practice recommendations. A written personalized care plan for preventive services as well as general preventive health recommendations were provided to patient.  Due to this being a telephonic visit, the after visit summary with patients personalized plan was offered to patient via mail or my-chart. Patient declined at this time.  Signed,    Marta Antu, LPN  12/15/5001 Nurse Health Advisor   Nurse Notes: Mammogram ordered

## 2019-10-28 ENCOUNTER — Telehealth: Payer: Self-pay | Admitting: General Practice

## 2019-10-28 ENCOUNTER — Other Ambulatory Visit: Payer: Self-pay | Admitting: Family Medicine

## 2019-10-28 ENCOUNTER — Ambulatory Visit (INDEPENDENT_AMBULATORY_CARE_PROVIDER_SITE_OTHER): Payer: Medicare Other | Admitting: General Practice

## 2019-10-28 DIAGNOSIS — R1319 Other dysphagia: Secondary | ICD-10-CM

## 2019-10-28 DIAGNOSIS — I10 Essential (primary) hypertension: Secondary | ICD-10-CM

## 2019-10-28 DIAGNOSIS — E782 Mixed hyperlipidemia: Secondary | ICD-10-CM | POA: Diagnosis not present

## 2019-10-28 DIAGNOSIS — K219 Gastro-esophageal reflux disease without esophagitis: Secondary | ICD-10-CM

## 2019-10-28 NOTE — Chronic Care Management (AMB) (Signed)
Chronic Care Management   Follow Up Note   10/28/2019 Name: ALVERTA CACCAMO MRN: [id number] DOB: [birth year]  Referred by: Verl Bangs, FNP Reason for referral : Chronic Care Management (follow up: HTN/HLD/dysphagia/atherosclerosis and new concerns)   Tracey Carroll is a 79 y.o. year old female who is a primary care patient of Verl Bangs, FNP. The CCM team was consulted for assistance with chronic disease management and care coordination needs.    Review of patient status, including review of consultants reports, relevant laboratory and other test results, and collaboration with appropriate care team members and the patient's provider was performed as part of comprehensive patient evaluation and provision of chronic care management services.    SDOH (Social Determinants of Health) assessments performed: No See Care Plan activities for detailed interventions related to [facility])     Outpatient Encounter Medications as of 10/28/2019  Medication Sig  . atorvastatin (LIPITOR) 20 MG tablet TAKE 1 TABLET BY MOUTH ONCE DAILY. APPOINTMENT NEEDED FOR FURTHER REFILLS.  Marland Kitchen conjugated estrogens (PREMARIN) vaginal cream Apply 0.5mg  (pea-sized amount)  just inside the vaginal introitus with a finger-tip on  Monday, Wednesday and Friday nights.  . hydrochlorothiazide (HYDRODIURIL) 12.5 MG tablet Take 1 tablet (12.5 mg total) by mouth daily.  Marland Kitchen losartan (COZAAR) 50 MG tablet Take 1 tablet (50 mg total) by mouth daily.  Marland Kitchen omeprazole (PRILOSEC) 40 MG capsule Take 1 capsule (40 mg total) by mouth 2 (two) times daily.   No facility-administered encounter medications on file as of 10/28/2019.     Objective:  BP Readings from Last 3 Encounters:  10/24/19 120/76  10/18/19 124/71  10/02/19 120/82    Goals Addressed            This Visit's Progress   . RNCM: I am have difficulty swallowing       Current Barriers:  Marland Kitchen Knowledge Deficits related to swallowing difficulties and how to obtain care     Nurse Case Manager Clinical Goal(s):  Marland Kitchen Over the next 120 days, patient will verbalize understanding of plan for seeing the new NP at [facility] for an appointment to evaluate and recommend treatment for issues the patient is having with swallowing- completed- the patient sees a specialist on May 4th . Over the next 90 days, patient will work with [employer] and pcp to address needs related to swallowing problems and referrals as needed to evaluate and treat swallowing issues  . Over the next 90 days, patient will attend all scheduled medical appointments: staff to call and make an appointment with the pcp to be seen- completed  . Over the next 90 days, the patient will demonstrate ongoing self health care management ability as evidenced by improved swallowing and no problems related to ineffective airway clearance   Interventions:  . Evaluation of current treatment plan related to GERD and patient's adherence to plan as established by provider. . Provided education to patient re: to process of obtaining appointment with pcp for evaluation, recommended the patient write down questions to ask the pcp. The patient has had her esophagus stretched x 2 in the past- completed. The patient has an upcoming appointment with a specialist for upcoming evaluation and treatment. 10-28-2019: the patient has an appointment on 11-21-2019 in [place] to see the specialist about her dysphagia  . Reviewed medications with patient and discussed compliance.  Patient endorses compliance and denies any concerns with medications. No new medications prescribed at last pcp visit on 10-18-2019.   Marland Kitchen Discussed  plans with patient for ongoing care management follow up and provided patient with direct contact information for care management team . Provided patient with GERD educational materials related to swallowing difficulties  . Reviewed scheduled/upcoming provider appointments including: Patient has an appointment with the specialist on May  12th for evaluation and further treatment options. 10-28-2019: the patient will see specialist on 11/21/2019 for evaluation and further treatment options. This appointment is in [place].   Patient Self Care Activities:  . Patient verbalizes understanding of plan to work with the CCM team to meet her health and wellness needs  . Self administers medications as prescribed . Attends all scheduled provider appointments . Calls provider office for new concerns or questions . Unable to independently manage GERD signs and symptoms as evidence by difficulty swallowing   Please see past updates related to this goal by clicking on the "Past Updates" button in the selected goal      . RNCM: My blood pressure and cholesterol is better since I take medications       Current Barriers:  . Chronic Disease Management support, education, and care coordination needs related to HTN and HLD  Clinical Goal(s) related to HTN and HLD:  Over the next 120 days, patient will:  . Work with the care management team to address educational, disease management, and care coordination needs  . Begin or continue self health monitoring activities as directed today Measure and record blood pressure 5 times per week . Call provider office for new or worsened signs and symptoms Blood pressure findings outside established parameters and New or worsened symptom related to HLD and other Chronic disease processes  . Call care management team with questions or concerns . Verbalize basic understanding of patient centered plan of care established today  Interventions related to HTN and HLD:  . Evaluation of current treatment plans and patient's adherence to plan as established by provider . Assessed patient understanding of disease states: Review of Chronic condtions and the patient states she feels she is doing well at this time. Has several upcoming appointments  . Assessed patient's education and care coordination needs: The  patient states that she is still watching her diet.  Still has the swallowing issus but they are not worse. Will see specialist soon. Confirmed the patient has an eye exam on May 4th. 10-28-2019: The patient will see the specialist for dysphagia on 11-21-2019.  Marland Kitchen Provided disease specific education to patient: Education on checking blood pressure, adhering to heart Healthy diet and calling provider for changes in condition. The patient is checking blood pressure regularly- last reading 10-24-2019 was 120/76.  The patient endorses heart healthy diet and watching sodium content.  Nash Dimmer with appropriate clinical care team members regarding patient needs  Patient Self Care Activities related to HTN and HLD:  . Patient is unable to independently self-manage chronic health conditions  Please see past updates related to this goal by clicking on the "Past Updates" button in the selected goal          Plan:   The care management team will reach out to the patient again over the next 60 days.    Noreene Larsson RN, MSN, [place] Mobile: [phone]

## 2019-10-28 NOTE — Patient Instructions (Signed)
Visit Information  Goals Addressed            This Visit's Progress   . RNCM: I am have difficulty swallowing       Current Barriers:  Marland Kitchen Knowledge Deficits related to swallowing difficulties and how to obtain care   Nurse Case Manager Clinical Goal(s):  Marland Kitchen Over the next 120 days, patient will verbalize understanding of plan for seeing the new NP at [facility] for an appointment to evaluate and recommend treatment for issues the patient is having with swallowing- completed- the patient sees a specialist on May 4th . Over the next 90 days, patient will work with [facility] and pcp to address needs related to swallowing problems and referrals as needed to evaluate and treat swallowing issues  . Over the next 90 days, patient will attend all scheduled medical appointments: staff to call and make an appointment with the pcp to be seen- completed  . Over the next 90 days, the patient will demonstrate ongoing self health care management ability as evidenced by improved swallowing and no problems related to ineffective airway clearance   Interventions:  . Evaluation of current treatment plan related to GERD and patient's adherence to plan as established by provider. . Provided education to patient re: to process of obtaining appointment with pcp for evaluation, recommended the patient write down questions to ask the pcp. The patient has had her esophagus stretched x 2 in the past- completed. The patient has an upcoming appointment with a specialist for upcoming evaluation and treatment. 10-28-2019: the patient has an appointment on 11-21-2019 in [place] to see the specialist about her dysphagia  . Reviewed medications with patient and discussed compliance.  Patient endorses compliance and denies any concerns with medications. No new medications prescribed at last pcp visit on 10-18-2019.   Marland Kitchen Discussed plans with patient for ongoing care management follow up and provided patient with direct contact information for  care management team . Provided patient with GERD educational materials related to swallowing difficulties  . Reviewed scheduled/upcoming provider appointments including: Patient has an appointment with the specialist on May 12th for evaluation and further treatment options. 10-28-2019: the patient will see specialist on 11/21/2019 for evaluation and further treatment options. This appointment is in [place].   Patient Self Care Activities:  . Patient verbalizes understanding of plan to work with the CCM team to meet her health and wellness needs  . Self administers medications as prescribed . Attends all scheduled provider appointments . Calls provider office for new concerns or questions . Unable to independently manage GERD signs and symptoms as evidence by difficulty swallowing   Please see past updates related to this goal by clicking on the "Past Updates" button in the selected goal      . RNCM: My blood pressure and cholesterol is better since I take medications       Current Barriers:  . Chronic Disease Management support, education, and care coordination needs related to HTN and HLD  Clinical Goal(s) related to HTN and HLD:  Over the next 120 days, patient will:  . Work with the care management team to address educational, disease management, and care coordination needs  . Begin or continue self health monitoring activities as directed today Measure and record blood pressure 5 times per week . Call provider office for new or worsened signs and symptoms Blood pressure findings outside established parameters and New or worsened symptom related to HLD and other Chronic disease processes  .  Call care management team with questions or concerns . Verbalize basic understanding of patient centered plan of care established today  Interventions related to HTN and HLD:  . Evaluation of current treatment plans and patient's adherence to plan as established by provider . Assessed patient  understanding of disease states: Review of Chronic condtions and the patient states she feels she is doing well at this time. Has several upcoming appointments  . Assessed patient's education and care coordination needs: The patient states that she is still watching her diet.  Still has the swallowing issus but they are not worse. Will see specialist soon. Confirmed the patient has an eye exam on May 4th. 10-28-2019: The patient will see the specialist for dysphagia on 11-21-2019.  Marland Kitchen Provided disease specific education to patient: Education on checking blood pressure, adhering to heart Healthy diet and calling provider for changes in condition. The patient is checking blood pressure regularly- last reading 10-24-2019 was 120/76.  The patient endorses heart healthy diet and watching sodium content.  Nash Dimmer with appropriate clinical care team members regarding patient needs  Patient Self Care Activities related to HTN and HLD:  . Patient is unable to independently self-manage chronic health conditions  Please see past updates related to this goal by clicking on the "Past Updates" button in the selected goal         Patient verbalizes understanding of instructions provided today.   The care management team will reach out to the patient again over the next 60 days.   Noreene Larsson RN, MSN, [place] Mobile: [phone]

## 2019-11-06 ENCOUNTER — Other Ambulatory Visit: Payer: Self-pay | Admitting: Family Medicine

## 2019-11-06 NOTE — Telephone Encounter (Signed)
Caller name: Johnella Moloney  Relation to pt: from [facility]  Call back number: [phone] ext [phone]   Pharmacy: [facility] [place] Phone:  [phone]  Fax:  [phone]        Reason for call:  Patient in need of clarity regarding the frequency for atorvastatin (LIPITOR) patient states she has been taking 1 tablet every other day but her insurance states the prescription states 1x daily. Patient in need of clarity and requesting a new script.

## 2019-11-06 NOTE — Telephone Encounter (Signed)
Patient may be taking medication differently from Rx- all recent notes in chart are daily- sent for PCP review

## 2019-11-07 ENCOUNTER — Other Ambulatory Visit: Payer: Self-pay | Admitting: Family Medicine

## 2019-11-07 DIAGNOSIS — E7800 Pure hypercholesterolemia, unspecified: Secondary | ICD-10-CM

## 2019-11-07 MED ORDER — ATORVASTATIN CALCIUM 20 MG PO TABS: ORAL_TABLET | ORAL | 1 refills | Status: DC

## 2019-11-07 NOTE — Telephone Encounter (Signed)
Can take the prescription every other day.  I can send in a new prescription.

## 2019-11-08 NOTE — Telephone Encounter (Signed)
The pt was notified, no questions or concerns. 

## 2019-12-03 ENCOUNTER — Ambulatory Visit: Payer: Medicare Other | Admitting: Gastroenterology

## 2019-12-16 ENCOUNTER — Telehealth: Payer: Self-pay

## 2019-12-16 ENCOUNTER — Telehealth: Payer: Self-pay | Admitting: General Practice

## 2019-12-16 NOTE — Telephone Encounter (Signed)
  Chronic Care Management   Outreach Note  12/16/2019 Name: [name] MRN: [id number] DOB: January 03, 1941  Referred by: Verl Bangs, FNP Reason for referral : Appointment [facility] Outreach follow up appointment for Chronic Disease managment and Care coordination needs)   An unsuccessful telephone outreach was attempted today. The patient was referred to the case management team for assistance with care management and care coordination.   Follow Up Plan: The care management team will reach out to the patient again over the next 30 to 60 days.   Noreene Larsson RN, MSN, [place] Mobile: [phone]

## 2019-12-17 ENCOUNTER — Other Ambulatory Visit: Payer: Self-pay | Admitting: Family Medicine

## 2019-12-17 ENCOUNTER — Telehealth: Payer: Self-pay | Admitting: *Deleted

## 2019-12-17 DIAGNOSIS — I10 Essential (primary) hypertension: Secondary | ICD-10-CM

## 2019-12-17 NOTE — Telephone Encounter (Signed)
Hey patient is rescheduled and aware

## 2019-12-17 NOTE — Chronic Care Management (AMB) (Signed)
  Chronic Care Management   Note  12/17/2019 Name: Tracey Carroll MRN: [id number] DOB: 27-May-1940  Tracey Carroll is a 79 y.o. year old female who is a primary care patient of Lorine Bears, Lupita Raider, FNP and is actively engaged with the care management team. I reached out to [name] by phone today to assist with re-scheduling a follow up visit with the RN Case Manager.  Follow up plan: Telephone appointment with care management team member scheduled for: 01/27/2020  [facility]  [place],  [id number] Direct Dial: [email or web address] Website: [email or web address]

## 2019-12-31 ENCOUNTER — Other Ambulatory Visit: Payer: Self-pay | Admitting: Family Medicine

## 2019-12-31 DIAGNOSIS — I10 Essential (primary) hypertension: Secondary | ICD-10-CM

## 2019-12-31 MED ORDER — LOSARTAN POTASSIUM 50 MG PO TABS: 50.0000 mg | ORAL_TABLET | Freq: Every day | ORAL | 1 refills | Status: DC

## 2019-12-31 NOTE — Telephone Encounter (Signed)
Requested Prescriptions  Pending Prescriptions Disp Refills   losartan (COZAAR) 50 MG tablet 90 tablet 1    Sig: Take 1 tablet (50 mg total) by mouth daily.     Cardiovascular:  Angiotensin Receptor Blockers Passed - 12/31/2019  9:13 AM      Passed - Cr in normal range and within 180 days    Creat  Date Value Ref Range Status  08/30/2019 0.72 0.60 - 0.93 mg/dL Final    Comment:    For patients >77 years of age, the reference limit for Creatinine is approximately 13% higher for people identified as African-American. .          Passed - K in normal range and within 180 days    Potassium  Date Value Ref Range Status  08/30/2019 3.7 3.5 - 5.3 mmol/L Final         Passed - Patient is not pregnant      Passed - Last BP in normal range    BP Readings from Last 1 Encounters:  10/24/19 120/76         Passed - Valid encounter within last 6 months    Recent Outpatient Visits          2 months ago Essential hypertension   [facility], Lupita Raider, FNP   5 months ago Obesity, morbid, BMI 40.0-49.9 [facility])   [place], FNP   8 months ago Mixed hyperlipidemia   [place], DO   10 months ago Dysphagia, unspecified type   [facility] Merrilyn Puma, Jerrel Ivory, NP   1 year ago Essential hypertension   [place], DO      Future Appointments            In 3 months [place], Lupita Raider, [facility], [place]   In 3 months [place], Shannon A, [place]   In 10 months  [facility], [facility]

## 2019-12-31 NOTE — Telephone Encounter (Signed)
Medication Refill - Medication: losartan (COZAAR) 50 MG tablet    Has the patient contacted their pharmacy? No. (Agent: If no, request that the patient contact the pharmacy for the refill.) (Agent: If yes, when and what did the pharmacy advise?)  Preferred Pharmacy (with phone number or street name):  [facility] [place] Phone:  [phone]  Fax:  [phone]       Agent: Please be advised that RX refills may take up to 3 business days. We ask that you follow-up with your pharmacy.

## 2020-01-27 ENCOUNTER — Telehealth: Payer: Medicare Other

## 2020-02-24 ENCOUNTER — Ambulatory Visit (INDEPENDENT_AMBULATORY_CARE_PROVIDER_SITE_OTHER): Payer: Medicare Other | Admitting: General Practice

## 2020-02-24 ENCOUNTER — Other Ambulatory Visit: Payer: Self-pay | Admitting: Family Medicine

## 2020-02-24 ENCOUNTER — Telehealth: Payer: Medicare Other | Admitting: General Practice

## 2020-02-24 DIAGNOSIS — K219 Gastro-esophageal reflux disease without esophagitis: Secondary | ICD-10-CM

## 2020-02-24 DIAGNOSIS — I10 Essential (primary) hypertension: Secondary | ICD-10-CM

## 2020-02-24 DIAGNOSIS — E7800 Pure hypercholesterolemia, unspecified: Secondary | ICD-10-CM

## 2020-02-24 DIAGNOSIS — R1319 Other dysphagia: Secondary | ICD-10-CM

## 2020-02-24 MED ORDER — OMEPRAZOLE 40 MG PO CPDR: 40.0000 mg | DELAYED_RELEASE_CAPSULE | Freq: Two times a day (BID) | ORAL | 1 refills | Status: DC

## 2020-02-24 NOTE — Patient Instructions (Signed)
Visit Information  Goals Addressed            This Visit's Progress   . RNCM: I am have difficulty swallowing       Current Barriers:  Marland Kitchen Knowledge Deficits related to swallowing difficulties and how to obtain care for dysphagia  Nurse Case Manager Clinical Goal(s):  Marland Kitchen Over the next 120 days, patient will verbalize understanding of plan for seeing the new NP at [facility] for an appointment to evaluate and recommend treatment for issues the patient is having with swallowing- completed- the patient sees a specialist on May 4th . Over the next 90 days, patient will work with [facility] and pcp to address needs related to swallowing problems and referrals as needed to evaluate and treat swallowing issues  . Over the next 90 days, patient will attend all scheduled medical appointments: staff to call and make an appointment with the pcp to be seen- completed  . Over the next 90 days, the patient will demonstrate ongoing self health care management ability as evidenced by improved swallowing and no problems related to ineffective airway clearance   Interventions:  . Evaluation of current treatment plan related to GERD and patient's adherence to plan as established by provider. 02-24-2020: The patient is going on 04-02-2020 to specialist for evaluation of dysphagia and GERD. The patient states that they tried to do the light but was having a hard time with getting it to pass through.  They told her she did not need her esophagus stretched however she feels like they need to do something to help her.  She is being careful with eating and making sure she chews her food up good so she will not get choked.  . Provided education to patient re: to process of obtaining appointment with pcp for evaluation, recommended the patient write down questions to ask the pcp. The patient has had her esophagus stretched x 2 in the past- completed. The patient has an upcoming appointment with a specialist for upcoming evaluation and  treatment. 02-24-2020: the patient has an appointment on 04-02-2020 in [place] to see the specialist about her dysphagia  . Reviewed medications with patient and discussed compliance.  Patient endorses compliance and denies any concerns with medications. 02-24-2020: The patient is almost out of her omeprazole and ask if the pcp could call in a refill for omeprazole 40 mg.  Will send the pcp an in basket message and ask for assistance.   . Discussed plans with patient for ongoing care management follow up and provided patient with direct contact information for care management team . Provided patient with GERD educational materials related to swallowing difficulties  . Reviewed scheduled/upcoming provider appointments including: Patient has an appointment with the specialist on 04-02-2020 for evaluation and further treatment options, the evaluation is in [facility] with the specialist.   Patient Self Care Activities:  . Patient verbalizes understanding of plan to work with the CCM team to meet her health and wellness needs  . Self administers medications as prescribed . Attends all scheduled provider appointments . Calls provider office for new concerns or questions . Unable to independently manage GERD signs and symptoms as evidence by difficulty swallowing   Please see past updates related to this goal by clicking on the "Past Updates" button in the selected goal      . RNCM: My blood pressure and cholesterol is better since I take medications       Current Barriers:  . Chronic Disease  Management support, education, and care coordination needs related to HTN and HLD  Clinical Goal(s) related to HTN and HLD:  Over the next 120 days, patient will:  . Work with the care management team to address educational, disease management, and care coordination needs  . Begin or continue self health monitoring activities as directed today Measure and record blood pressure 5 times per week . Call  provider office for new or worsened signs and symptoms Blood pressure findings outside established parameters and New or worsened symptom related to HLD and other Chronic disease processes  . Call care management team with questions or concerns . Verbalize basic understanding of patient centered plan of care established today  Interventions related to HTN and HLD:  . Evaluation of current treatment plans and patient's adherence to plan as established by provider.  The patient states she is doing well with managing her blood pressure and cholesterol.  . Assessed patient understanding of disease states: Review of Chronic condtions and the patient states she feels she is doing well at this time. Has several upcoming appointments  . Assessed patient's education and care coordination needs: The patient states that she is still watching her diet.  Still has the swallowing issus but they are not worse. Will see specialist soon. Confirmed the patient has an eye exam on May 4th. Upcoming appointment on 04-17-2020 at 0800 am. . Provided disease specific education to patient: Education on checking blood pressure, adhering to heart Healthy diet and calling provider for changes in condition. The patient is checking blood pressure regularly- last reading 123/80.  The patient endorses heart healthy diet and watching sodium content.  Nash Dimmer with appropriate clinical care team members regarding patient needs.  Knows CCM team is here to help with health and wellness needs.   Patient Self Care Activities related to HTN and HLD:  . Patient is unable to independently self-manage chronic health conditions  Please see past updates related to this goal by clicking on the "Past Updates" button in the selected goal      . COMPLETED: RNCM: Nobody told me what to do about that shot (COVID19)       Current Barriers: The patient has had both of her vaccinations. The patient received Moderna at [place] and 1st was 12-30-2019  and 2nd was 01-28-2020. Goal completed.  . Knowledge Deficits related to COVID-19 and impact on patient self health management  Clinical Goal(s):  Marland Kitchen Over the next 30 days, patient will verbalize basic understanding of COVID-19 impact on individual health and self health management as evidenced by verbalization of basic understanding of COVID-19 as a viral disease, measures to prevent exposure, signs and symptoms, when to contact provider  Interventions: . Evaluation of current treatment plan related to [place] and patient's adherence to plan as established by provider. . Advised patient to discuss her questions and concerns about the COVID19 vaccine with her pcp . Provided patient with [place] educational materials related to vaccination and how to obtain  Patient Self Care Activities:  . Patient verbalizes understanding of plan to discuss the COVID19 vaccine at new pcp visit  . Self administers medications as prescribed . Attends all scheduled provider appointments . Calls provider office for new concerns or questions  Please see past updates related to this goal by clicking on the "Past Updates" button in the selected goal                       Patient verbalizes  understanding of instructions provided today.   Telephone follow up appointment with care management team member scheduled for: 04-27-2020 2:30 pm  Noreene Larsson RN, MSN, [place] Mobile: [phone]

## 2020-02-24 NOTE — Chronic Care Management (AMB) (Signed)
Chronic Care Management   Follow Up Note   02/24/2020 Name: Tracey Carroll MRN: [id number] DOB: 05-07-1941  Referred by: Verl Bangs, FNP Reason for referral : No chief complaint on file.   Tracey Carroll is a 79 y.o. year old female who is a primary care patient of Tracey Carroll, Tracey Raider, FNP. The CCM team was consulted for assistance with chronic disease management and care coordination needs.    Review of patient status, including review of consultants reports, relevant laboratory and other test results, and collaboration with appropriate care team members and the patient's provider was performed as part of comprehensive patient evaluation and provision of chronic care management services.    SDOH (Social Determinants of Health) assessments performed: Yes See Care Plan activities for detailed interventions related to [facility])     Outpatient Encounter Medications as of 02/24/2020  Medication Sig  . atorvastatin (LIPITOR) 20 MG tablet TAKE 1 TABLET BY MOUTH EVERY OTHER DAY  . conjugated estrogens (PREMARIN) vaginal cream Apply 0.5mg  (pea-sized amount)  just inside the vaginal introitus with a finger-tip on  Monday, Wednesday and Friday nights.  . hydrochlorothiazide (HYDRODIURIL) 12.5 MG tablet Take 1 tablet by mouth once daily  . losartan (COZAAR) 50 MG tablet Take 1 tablet (50 mg total) by mouth daily.  Marland Kitchen omeprazole (PRILOSEC) 40 MG capsule Take 1 capsule (40 mg total) by mouth 2 (two) times daily.   No facility-administered encounter medications on file as of 02/24/2020.     Objective:   Goals Addressed            This Visit's Progress   . RNCM: I am have difficulty swallowing       Current Barriers:  Marland Kitchen Knowledge Deficits related to swallowing difficulties and how to obtain care for dysphagia  Nurse Case Manager Clinical Goal(s):  Marland Kitchen Over the next 120 days, patient will verbalize understanding of plan for seeing the new NP at [facility] for an appointment to evaluate and  recommend treatment for issues the patient is having with swallowing- completed- the patient sees a specialist on May 4th . Over the next 90 days, patient will work with [facility] and pcp to address needs related to swallowing problems and referrals as needed to evaluate and treat swallowing issues  . Over the next 90 days, patient will attend all scheduled medical appointments: staff to call and make an appointment with the pcp to be seen- completed  . Over the next 90 days, the patient will demonstrate ongoing self health care management ability as evidenced by improved swallowing and no problems related to ineffective airway clearance   Interventions:  . Evaluation of current treatment plan related to GERD and patient's adherence to plan as established by provider. 02-24-2020: The patient is going on 04-02-2020 to specialist for evaluation of dysphagia and GERD. The patient states that they tried to do the light but was having a hard time with getting it to pass through.  They told her she did not need her esophagus stretched however she feels like they need to do something to help her.  She is being careful with eating and making sure she chews her food up good so she will not get choked.  . Provided education to patient re: to process of obtaining appointment with pcp for evaluation, recommended the patient write down questions to ask the pcp. The patient has had her esophagus stretched x 2 in the past- completed. The patient has an upcoming appointment with a specialist  for upcoming evaluation and treatment. 02-24-2020: the patient has an appointment on 04-02-2020 in [place] to see the specialist about her dysphagia  . Reviewed medications with patient and discussed compliance.  Patient endorses compliance and denies any concerns with medications. 02-24-2020: The patient is almost out of her omeprazole and ask if the pcp could call in a refill for omeprazole 40 mg.  Will send the pcp an in basket  message and ask for assistance.   . Discussed plans with patient for ongoing care management follow up and provided patient with direct contact information for care management team . Provided patient with GERD educational materials related to swallowing difficulties  . Reviewed scheduled/upcoming provider appointments including: Patient has an appointment with the specialist on 04-02-2020 for evaluation and further treatment options, the evaluation is in [facility] with the specialist.   Patient Self Care Activities:  . Patient verbalizes understanding of plan to work with the CCM team to meet her health and wellness needs  . Self administers medications as prescribed . Attends all scheduled provider appointments . Calls provider office for new concerns or questions . Unable to independently manage GERD signs and symptoms as evidence by difficulty swallowing   Please see past updates related to this goal by clicking on the "Past Updates" button in the selected goal      . RNCM: My blood pressure and cholesterol is better since I take medications       Current Barriers:  . Chronic Disease Management support, education, and care coordination needs related to HTN and HLD  Clinical Goal(s) related to HTN and HLD:  Over the next 120 days, patient will:  . Work with the care management team to address educational, disease management, and care coordination needs  . Begin or continue self health monitoring activities as directed today Measure and record blood pressure 5 times per week . Call provider office for new or worsened signs and symptoms Blood pressure findings outside established parameters and New or worsened symptom related to HLD and other Chronic disease processes  . Call care management team with questions or concerns . Verbalize basic understanding of patient centered plan of care established today  Interventions related to HTN and HLD:  . Evaluation of current treatment plans and  patient's adherence to plan as established by provider.  The patient states she is doing well with managing her blood pressure and cholesterol.  . Assessed patient understanding of disease states: Review of Chronic condtions and the patient states she feels she is doing well at this time. Has several upcoming appointments  . Assessed patient's education and care coordination needs: The patient states that she is still watching her diet.  Still has the swallowing issus but they are not worse. Will see specialist soon. Confirmed the patient has an eye exam on May 4th. Upcoming appointment on 04-17-2020 at 0800 am. . Provided disease specific education to patient: Education on checking blood pressure, adhering to heart Healthy diet and calling provider for changes in condition. The patient is checking blood pressure regularly- last reading 123/80.  The patient endorses heart healthy diet and watching sodium content.  Nash Dimmer with appropriate clinical care team members regarding patient needs.  Knows CCM team is here to help with health and wellness needs.   Patient Self Care Activities related to HTN and HLD:  . Patient is unable to independently self-manage chronic health conditions  Please see past updates related to this goal by clicking on the "  Past Updates" button in the selected goal      . COMPLETED: RNCM: Nobody told me what to do about that shot (COVID19)       Current Barriers: The patient has had both of her vaccinations. The patient received Moderna at [place] and 1st was 12-30-2019 and 2nd was 01-28-2020. Goal completed.  . Knowledge Deficits related to COVID-19 and impact on patient self health management  Clinical Goal(s):  Marland Kitchen Over the next 30 days, patient will verbalize basic understanding of COVID-19 impact on individual health and self health management as evidenced by verbalization of basic understanding of COVID-19 as a viral disease, measures to prevent exposure, signs and symptoms,  when to contact provider  Interventions: . Evaluation of current treatment plan related to [place] and patient's adherence to plan as established by provider. . Advised patient to discuss her questions and concerns about the COVID19 vaccine with her pcp . Provided patient with [place] educational materials related to vaccination and how to obtain  Patient Self Care Activities:  . Patient verbalizes understanding of plan to discuss the COVID19 vaccine at new pcp visit  . Self administers medications as prescribed . Attends all scheduled provider appointments . Calls provider office for new concerns or questions  Please see past updates related to this goal by clicking on the "Past Updates" button in the selected goal                        Plan:   Telephone follow up appointment with care management team member scheduled for: 04-27-2020 at 73PM   [place], MSN, [facility] Mobile: [phone]

## 2020-04-02 ENCOUNTER — Other Ambulatory Visit: Payer: Self-pay | Admitting: Family Medicine

## 2020-04-02 DIAGNOSIS — I10 Essential (primary) hypertension: Secondary | ICD-10-CM

## 2020-04-17 ENCOUNTER — Other Ambulatory Visit: Payer: Self-pay

## 2020-04-17 ENCOUNTER — Ambulatory Visit (INDEPENDENT_AMBULATORY_CARE_PROVIDER_SITE_OTHER): Payer: Medicare Other | Admitting: Family Medicine

## 2020-04-17 ENCOUNTER — Encounter: Payer: Self-pay | Admitting: Family Medicine

## 2020-04-17 VITALS — BP 130/74 | HR 74 | Temp 98.2°F | Resp 17 | Ht 59.0 in | Wt 187.0 lb

## 2020-04-17 DIAGNOSIS — E7800 Pure hypercholesterolemia, unspecified: Secondary | ICD-10-CM

## 2020-04-17 DIAGNOSIS — K219 Gastro-esophageal reflux disease without esophagitis: Secondary | ICD-10-CM

## 2020-04-17 DIAGNOSIS — I10 Essential (primary) hypertension: Secondary | ICD-10-CM

## 2020-04-17 MED ORDER — OMEPRAZOLE 40 MG PO CPDR: 40.0000 mg | DELAYED_RELEASE_CAPSULE | Freq: Two times a day (BID) | ORAL | 1 refills | Status: DC

## 2020-04-17 MED ORDER — ATORVASTATIN CALCIUM 20 MG PO TABS: ORAL_TABLET | ORAL | 1 refills | Status: DC

## 2020-04-17 MED ORDER — LOSARTAN POTASSIUM 50 MG PO TABS: 50.0000 mg | ORAL_TABLET | Freq: Every day | ORAL | 1 refills | Status: DC

## 2020-04-17 MED ORDER — HYDROCHLOROTHIAZIDE 12.5 MG PO TABS: 12.5000 mg | ORAL_TABLET | Freq: Every day | ORAL | 1 refills | Status: DC

## 2020-04-17 NOTE — Progress Notes (Signed)
Subjective:    Patient ID: Tracey Carroll, female    DOB: Mar 16, 1941, 79 y.o.   MRN: [id number]  Tracey Carroll is a 79 y.o. female presenting on 04/17/2020 for Hypertension   HPI   Tracey Carroll presents to clinic for follow up on her hypertension, hyperlipidemia and GERD.  Denies any acute concerns.  Hypertension - She is not checking BP at home or outside of clinic.    - Current medications: losartan 50mg  daily and hydrochlorothiazide 12.5mg  daily, tolerating well without side effects - She is not currently symptomatic. - Pt denies headache, lightheadedness, dizziness, changes in vision, chest tightness/pressure, palpitations, leg swelling, sudden loss of speech or loss of consciousness. - She  reports no regular exercise routine. - Her diet is high in salt, high in fat, and high in carbohydrates.   Depression screen [facility] 2/9 10/22/2019 04/17/2019 09/12/2018  Decreased Interest 0 0 0  Down, Depressed, Hopeless 0 0 0  PHQ - 2 Score 0 0 0    Social History   Tobacco Use  . Smoking status: Never Smoker  . Smokeless tobacco: Never Used  Vaping Use  . Vaping Use: Never used  Substance Use Topics  . Alcohol use: No    Alcohol/week: 0.0 standard drinks  . Drug use: No    Review of Systems  Constitutional: Negative.   HENT: Negative.   Eyes: Negative.   Respiratory: Negative.   Cardiovascular: Negative.   Gastrointestinal: Negative.   Endocrine: Negative.   Genitourinary: Negative.   Musculoskeletal: Negative.   Skin: Negative.   Allergic/Immunologic: Negative.   Neurological: Negative.   Hematological: Negative.   Psychiatric/Behavioral: Negative.    Per HPI unless specifically indicated above     Objective:    BP 130/74 (BP Location: Left Arm, Patient Position: Sitting, Cuff Size: Large)   Pulse 74   Temp 98.2 F (36.8 C) (Oral)   Resp 17   Ht 4\' 11"  (1.499 m)   Wt 187 lb (84.8 kg)   SpO2 100%   BMI 37.77 kg/m   Wt Readings from Last 3 Encounters:    04/17/20 187 lb (84.8 kg)  10/18/19 187 lb 9.6 oz (85.1 kg)  10/02/19 187 lb 9.6 oz (85.1 kg)    Physical Exam Vitals and nursing note reviewed.  Constitutional:      General: She is not in acute distress.    Appearance: Normal appearance. She is well-developed and well-groomed. She is not ill-appearing or toxic-appearing.  HENT:     Head: Normocephalic and atraumatic.     Nose:     Comments: Tracey Carroll is in place, covering mouth and nose. Eyes:     General: Lids are normal. Vision grossly intact.        Right eye: No discharge.        Left eye: No discharge.     Extraocular Movements: Extraocular movements intact.     Conjunctiva/sclera: Conjunctivae normal.     Pupils: Pupils are equal, round, and reactive to light.  Cardiovascular:     Rate and Rhythm: Normal rate and regular rhythm.     Pulses: Normal pulses.     Heart sounds: Normal heart sounds. No murmur heard.  No friction rub. No gallop.   Pulmonary:     Effort: Pulmonary effort is normal. No respiratory distress.     Breath sounds: Normal breath sounds.  Skin:    General: Skin is warm and dry.     Capillary Refill: Capillary refill takes less  than 2 seconds.  Neurological:     General: No focal deficit present.     Mental Status: She is alert and oriented to person, place, and time.  Psychiatric:        Attention and Perception: Attention and perception normal.        Mood and Affect: Mood and affect normal.        Speech: Speech normal.        Behavior: Behavior normal. Behavior is cooperative.        Thought Content: Thought content normal.        Cognition and Memory: Cognition and memory normal.        Judgment: Judgment normal.    Results for orders placed or performed during the Carroll encounter of 09/17/19  Surgical pathology  Result Value Ref Range   SURGICAL PATHOLOGY      SURGICAL PATHOLOGY CASE: [id number] PATIENT: Tracey Carroll Surgical Pathology Report     Specimen Submitted: A.  Stomach polyp, antrum; cbx B. Stomach polyp x2, body; cbx C. Esophagus; cbx  Clinical History: Dysphagia R13.10.  Gastric polyps    DIAGNOSIS: A. GASTRIC POLYP, ANTRUM; COLD BIOPSY: - HYPERPLASTIC POLYP. - NEGATIVE FOR DYSPLASIA AND MALIGNANCY.  B. GASTRIC POLYPS X2, BODY; COLD BIOPSY: - FRAGMENTS SUGGESTIVE OF FUNDIC GLAND POLYPS. - NEGATIVE FOR H. PYLORI, DYSPLASIA, AND MALIGNANCY.  C. ESOPHAGUS; COLD BIOPSY: - SQUAMOCOLUMNAR MUCOSA WITH NO SIGNIFICANT HISTOPATHOLOGIC CHANGE. - NEGATIVE FOR INTESTINAL METAPLASIA, DYSPLASIA, AND MALIGNANCY.  GROSS DESCRIPTION: A. Labeled: Gastric antrum polyp cold biopsy Received: In formalin Tissue fragment(s): 1 Size: 0.3 cm Description: Tan tissue fragment Entirely submitted in 1 cassette.  B. Labeled: Gastric body polyps x2 cold biopsy Received: In formalin Tissue fragment(s): 2 Size: 0.3 x 0.3  x 0.1 cm Description: Aggregate of tan tissue fragments Entirely submitted in 1 cassette.  C. Labeled: Esophageal cold biopsies rule out EOE Received: In formalin Tissue fragment(s): 3 Size: 0.4 x 0.3 x 0.1 cm Description: Aggregate of pink-white tissue fragments Entirely submitted in 1 cassette.   Final Diagnosis performed by Allena Napoleon, MD.   Electronically signed 09/18/2019 10:54:27AM The electronic signature indicates that the named Attending Pathologist has evaluated the specimen Technical component performed at [facility], [address], [place] , [place] [address] Lab: [phone] Dir: Rush Farmer, MD, MMM  Professional component performed at [facility], [facility], [place], [place], [place] [address] Lab: [phone] Dir: Dellia Nims. Reuel Derby, MD       Assessment & Plan:   Problem List Items Addressed This Visit      Cardiovascular and Mediastinum   Essential hypertension - Primary    Controlled hypertension.  BP is at goal < 130/80.  Taking medications tolerating well without side effects.    Complications:  Obesity, hyperlipidemia, GERD  Plan: 1. Continue taking losartan 50mg  daily and hydrochlorothiazide 12.5mg  daily 2. Obtain labs at next visit  3. Encouraged heart healthy diet and increasing exercise to 30 minutes most days of the week, going no more than 2 days in a row without exercise. 4. Check BP 1-2 x per week at home, keep log, and bring to clinic at next appointment. 5. Follow up 6 months.       Relevant Medications   losartan (COZAAR) 50 MG tablet   hydrochlorothiazide (HYDRODIURIL) 12.5 MG tablet   atorvastatin (LIPITOR) 20 MG tablet     Digestive   Gastroesophageal reflux disease    Currently well controlled on omeprazole 40mg  twice daily.  Plan: 1. Continue  omeprazole 40mg  twice daily. Side effects discussed. Pt wants to continue med. 2. Avoid diet triggers. Reviewed need to seek care if globus sensation, difficulty swallowing, s/sx of GI bleed. 3. Follow up as needed and in 6 months.       Relevant Medications   omeprazole (PRILOSEC) 40 MG capsule     Other   Pure hypercholesterolemia    Stable and moderately controlled with atrovastatin 20mg  daily.  Will continue.  RTC in 6 months for follow up and labs.      Relevant Medications   losartan (COZAAR) 50 MG tablet   hydrochlorothiazide (HYDRODIURIL) 12.5 MG tablet   atorvastatin (LIPITOR) 20 MG tablet      Meds ordered this encounter  Medications  . losartan (COZAAR) 50 MG tablet    Sig: Take 1 tablet (50 mg total) by mouth daily.    Dispense:  90 tablet    Refill:  1  . hydrochlorothiazide (HYDRODIURIL) 12.5 MG tablet    Sig: Take 1 tablet (12.5 mg total) by mouth daily.    Dispense:  90 tablet    Refill:  1  . omeprazole (PRILOSEC) 40 MG capsule    Sig: Take 1 capsule (40 mg total) by mouth 2 (two) times daily.    Dispense:  180 capsule    Refill:  1  . atorvastatin (LIPITOR) 20 MG tablet    Sig: TAKE 1 TABLET BY MOUTH EVERY OTHER DAY    Dispense:  90 tablet    Refill:  1    Follow up plan: Return in about 3 months (around 07/16/2020) for HTN F/U.   Harlin Rain, [facility] Nurse Practitioner [facility] 04/17/2020, 12:09 PM

## 2020-04-17 NOTE — Assessment & Plan Note (Signed)
Controlled hypertension.  BP is at goal < 130/80.  Taking medications tolerating well without side effects.  Complications:  Obesity, hyperlipidemia, GERD  Plan: 1. Continue taking losartan 50mg  daily and hydrochlorothiazide 12.5mg  daily 2. Obtain labs at next visit  3. Encouraged heart healthy diet and increasing exercise to 30 minutes most days of the week, going no more than 2 days in a row without exercise. 4. Check BP 1-2 x per week at home, keep log, and bring to clinic at next appointment. 5. Follow up 6 months.

## 2020-04-17 NOTE — Assessment & Plan Note (Signed)
Currently well controlled on omeprazole 40mg  twice daily.  Plan: 1. Continue omeprazole 40mg  twice daily. Side effects discussed. Pt wants to continue med. 2. Avoid diet triggers. Reviewed need to seek care if globus sensation, difficulty swallowing, s/sx of GI bleed. 3. Follow up as needed and in 6 months.

## 2020-04-17 NOTE — Assessment & Plan Note (Signed)
Stable and moderately controlled with atrovastatin 20mg  daily.  Will continue.  RTC in 6 months for follow up and labs.

## 2020-04-17 NOTE — Patient Instructions (Signed)
Continue all medications as directed.  Work towards reduction of salt in diet and increase in water daily.  Try to get exercise a minimum of 30 minutes per day at least 5 days per week as well as  adequate water intake all while measuring blood pressure a few times per week.  Keep a blood pressure log and bring back to clinic at your next visit.  If your readings are consistently over 130/80 to contact our office/send me a MyChart message and we will see you sooner.  Can try DASH and Mediterranean diet options, avoiding processed foods, lowering sodium intake, avoiding pork products, and eating a plant based diet for optimal health.      Mediterranean Diet  Why follow it? Research shows. . Those who follow the Mediterranean diet have a reduced risk of heart disease  . The diet is associated with a reduced incidence of Parkinson's and Alzheimer's diseases . People following the diet may have longer life expectancies and lower rates of chronic diseases  . The Dietary Guidelines for Americans recommends the Mediterranean diet as an eating plan to promote health and prevent disease  What Is the Mediterranean Diet?  . Healthy eating plan based on typical foods and recipes of Mediterranean-style cooking . The diet is primarily a plant based diet; these foods should make up a majority of meals   Starches - Plant based foods should make up a majority of meals - They are an important sources of vitamins, minerals, energy, antioxidants, and fiber - Choose whole grains, foods high in fiber and minimally processed items  - Typical grain sources include wheat, oats, barley, corn, brown rice, bulgar, farro, millet, polenta, couscous  - Various types of beans include chickpeas, lentils, fava beans, black beans, white beans   Fruits  Veggies - Large quantities of antioxidant rich fruits & veggies; 6 or more servings  - Vegetables can be eaten raw or lightly drizzled with oil and cooked  - Vegetables  common to the traditional Mediterranean Diet include: artichokes, arugula, beets, broccoli, brussel sprouts, cabbage, carrots, celery, collard greens, cucumbers, eggplant, kale, leeks, lemons, lettuce, mushrooms, okra, onions, peas, peppers, potatoes, pumpkin, radishes, rutabaga, shallots, spinach, sweet potatoes, turnips, zucchini - Fruits common to the Mediterranean Diet include: apples, apricots, avocados, cherries, clementines, dates, figs, grapefruits, grapes, melons, nectarines, oranges, peaches, pears, pomegranates, strawberries, tangerines  Fats - Replace butter and margarine with healthy oils, such as olive oil, canola oil, and tahini  - Limit nuts to no more than a handful a day  - Nuts include walnuts, almonds, pecans, pistachios, pine nuts  - Limit or avoid candied, honey roasted or heavily salted nuts - Olives are central to the [employer] - can be eaten whole or used in a variety of dishes   Meats Protein - Limiting red meat: no more than a few times a month - When eating red meat: choose lean cuts and keep the portion to the size of deck of cards - Eggs: approx. 0 to 4 times a week  - Fish and lean poultry: at least 2 a week  - Healthy protein sources include, chicken, [place], lean beef, lamb - Increase intake of seafood such as tuna, salmon, trout, mackerel, shrimp, scallops - Avoid or limit high fat processed meats such as sausage and bacon  Dairy - Include moderate amounts of low fat dairy products  - Focus on healthy dairy such as fat free yogurt, skim milk, low or reduced fat cheese - Limit dairy  products higher in fat such as whole or 2% milk, cheese, ice cream  Alcohol - Moderate amounts of red wine is ok  - No more than 5 oz daily for women (all ages) and men older than age 21  - No more than 10 oz of wine daily for men younger than 65  Other - Limit sweets and other desserts  - Use herbs and spices instead of salt to flavor foods  - Herbs and spices common to the  traditional Mediterranean Diet include: basil, bay leaves, chives, cloves, cumin, fennel, garlic, lavender, marjoram, mint, oregano, parsley, pepper, rosemary, sage, savory, sumac, tarragon, thyme   It's not just a diet, it's a lifestyle:  . The Mediterranean diet includes lifestyle factors typical of those in the region  . Foods, drinks and meals are best eaten with others and savored . Daily physical activity is important for overall good health . This could be strenuous exercise like running and aerobics . This could also be more leisurely activities such as walking, housework, yard-work, or taking the stairs . Moderation is the key; a balanced and healthy diet accommodates most foods and drinks . Consider portion sizes and frequency of consumption of certain foods   Meal Ideas & Options:  . Breakfast:  o Whole wheat toast or whole wheat English muffins with peanut butter & hard boiled egg o Steel cut oats topped with apples & cinnamon and skim milk  o Fresh fruit: banana, strawberries, melon, berries, peaches  o Smoothies: strawberries, bananas, greek yogurt, peanut butter o Low fat greek yogurt with blueberries and granola  o Egg white omelet with spinach and mushrooms o Breakfast couscous: whole wheat couscous, apricots, skim milk, cranberries  . Sandwiches:  o Hummus and grilled vegetables (peppers, zucchini, squash) on whole wheat bread   o Grilled chicken on whole wheat pita with lettuce, tomatoes, cucumbers or tzatziki  o Tuna salad on whole wheat bread: tuna salad made with greek yogurt, olives, red peppers, capers, green onions o Garlic rosemary lamb pita: lamb sauted with garlic, rosemary, salt & pepper; add lettuce, cucumber, greek yogurt to pita - flavor with lemon juice and black pepper  . Seafood:  o Mediterranean grilled salmon, seasoned with garlic, basil, parsley, lemon juice and black pepper o Shrimp, lemon, and spinach whole-grain pasta salad made with low fat greek  yogurt  o Seared scallops with lemon orzo  o Seared tuna steaks seasoned salt, pepper, coriander topped with tomato mixture of olives, tomatoes, olive oil, minced garlic, parsley, green onions and cappers  . Meats:  o Herbed greek chicken salad with kalamata olives, cucumber, feta  o Red bell peppers stuffed with spinach, bulgur, lean ground beef (or lentils) & topped with feta   o Kebabs: skewers of chicken, tomatoes, onions, zucchini, squash  o [place] burgers: made with red onions, mint, dill, lemon juice, feta cheese topped with roasted red peppers . Vegetarian o Cucumber salad: cucumbers, artichoke hearts, celery, red onion, feta cheese, tossed in olive oil & lemon juice  o Hummus and whole grain pita points with a greek salad (lettuce, tomato, feta, olives, cucumbers, red onion) o Lentil soup with celery, carrots made with vegetable broth, garlic, salt and pepper  o Tabouli salad: parsley, bulgur, mint, scallions, cucumbers, tomato, radishes, lemon juice, olive oil, salt and pepper.  We will plan to see you back in 3 months for hypertension follow up visit  You will receive a survey after today's visit either digitally by e-mail or  paper by [employer]. Your experiences and feedback matter to [place].  Please respond so we know how we are doing as we provide care for you.  Call us with any questions/concerns/needs.  It is my goal to be available to you for your health concerns.  Thanks for choosing me to be a partner in your healthcare needs!  Harlin Rain, FNP-C Family Nurse Practitioner [facility] Phone: [phone]

## 2020-04-27 ENCOUNTER — Telehealth: Payer: Self-pay | Admitting: General Practice

## 2020-04-27 ENCOUNTER — Telehealth: Payer: Self-pay

## 2020-04-27 NOTE — Progress Notes (Signed)
04/28/2020 10:36 AM   Tracey Carroll 02/13/1941 [id number]  Referring provider: No referring provider defined for this encounter.  Chief Complaint  Patient presents with  . Vaginal Atrophy    HPI: Patient is a 79 year old female with an urological history of high risk hematuria and vaginal atrophy who presents today for follow up.  High risk hematuria Non-smoker.  She did undergo a hematuria work up in 2015 with CTU and cystoscopy.  No malignancies were found.  CTU on 01/13/2017 revealed no acute findings within the abdomen or pelvis.  No urinary tract calculi or other explanation for patient's hematuria. Aortic atherosclerosis.  Cysto on 02/28/2017 was NED.   She denies any gross hematuria.  Her UA is negative for micro heme.   Vaginal atrophy She is applying the vaginal estrogen cream Monday, Wednesday and Friday nights.  She denies any vaginal discharge or any vaginal discomfort.  The patient is  experiencing urgency x 0-3, frequency x 0-3, not restricting fluids to avoid visits to the restroom, is engaging in toilet mapping, incontinence x 0-3 and nocturia x 4-7.   Her BP is 132/77.   Her PVR is 40 mL.    UA is benign.    Patient denies any modifying or aggravating factors.  Patient denies any gross hematuria, dysuria or suprapubic/flank pain.  Patient denies any fevers, chills, nausea or vomiting.   PMI: Past Medical History:  Diagnosis Date  . Acid reflux 10/19/2014  . Arthritis    arms  . Back pain, chronic 10/19/2014  . Dysphagia 10/20/2014  . Elevated BP 10/20/2014  . Fibroids, intramural 10/19/2014  . FOM (frequency of micturition) 10/19/2014  . GERD (gastroesophageal reflux disease)   . Hyperlipidemia   . Hypertension   . 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 LBP (low back pain) 10/19/2014    Surgical History: Past Surgical History:  Procedure Laterality Date  . ESOPHAGOGASTRODUODENOSCOPY (EGD) WITH PROPOFOL N/A 11/20/2014   Procedure: ESOPHAGOGASTRODUODENOSCOPY (EGD)  WITH PROPOFOL with dilation;  Surgeon: Lucilla Lame, MD;  Location: [facility];  Service: Endoscopy;  Laterality: N/A;  . ESOPHAGOGASTRODUODENOSCOPY (EGD) WITH PROPOFOL N/A 09/17/2019   Procedure: ESOPHAGOGASTRODUODENOSCOPY (EGD) WITH PROPOFOL;  Surgeon: Virgel Manifold, MD;  Location: [facility];  Service: Endoscopy;  Laterality: N/A;  . TUBAL LIGATION      Home Medications:  Allergies as of 04/28/2020   No Known Allergies     Medication List       Accurate as of April 28, 2020 10:36 AM. If you have any questions, ask your nurse or doctor.        atorvastatin 20 MG tablet Commonly known as: LIPITOR TAKE 1 TABLET BY MOUTH EVERY OTHER DAY   hydrochlorothiazide 12.5 MG tablet Commonly known as: HYDRODIURIL Take 1 tablet (12.5 mg total) by mouth daily.   losartan 50 MG tablet Commonly known as: COZAAR Take 1 tablet (50 mg total) by mouth daily.   omeprazole 40 MG capsule Commonly known as: PRILOSEC Take 1 capsule (40 mg total) by mouth 2 (two) times daily.   Premarin vaginal cream Generic drug: conjugated estrogens Apply 0.5mg  (pea-sized amount)  just inside the vaginal introitus with a finger-tip on  Monday, Wednesday and Friday nights.       Allergies: No Known Allergies  Family History: Family History  Problem Relation Age of Onset  . Diabetes Father        in her 31 and 29s  . Hypertension Daughter  her 38s  . Kidney cancer Neg Hx   . Bladder Cancer Neg Hx     Social History:  reports that she has never smoked. She has never used smokeless tobacco. She reports that she does not drink alcohol and does not use drugs.  ROS: For pertinent review of systems please refer to history of present illness  Physical Exam: BP 132/77   Pulse 88   Ht 4\' 11"  (1.499 m)   Wt 188 lb 1.6 oz (85.3 kg)   BMI 37.99 kg/m   Constitutional:  Well nourished. Alert and oriented, No acute distress. HEENT: Notus AT, mask in place.  Trachea  midline Cardiovascular: No clubbing, cyanosis, or edema. Respiratory: Normal respiratory effort, no increased work of breathing. GU: No CVA tenderness.  No bladder fullness or masses.  Atrophic external genitalia, normal pubic hair distribution, no lesions.  Normal urethral meatus, no lesions, no prolapse, no discharge.   No urethral masses, tenderness and/or tenderness. No bladder fullness, tenderness or masses. Pale vagina mucosa, good estrogen effect, no discharge, no lesions, fair pelvic support, grade I cystocele and grade I rectocele noted.  Anus and perineum are without rashes or lesions.   Neurologic: Grossly intact, no focal deficits, moving all 4 extremities. Psychiatric: Normal mood and affect.   Laboratory Data: Lab Results  Component Value Date   CREATININE 0.72 08/30/2019    Lab Results  Component Value Date   AST 19 08/30/2019   Lab Results  Component Value Date   ALT 10 08/30/2019    Urinalysis Component     Latest Ref Rng & Units 04/28/2020  Specific Gravity, UA     1.005 - 1.030 1.015  pH, UA     5.0 - 7.5 5.0  Color, UA     Yellow Yellow  Appearance Ur     Clear Hazy (A)  Leukocytes,UA     Negative Negative  Protein,UA     Negative/Trace Negative  Glucose, UA     Negative Negative  Ketones, UA     Negative Negative  RBC, UA     Negative Trace (A)  Bilirubin, UA     Negative Negative  Urobilinogen, Ur     0.2 - 1.0 mg/dL 0.2  Nitrite, UA     Negative Negative  Microscopic Examination      See below:   Component     Latest Ref Rng & Units 04/28/2020  WBC, UA     0 - 5 /hpf 0-5  RBC     0 - 2 /hpf 0-2  Epithelial Cells (non renal)     0 - 10 /hpf 0-10  Renal Epithel, UA     None seen /hpf 0-10 (A)  Bacteria, UA     None seen/Few None seen  I have reviewed the labs.  Pertinent imaging Results for SWAY, GUTTIERREZ (MRN [id number]) as of 04/28/2020 10:20  Ref. Range 04/28/2020 09:54  Scan Result Unknown 40    Assessment & Plan:     1. Vaginal atrophy Continue vaginal estrogen cream Monday, Wednesday and Friday nights Refill for Premarin cream sent to [facility] on [address] Return to the clinic in 1 year for exam  2.  History of high risk hematuria Work-up was completed in 2015 and 2018-NED No reports of gross hematuria UA today negative for micro heme Return in 1 year for UA-patient report any gross hematuria in the interim  3. Nocturia UA benign Not bothersome to patient at this time  Return  in about 1 year (around 04/28/2021) for OAB questionnaire, PVR and exam.  These notes generated with voice recognition software. I apologize for typographical errors.  Zara Council, PA-C  [facility] [address] [place], [place] [address] [phone]

## 2020-04-27 NOTE — Telephone Encounter (Signed)
  Chronic Care Management   Outreach Note  04/27/2020 Name: COLLIN HENDLEY MRN: [id number] DOB: [birth year]  Referred by: Verl Bangs, FNP Reason for referral : Appointment (RNCM Chronic Disease Management and Care Coordination Needs)   ROGAN ECKLUND is enrolled in a Managed Medicaid Health Plan: No  An unsuccessful telephone outreach was attempted today. The patient was referred to the case management team for assistance with care management and care coordination.   Follow Up Plan:  The care management team will reach out to the patient again over the next 30 to 60 days.   Noreene Larsson RN, MSN, [place] Mobile: [phone]

## 2020-04-28 ENCOUNTER — Other Ambulatory Visit: Payer: Self-pay

## 2020-04-28 ENCOUNTER — Encounter: Payer: Self-pay | Admitting: Urology

## 2020-04-28 ENCOUNTER — Ambulatory Visit (INDEPENDENT_AMBULATORY_CARE_PROVIDER_SITE_OTHER): Payer: Medicare Other | Admitting: Urology

## 2020-04-28 VITALS — BP 132/77 | HR 88 | Ht 59.0 in | Wt 188.1 lb

## 2020-04-28 DIAGNOSIS — R319 Hematuria, unspecified: Secondary | ICD-10-CM | POA: Diagnosis not present

## 2020-04-28 DIAGNOSIS — N952 Postmenopausal atrophic vaginitis: Secondary | ICD-10-CM

## 2020-04-28 LAB — BLADDER SCAN AMB NON-IMAGING: Scan Result: 40

## 2020-04-28 MED ORDER — PREMARIN 0.625 MG/GM VA CREA: TOPICAL_CREAM | VAGINAL | 12 refills | Status: DC

## 2020-04-29 ENCOUNTER — Telehealth: Payer: Self-pay

## 2020-04-29 LAB — URINALYSIS, COMPLETE
Bilirubin, UA: NEGATIVE
Glucose, UA: NEGATIVE
Ketones, UA: NEGATIVE
Leukocytes,UA: NEGATIVE
Nitrite, UA: NEGATIVE
Protein,UA: NEGATIVE
Specific Gravity, UA: 1.015 (ref 1.005–1.030)
Urobilinogen, Ur: 0.2 mg/dL (ref 0.2–1.0)
pH, UA: 5 (ref 5.0–7.5)

## 2020-04-29 LAB — MICROSCOPIC EXAMINATION: Bacteria, UA: NONE SEEN

## 2020-04-29 NOTE — Chronic Care Management (AMB) (Signed)
°  Care Management   Note  04/29/2020 Name: [name] MRN: [id number] DOB: 02-13-1941  [name] is a 79 y.o. year old female who is a primary care patient of Lorine Bears, Lupita Raider, FNP and is actively engaged with the care management team. I reached out to [name] by phone today to assist with re-scheduling a follow up visit with the RN Case Manager  Follow up plan: Unsuccessful telephone outreach attempt made. A HIPAA compliant phone message was left for the patient providing contact information and requesting a return call.  The care management team will reach out to the patient again over the next 7 days.  If patient returns call to provider office, please advise to call [facility]  at [place], [place], [place], [place] [address] Direct Dial: [phone] [email or web address] Website: [email or web address]

## 2020-05-06 NOTE — Chronic Care Management (AMB) (Signed)
  Care Management   Note  05/06/2020 Name: CARON ODE MRN: [id number] DOB: [birth year]  [name] is a 79 y.o. year old female who is a primary care patient of Lorine Bears, Lupita Raider, [facility] and is actively engaged with the care management team. I reached out to [name] by phone today to assist with re-scheduling a follow up visit with the RN Case Manager  Follow up plan: Telephone appointment with care management team member scheduled for:05/19/2019  Noreene Larsson, [place], [place],  [address] Direct Dial: [phone] [email or web address] Website: [email or web address]

## 2020-05-06 NOTE — Telephone Encounter (Signed)
Pt has been r/s  

## 2020-05-18 ENCOUNTER — Telehealth: Payer: Medicare Other | Admitting: General Practice

## 2020-05-18 ENCOUNTER — Ambulatory Visit: Payer: Self-pay | Admitting: General Practice

## 2020-05-18 DIAGNOSIS — I10 Essential (primary) hypertension: Secondary | ICD-10-CM

## 2020-05-18 DIAGNOSIS — K219 Gastro-esophageal reflux disease without esophagitis: Secondary | ICD-10-CM

## 2020-05-18 DIAGNOSIS — E7800 Pure hypercholesterolemia, unspecified: Secondary | ICD-10-CM

## 2020-05-18 DIAGNOSIS — E782 Mixed hyperlipidemia: Secondary | ICD-10-CM

## 2020-05-18 DIAGNOSIS — R1319 Other dysphagia: Secondary | ICD-10-CM

## 2020-05-18 NOTE — Chronic Care Management (AMB) (Signed)
Chronic Care Management   Follow Up Note   05/18/2020 Name: Tracey Carroll MRN: [id number] DOB: [birth year]  Referred by: Verl Bangs, FNP Reason for referral : Chronic Care Management (RNCM: Follow up for Chronic Disease Management and Care Coordination Needs )   Tracey Carroll is a 80 y.o. year old female who is a primary care patient of Verl Bangs, FNP. The CCM team was consulted for assistance with chronic disease management and care coordination needs.    Review of patient status, including review of consultants reports, relevant laboratory and other test results, and collaboration with appropriate care team members and the patient's provider was performed as part of comprehensive patient evaluation and provision of chronic care management services.    SDOH (Social Determinants of Health) assessments performed: Yes See Care Plan activities for detailed interventions related to [facility])     Outpatient Encounter Medications as of 05/18/2020  Medication Sig   atorvastatin (LIPITOR) 20 MG tablet TAKE 1 TABLET BY MOUTH EVERY OTHER DAY   conjugated estrogens (PREMARIN) vaginal cream Apply 0.5mg  (pea-sized amount)  just inside the vaginal introitus with a finger-tip on  Monday, Wednesday and Friday nights.   hydrochlorothiazide (HYDRODIURIL) 12.5 MG tablet Take 1 tablet (12.5 mg total) by mouth daily.   losartan (COZAAR) 50 MG tablet Take 1 tablet (50 mg total) by mouth daily.   omeprazole (PRILOSEC) 40 MG capsule Take 1 capsule (40 mg total) by mouth 2 (two) times daily.   No facility-administered encounter medications on file as of 05/18/2020.     Objective:   Goals Addressed            This Visit's Progress    RNCM: I am have difficulty swallowing       Current Barriers:   Knowledge Deficits related to swallowing difficulties and how to obtain care for dysphagia  Nurse Case Manager Clinical Goal(s):   Over the next 120 days, patient will verbalize understanding  of plan for seeing the new NP at [facility] for an appointment to evaluate and recommend treatment for issues the patient is having with swallowing- completed- the patient sees a specialist on May 4th  Over the next 90 days, patient will work with [facility] and pcp to address needs related to swallowing problems and referrals as needed to evaluate and treat swallowing issues   Over the next 90 days, patient will attend all scheduled medical appointments: staff to call and make an appointment with the pcp to be seen- completed   Over the next 90 days, the patient will demonstrate ongoing self health care management ability as evidenced by improved swallowing and no problems related to ineffective airway clearance   Interventions:   Evaluation of current treatment plan related to GERD and patient's adherence to plan as established by provider. 02-24-2020: The patient is going on 04-02-2020 to specialist for evaluation of dysphagia and GERD. The patient states that they tried to do the light but was having a hard time with getting it to pass through.  They told her she did not need her esophagus stretched however she feels like they need to do something to help her.  She is being careful with eating and making sure she chews her food up good so she will not get choked. 05-18-2020: The patient had to cancel her appointment in November with the specialist and is now going on 06-10-2020.  She is hopeful that she will be able to go to this appointment in [facility].  Denies any acute distress related to her throat but still needs to find answers to what is going on with her swallowing.   Provided education to patient re: to process of obtaining appointment with pcp for evaluation, recommended the patient write down questions to ask the pcp. The patient has had her esophagus stretched x 2 in the past- completed. The patient has an upcoming appointment with a specialist for upcoming evaluation and treatment. 05-18-2020- the  patient has an appointment on 06-10-2020 in [place] to see the specialist about her dysphagia   Reviewed medications with patient and discussed compliance.  Patient endorses compliance and denies any concerns with medications. 02-24-2020: The patient is almost out of her omeprazole and ask if the pcp could call in a refill for omeprazole 40 mg.  Will send the pcp an in basket message and ask for assistance. 05-18-2020: The patient endorses compliance with medications   Discussed plans with patient for ongoing care management follow up and provided patient with direct contact information for care management team  Provided patient with GERD educational materials related to swallowing difficulties   Reviewed scheduled/upcoming provider appointments including: Patient has an appointment with the specialist on 06-10-2020 for evaluation and further treatment options, the evaluation is in [facility] with the specialist.   Patient Self Care Activities:   Patient verbalizes understanding of plan to work with the CCM team to meet her health and wellness needs   Self administers medications as prescribed  Attends all scheduled provider appointments  Calls provider office for new concerns or questions  Unable to independently manage GERD signs and symptoms as evidence by difficulty swallowing   Please see past updates related to this goal by clicking on the "Past Updates" button in the selected goal       RNCM: My blood pressure and cholesterol is better since I take medications       Current Barriers:   Chronic Disease Management support, education, and care coordination needs related to HTN and HLD  Clinical Goal(s) related to HTN and HLD:  Over the next 120 days, patient will:   Work with the care management team to address educational, disease management, and care coordination needs   Begin or continue self health monitoring activities as directed today Measure and record blood pressure 5  times per week  Call provider office for new or worsened signs and symptoms Blood pressure findings outside established parameters and New or worsened symptom related to HLD and other Chronic disease processes   Call care management team with questions or concerns  Verbalize basic understanding of patient centered plan of care established today  Interventions related to HTN and HLD:   Evaluation of current treatment plans and patient's adherence to plan as established by provider.  The patient states she is doing well with managing her blood pressure and cholesterol.   Assessed patient understanding of disease states: Review of Chronic condtions and the patient states she feels she is doing well at this time. Has several upcoming appointments   Assessed patient's education and care coordination needs: The patient states that she is still watching her diet.  Still has the swallowing issus but they are not worse. Will see specialist soon. The patient denies any new concerns related to her HTN or HLD.   Provided disease specific education to patient: Education on checking blood pressure, adhering to heart Healthy diet and calling provider for changes in condition. The patient is checking blood pressure regularly- last reading  123/80.  The patient endorses heart healthy diet and watching sodium content.   Collaborated with appropriate clinical care team members regarding patient needs.  Knows CCM team is here to help with health and wellness needs.   Patient Self Care Activities related to HTN and HLD:   Patient is unable to independently self-manage chronic health conditions  Please see past updates related to this goal by clicking on the "Past Updates" button in the selected goal           Plan:   Telephone follow up appointment with care management team member scheduled for: 07-27-2020  At 11:45 am  Noreene Larsson RN, MSN, [place] Mobile: [phone]

## 2020-05-18 NOTE — Patient Instructions (Signed)
Visit Information  Goals Addressed            This Visit's Progress   . RNCM: I am have difficulty swallowing       Current Barriers:  Marland Kitchen Knowledge Deficits related to swallowing difficulties and how to obtain care for dysphagia  Nurse Case Manager Clinical Goal(s):  Marland Kitchen Over the next 120 days, patient will verbalize understanding of plan for seeing the new NP at [facility] for an appointment to evaluate and recommend treatment for issues the patient is having with swallowing- completed- the patient sees a specialist on May 4th . Over the next 90 days, patient will work with [facility] and pcp to address needs related to swallowing problems and referrals as needed to evaluate and treat swallowing issues  . Over the next 90 days, patient will attend all scheduled medical appointments: staff to call and make an appointment with the pcp to be seen- completed  . Over the next 90 days, the patient will demonstrate ongoing self health care management ability as evidenced by improved swallowing and no problems related to ineffective airway clearance   Interventions:  . Evaluation of current treatment plan related to GERD and patient's adherence to plan as established by provider. 02-24-2020: The patient is going on 04-02-2020 to specialist for evaluation of dysphagia and GERD. The patient states that they tried to do the light but was having a hard time with getting it to pass through.  They told her she did not need her esophagus stretched however she feels like they need to do something to help her.  She is being careful with eating and making sure she chews her food up good so she will not get choked. 05-18-2020: The patient had to cancel her appointment in November with the specialist and is now going on 06-10-2020.  She is hopeful that she will be able to go to this appointment in [facility]. Denies any acute distress related to her throat but still needs to find answers to what is going on with her swallowing.   . Provided education to patient re: to process of obtaining appointment with pcp for evaluation, recommended the patient write down questions to ask the pcp. The patient has had her esophagus stretched x 2 in the past- completed. The patient has an upcoming appointment with a specialist for upcoming evaluation and treatment. 05-18-2020- the patient has an appointment on 06-10-2020 in [place] to see the specialist about her dysphagia  . Reviewed medications with patient and discussed compliance.  Patient endorses compliance and denies any concerns with medications. 02-24-2020: The patient is almost out of her omeprazole and ask if the pcp could call in a refill for omeprazole 40 mg.  Will send the pcp an in basket message and ask for assistance. 05-18-2020: The patient endorses compliance with medications  . Discussed plans with patient for ongoing care management follow up and provided patient with direct contact information for care management team . Provided patient with GERD educational materials related to swallowing difficulties  . Reviewed scheduled/upcoming provider appointments including: Patient has an appointment with the specialist on 06-10-2020 for evaluation and further treatment options, the evaluation is in [facility] with the specialist.   Patient Self Care Activities:  . Patient verbalizes understanding of plan to work with the CCM team to meet her health and wellness needs  . Self administers medications as prescribed . Attends all scheduled provider appointments . Calls provider office for new concerns or questions .  Unable to independently manage GERD signs and symptoms as evidence by difficulty swallowing   Please see past updates related to this goal by clicking on the "Past Updates" button in the selected goal      . RNCM: My blood pressure and cholesterol is better since I take medications       Current Barriers:  . Chronic Disease Management support, education, and care  coordination needs related to HTN and HLD  Clinical Goal(s) related to HTN and HLD:  Over the next 120 days, patient will:  . Work with the care management team to address educational, disease management, and care coordination needs  . Begin or continue self health monitoring activities as directed today Measure and record blood pressure 5 times per week . Call provider office for new or worsened signs and symptoms Blood pressure findings outside established parameters and New or worsened symptom related to HLD and other Chronic disease processes  . Call care management team with questions or concerns . Verbalize basic understanding of patient centered plan of care established today  Interventions related to HTN and HLD:  . Evaluation of current treatment plans and patient's adherence to plan as established by provider.  The patient states she is doing well with managing her blood pressure and cholesterol.  . Assessed patient understanding of disease states: Review of Chronic condtions and the patient states she feels she is doing well at this time. Has several upcoming appointments  . Assessed patient's education and care coordination needs: The patient states that she is still watching her diet.  Still has the swallowing issus but they are not worse. Will see specialist soon. The patient denies any new concerns related to her HTN or HLD.  Marland Kitchen Provided disease specific education to patient: Education on checking blood pressure, adhering to heart Healthy diet and calling provider for changes in condition. The patient is checking blood pressure regularly- last reading 123/80.  The patient endorses heart healthy diet and watching sodium content.  Nash Dimmer with appropriate clinical care team members regarding patient needs.  Knows CCM team is here to help with health and wellness needs.   Patient Self Care Activities related to HTN and HLD:  . Patient is unable to independently self-manage chronic  health conditions  Please see past updates related to this goal by clicking on the "Past Updates" button in the selected goal         The patient verbalized understanding of instructions, educational materials, and care plan provided today and declined offer to receive copy of patient instructions, educational materials, and care plan.   Telephone follow up appointment with care management team member scheduled for: 07-27-2020 at 11:45 am  Noreene Larsson RN, MSN, [place] Mobile: [phone]

## 2020-06-11 DIAGNOSIS — R1312 Dysphagia, oropharyngeal phase: Secondary | ICD-10-CM | POA: Diagnosis not present

## 2020-06-24 ENCOUNTER — Other Ambulatory Visit: Payer: Self-pay | Admitting: Family Medicine

## 2020-06-24 DIAGNOSIS — I10 Essential (primary) hypertension: Secondary | ICD-10-CM

## 2020-06-24 MED ORDER — HYDROCHLOROTHIAZIDE 12.5 MG PO TABS: 12.5000 mg | ORAL_TABLET | Freq: Every day | ORAL | 0 refills | Status: DC

## 2020-06-24 NOTE — Telephone Encounter (Signed)
Medication Refill - Medication: Hydrochlorothiazide   Has the patient contacted their pharmacy? Yes.   Pt states that she reached out to pharmacy and they state she has no refills. Please advise.  (Agent: If no, request that the patient contact the pharmacy for the refill.) (Agent: If yes, when and what did the pharmacy advise?)  Preferred Pharmacy (with phone number or street name):  [facility] [place] - [address]  [place] ([place])  [address]  Phone: [phone] Fax: [phone]  Hours: Not open 24 hours     Agent: Please be advised that RX refills may take up to 3 business days. We ask that you follow-up with your pharmacy.

## 2020-07-07 ENCOUNTER — Other Ambulatory Visit: Payer: Self-pay

## 2020-07-07 ENCOUNTER — Ambulatory Visit (INDEPENDENT_AMBULATORY_CARE_PROVIDER_SITE_OTHER): Payer: Medicare Other | Admitting: Unknown Physician Specialty

## 2020-07-07 ENCOUNTER — Encounter: Payer: Self-pay | Admitting: Unknown Physician Specialty

## 2020-07-07 VITALS — BP 122/69 | HR 73 | Temp 97.1°F | Wt 184.8 lb

## 2020-07-07 DIAGNOSIS — I10 Essential (primary) hypertension: Secondary | ICD-10-CM | POA: Diagnosis not present

## 2020-07-07 DIAGNOSIS — E7800 Pure hypercholesterolemia, unspecified: Secondary | ICD-10-CM | POA: Diagnosis not present

## 2020-07-07 DIAGNOSIS — Z1159 Encounter for screening for other viral diseases: Secondary | ICD-10-CM | POA: Diagnosis not present

## 2020-07-07 NOTE — Assessment & Plan Note (Signed)
Last labs stable.  Check lipid panel today

## 2020-07-07 NOTE — Assessment & Plan Note (Signed)
Stable, continue present medications.   

## 2020-07-07 NOTE — Progress Notes (Signed)
BP 122/69 (BP Location: Right Arm, Patient Position: Sitting, Cuff Size: Normal)   Pulse 73   Temp (!) 97.1 F (36.2 C) (Temporal)   Wt 184 lb 12.8 oz (83.8 kg)   SpO2 100%   BMI 37.33 kg/m    Subjective:    Patient ID: Tracey Carroll, female    DOB: 02-20-1941, 80 y.o.   MRN: [id number]  HPI: TECORA EUSTACHE is a 80 y.o. female  Chief Complaint  Patient presents with  . Follow-up  . Hypertension    Monitors BP at home and its been normal.   Hypertension Using medications without difficulty Average home BPs checks on occasion and similar to today   No problems or lightheadedness - some lightheadedness No chest pain with exertion or shortness of breath Ankle and foot swelling   Hyperlipidemia Using medications without problems: No Muscle aches  Diet compliance: poor Exercise:poor   Relevant past medical, surgical, family and social history reviewed and updated as indicated. Interim medical history since our last visit reviewed. Allergies and medications reviewed and updated.  Review of Systems  Per HPI unless specifically indicated above     Objective:    BP 122/69 (BP Location: Right Arm, Patient Position: Sitting, Cuff Size: Normal)   Pulse 73   Temp (!) 97.1 F (36.2 C) (Temporal)   Wt 184 lb 12.8 oz (83.8 kg)   SpO2 100%   BMI 37.33 kg/m   Wt Readings from Last 3 Encounters:  07/07/20 184 lb 12.8 oz (83.8 kg)  04/28/20 188 lb 1.6 oz (85.3 kg)  04/17/20 187 lb (84.8 kg)    Physical Exam Constitutional:      General: She is not in acute distress.    Appearance: Normal appearance. She is well-developed and well-nourished.  HENT:     Head: Normocephalic and atraumatic.  Eyes:     General: Lids are normal. No scleral icterus.       Right eye: No discharge.        Left eye: No discharge.     Conjunctiva/sclera: Conjunctivae normal.  Neck:     Vascular: No carotid bruit or JVD.  Cardiovascular:     Rate and Rhythm: Normal rate and regular  rhythm.     Heart sounds: Normal heart sounds.  Pulmonary:     Effort: Pulmonary effort is normal.     Breath sounds: Normal breath sounds.  Abdominal:     Palpations: There is no hepatomegaly or splenomegaly.  Musculoskeletal:        General: Normal range of motion.     Cervical back: Normal range of motion and neck supple.     Comments: Bilateral ankle swelling without pitting edema  Skin:    General: Skin is warm, dry and intact.     Coloration: Skin is not pale.     Findings: No rash.  Neurological:     Mental Status: She is alert and oriented to person, place, and time.  Psychiatric:        Mood and Affect: Mood and affect normal.        Behavior: Behavior normal.        Thought Content: Thought content normal.        Judgment: Judgment normal.       Assessment & Plan:   Problem List Items Addressed This Visit      Unprioritized   Essential hypertension    Stable, continue present medications.        Relevant  Orders   Comprehensive metabolic panel   Lipid panel   Pure hypercholesterolemia    Last labs stable.  Check lipid panel today      Relevant Orders   Comprehensive metabolic panel   Lipid panel       Follow up plan: No follow-ups on file.

## 2020-07-08 LAB — LIPID PANEL
Cholesterol: 194 mg/dL (ref <200)
HDL: 66 mg/dL (ref >=50)
LDL Cholesterol (Calc): 109 mg/dL (calc) — ABNORMAL HIGH
Non-HDL Cholesterol (Calc): 128 mg/dL (calc) (ref <130)
Total CHOL/HDL Ratio: 2.9 (calc) (ref <5.0)
Triglycerides: 96 mg/dL (ref <150)

## 2020-07-08 LAB — HEPATITIS C ANTIBODY
Hepatitis C Ab: NONREACTIVE
SIGNAL TO CUT-OFF: 0.01 (ref <1.00)

## 2020-07-08 LAB — COMPREHENSIVE METABOLIC PANEL
AG Ratio: 1.6 (calc) (ref 1.0–2.5)
ALT: 8 U/L (ref 6–29)
AST: 18 U/L (ref 10–35)
Albumin: 4.4 g/dL (ref 3.6–5.1)
Alkaline phosphatase (APISO): 51 U/L (ref 37–153)
BUN: 11 mg/dL (ref 7–25)
CO2: 30 mmol/L (ref 20–32)
Calcium: 9.6 mg/dL (ref 8.6–10.4)
Chloride: 102 mmol/L (ref 98–110)
Creat: 0.82 mg/dL (ref 0.60–0.93)
Globulin: 2.7 g/dL (calc) (ref 1.9–3.7)
Glucose, Bld: 99 mg/dL (ref 65–99)
Potassium: 3.9 mmol/L (ref 3.5–5.3)
Sodium: 139 mmol/L (ref 135–146)
Total Bilirubin: 0.6 mg/dL (ref 0.2–1.2)
Total Protein: 7.1 g/dL (ref 6.1–8.1)

## 2020-07-20 ENCOUNTER — Ambulatory Visit: Payer: Medicare Other | Admitting: Family Medicine

## 2020-07-20 ENCOUNTER — Telehealth: Payer: Self-pay

## 2020-07-20 NOTE — Telephone Encounter (Signed)
Patient was seen on 07/07/20 by Kathrine Haddock NP.  Will forward this patient request to Malachy Mood to help share patient lab results to this patient.  Nobie Putnam, [facility] 07/20/2020, 11:48 AM

## 2020-07-20 NOTE — Telephone Encounter (Signed)
Please advise and I will give the results to the patient. Thank you

## 2020-07-20 NOTE — Telephone Encounter (Signed)
Pt. Is requesting labs results

## 2020-07-23 ENCOUNTER — Ambulatory Visit: Payer: Medicare Other | Admitting: Unknown Physician Specialty

## 2020-07-27 ENCOUNTER — Ambulatory Visit (INDEPENDENT_AMBULATORY_CARE_PROVIDER_SITE_OTHER): Payer: Medicare Other | Admitting: General Practice

## 2020-07-27 ENCOUNTER — Telehealth: Payer: Self-pay | Admitting: General Practice

## 2020-07-27 ENCOUNTER — Ambulatory Visit: Payer: Medicare Other | Admitting: Pharmacist

## 2020-07-27 DIAGNOSIS — E7800 Pure hypercholesterolemia, unspecified: Secondary | ICD-10-CM | POA: Diagnosis not present

## 2020-07-27 DIAGNOSIS — I10 Essential (primary) hypertension: Secondary | ICD-10-CM

## 2020-07-27 NOTE — Patient Instructions (Signed)
Visit Information  PATIENT GOALS: Goals Addressed            This Visit's Progress   . Pharmacy Goals       Please check your home blood pressure, keep a log of the results and bring this with you to your medical appointments.  Our goal bad cholesterol, or LDL, is less than 70 . This is why it is important to continue taking your atorvastatin  Feel free to call me with any questions or concerns. I look forward to our next call!  Harlow Asa, PharmD, [place] [phone]       The patient verbalized understanding of instructions, educational materials, and care plan provided today and declined offer to receive copy of patient instructions, educational materials, and care plan.   The patient has been provided with contact information for CM Pharmacist and has been advised to call with any medication related questions or concerns.  Harlow Asa, PharmD, [place] [phone]

## 2020-07-27 NOTE — Patient Instructions (Signed)
Visit Information  PATIENT GOALS: Goals Addressed            This Visit's Progress   . COMPLETED: RNCM: I am have difficulty swallowing       Current Barriers: Closing this goal. The patient is not having issues at this time with swallowing. Follows up the end of March in [place]. . Knowledge Deficits related to swallowing difficulties and how to obtain care for dysphagia  Nurse Case Manager Clinical Goal(s):  Marland Kitchen Over the next 120 days, patient will verbalize understanding of plan for seeing the new NP at [facility] for an appointment to evaluate and recommend treatment for issues the patient is having with swallowing- completed- the patient sees a specialist on May 4th . Over the next 90 days, patient will work with [facility] and pcp to address needs related to swallowing problems and referrals as needed to evaluate and treat swallowing issues  . Over the next 90 days, patient will attend all scheduled medical appointments: staff to call and make an appointment with the pcp to be seen- completed  . Over the next 90 days, the patient will demonstrate ongoing self health care management ability as evidenced by improved swallowing and no problems related to ineffective airway clearance   Interventions:  . Evaluation of current treatment plan related to GERD and patient's adherence to plan as established by provider. 02-24-2020: The patient is going on 04-02-2020 to specialist for evaluation of dysphagia and GERD. The patient states that they tried to do the light but was having a hard time with getting it to pass through.  They told her she did not need her esophagus stretched however she feels like they need to do something to help her.  She is being careful with eating and making sure she chews her food up good so she will not get choked. 05-18-2020: The patient had to cancel her appointment in November with the specialist and is now going on 06-10-2020.  She is hopeful that she will be able to go to this  appointment in [facility]. Denies any acute distress related to her throat but still needs to find answers to what is going on with her swallowing.  . Provided education to patient re: to process of obtaining appointment with pcp for evaluation, recommended the patient write down questions to ask the pcp. The patient has had her esophagus stretched x 2 in the past- completed. The patient has an upcoming appointment with a specialist for upcoming evaluation and treatment. 05-18-2020- the patient has an appointment on 06-10-2020 in [place] to see the specialist about her dysphagia  . Reviewed medications with patient and discussed compliance.  Patient endorses compliance and denies any concerns with medications. 02-24-2020: The patient is almost out of her omeprazole and ask if the pcp could call in a refill for omeprazole 40 mg.  Will send the pcp an in basket message and ask for assistance. 05-18-2020: The patient endorses compliance with medications  . Discussed plans with patient for ongoing care management follow up and provided patient with direct contact information for care management team . Provided patient with GERD educational materials related to swallowing difficulties  . Reviewed scheduled/upcoming provider appointments including: Patient has an appointment with the specialist on 06-10-2020 for evaluation and further treatment options, the evaluation is in [facility] with the specialist.   Patient Self Care Activities:  . Patient verbalizes understanding of plan to work with the CCM team to meet her health and  wellness needs  . Self administers medications as prescribed . Attends all scheduled provider appointments . Calls provider office for new concerns or questions . Unable to independently manage GERD signs and symptoms as evidence by difficulty swallowing   Please see past updates related to this goal by clicking on the "Past Updates" button in the selected goal      . COMPLETED:  RNCM: My blood pressure and cholesterol is better since I take medications       Current Barriers: Closing this goal and opening in new ELs . Chronic Disease Management support, education, and care coordination needs related to HTN and HLD  Clinical Goal(s) related to HTN and HLD:  Over the next 120 days, patient will:  . Work with the care management team to address educational, disease management, and care coordination needs  . Begin or continue self health monitoring activities as directed today Measure and record blood pressure 5 times per week . Call provider office for new or worsened signs and symptoms Blood pressure findings outside established parameters and New or worsened symptom related to HLD and other Chronic disease processes  . Call care management team with questions or concerns . Verbalize basic understanding of patient centered plan of care established today  Interventions related to HTN and HLD:  . Evaluation of current treatment plans and patient's adherence to plan as established by provider.  The patient states she is doing well with managing her blood pressure and cholesterol.  . Assessed patient understanding of disease states: Review of Chronic condtions and the patient states she feels she is doing well at this time. Has several upcoming appointments  . Assessed patient's education and care coordination needs: The patient states that she is still watching her diet.  Still has the swallowing issus but they are not worse. Will see specialist soon. The patient denies any new concerns related to her HTN or HLD.  Marland Kitchen Provided disease specific education to patient: Education on checking blood pressure, adhering to heart Healthy diet and calling provider for changes in condition. The patient is checking blood pressure regularly- last reading 123/80.  The patient endorses heart healthy diet and watching sodium content.  Nash Dimmer with appropriate clinical care team members  regarding patient needs.  Knows CCM team is here to help with health and wellness needs.   Patient Self Care Activities related to HTN and HLD:  . Patient is unable to independently self-manage chronic health conditions  Please see past updates related to this goal by clicking on the "Past Updates" button in the selected goal        Patient Care Plan: RNCM: Hypertension (Adult)    Problem Identified: RNCM: Hypertension (Hypertension)   Priority: Medium    Long-Range Goal: RNCM: Hypertension Monitored   Priority: Medium  Note:   Objective:  . Last practice recorded BP readings:  BP Readings from Last 3 Encounters:  07/07/20 122/69  04/28/20 132/77  04/17/20 130/74 .   Marland Kitchen Most recent eGFR/CrCl: No results found for: EGFR  No components found for: CRCL Current Barriers:  Marland Kitchen Knowledge Deficits related to basic understanding of hypertension pathophysiology and self care management . Knowledge Deficits related to understanding of medications prescribed for management of hypertension . Difficulty obtaining medications . Limited Social Support . Lacks social connections . Does not maintain contact with provider office . Does not contact provider office for questions/concerns Case Manager Clinical Goal(s):  Marland Kitchen Over the next 120 days, patient will verbalize understanding of  plan for hypertension management . Over the next 120 days, patient will attend all scheduled medical appointments: 12-01-2020 at 0800 am . Over the next 120 days, patient will demonstrate improved adherence to prescribed treatment plan for hypertension as evidenced by taking all medications as prescribed, monitoring and recording blood pressure as directed, adhering to low sodium/DASH diet . Over the next 120 days, patient will demonstrate improved health management independence as evidenced by checking blood pressure as directed and notifying PCP if SBP>160 or DBP > 90, taking all medications as prescribe, and adhering to  a low sodium diet as discussed. . Over the next 120 days, patient will verbalize basic understanding of hypertension disease process and self health management plan as evidenced by compliance with heart healthy diet, compliance with medications, and working with CCM team to manage health and well being  Interventions:  . Collaboration with Malfi, Lupita Raider, FNP regarding development and update of comprehensive plan of care as evidenced by provider attestation and co-signature . Inter-disciplinary care team collaboration (see longitudinal plan of care) . Evaluation of current treatment plan related to hypertension self management and patient's adherence to plan as established by provider. . Provided education to patient re: stroke prevention, s/s of heart attack and stroke, DASH diet, complications of uncontrolled blood pressure . Reviewed medications with patient and discussed importance of compliance . Discussed plans with patient for ongoing care management follow up and provided patient with direct contact information for care management team . Advised patient, providing education and rationale, to monitor blood pressure daily and record, calling PCP for findings outside established parameters.  . Reviewed scheduled/upcoming provider appointments including: 12-01-2020 at 0800 am Self-Care Activities: - Self administers medications as prescribed Attends all scheduled provider appointments Calls provider office for new concerns, questions, or BP outside discussed parameters Checks BP and records as discussed Follows a low sodium diet/DASH diet Patient Goals: - check blood pressure weekly - choose a place to take my blood pressure (home, clinic or office, retail store) - write blood pressure results in a log or diary - agree on reward when goals are met - agree to work together to make changes - ask questions to understand - have a family meeting to talk about healthy habits - learn about high  blood pressure - blood pressure trends reviewed - depression screen reviewed - home or ambulatory blood pressure monitoring encouraged  Follow Up Plan: Telephone follow up appointment with care management team member scheduled for: 09-28-2020 at 1030 am   Task: RNCM: Identify and Monitor Blood Pressure Elevation   Note:   Care Management Activities:    - blood pressure trends reviewed - depression screen reviewed - home or ambulatory blood pressure monitoring encouraged       Patient Care Plan: RNCM: HLD Management    Problem Identified: RNCM: HLD Management   Priority: Medium    Long-Range Goal: RNCM: HLD Management   Priority: Medium  Note:   Current Barriers:  . Poorly controlled hyperlipidemia, complicated by HTN . Current antihyperlipidemic regimen: atorvastatin 20 mg QD . Most recent lipid panel:     Component Value Date/Time   CHOL 194 07/07/2020 0829   TRIG 96 07/07/2020 0829   HDL 66 07/07/2020 0829   CHOLHDL 2.9 07/07/2020 0829   LDLCALC 109 (H) 07/07/2020 0829 .   Marland Kitchen ASCVD risk enhancing conditions: age >11, HTN . Unable to independently manage HLD  . Lacks social connections . Does not maintain contact with provider office . Does  not contact provider office for questions/concerns RN Care Manager Clinical Goal(s):  . patient will work with Consulting civil engineer, providers, and care team towards execution of optimized self-health management plan . patient will verbalize understanding of plan for effective management of HLD  . patient will work with [facility], CCM team and pcp  to address needs related to refill needs for medications  . patient will attend all scheduled medical appointments: 12-01-2020 at 0800 am . patient will demonstrate improved adherence to prescribed treatment plan for HLD as evidenced bytaking medications as prescribed, heart healthy diet and working with CCM team to optimize health and well being  . patient will work with CM team pharmacist to get  needed refills  Interventions: . Collaboration with Malfi, Lupita Raider, FNP regarding development and update of comprehensive plan of care as evidenced by provider attestation and co-signature . Inter-disciplinary care team collaboration (see longitudinal plan of care) . Medication review performed; medication list updated in electronic medical record.  Bertram Savin care team collaboration (see longitudinal plan of care) . Referred to pharmacy team for assistance with HLD medication management . Evaluation of current treatment plan related to HLD and patient's adherence to plan as established by provider. . Advised patient to call the office for changes or questions  . Provided education to patient re: heart healthy diet, monitoring for weight changes, and working with the CCM team to manage health and well being.  Marland Kitchen Collaborated with pcp and pharm D regarding patients stated need for refills. She had called the pharmacy but they told her she did not have refills available for atorvastatin 20 mg or omeprazole 40 mg. States she has a one week supply.  . Reviewed scheduled/upcoming provider appointments including: 12-01-2020 at 0800 am . Pharmacy referral for assistance with medication refill needs  . Discussed plans with patient for ongoing care management follow up and provided patient with direct contact information for care management team Patient Goals/Self-Care Activities: - call for medicine refill 2 or 3 days before it runs out - call if I am sick and can't take my medicine - keep a list of all the medicines I take; vitamins and herbals too - learn to read medicine labels - use a pillbox to sort medicine - use an alarm clock or phone to remind me to take my medicine - change to whole grain breads, cereal, pasta - drink 6 to 8 glasses of water each day - eat 3 to 5 servings of fruits and vegetables each day - eat 5 or 6 small meals each day - fill half the plate with nonstarchy  vegetables - limit fast food meals to no more than 1 per week - manage portion size - prepare main meal at home 3 to 5 days each week - read food labels for fat, fiber, carbohydrates and portion size - be open to making changes - I can manage, know and watch for signs of a heart attack - if I have chest pain, call for help - learn about small changes that will make a big difference - learn my personal risk factors  - barriers to meeting goals identified - change-talk evoked - choices provided - collaboration with team encouraged - decision-making supported - difficulty of making life-long changes acknowledged - health risks reviewed - problem-solving facilitated - questions answered - readiness for change evaluated - reassurance provided - self-reflection promoted - self-reliance encouraged Follow Up Plan: Telephone follow up appointment with care management team member scheduled for:09-28-2020 at  1030 am     Task: RNCM: HLD management   Note:   Care Management Activities:    - barriers to meeting goals identified - change-talk evoked - choices provided - collaboration with team encouraged - decision-making supported - difficulty of making life-long changes acknowledged - health risks reviewed - problem-solving facilitated - questions answered - readiness for change evaluated - reassurance provided - self-reflection promoted - self-reliance encouraged         The patient verbalized understanding of instructions, educational materials, and care plan provided today and declined offer to receive copy of patient instructions, educational materials, and care plan.   Telephone follow up appointment with care management team member scheduled for: 09-28-2020 at 52 am  Noreene Larsson RN, MSN, [facility] Mobile: [phone]

## 2020-07-27 NOTE — Chronic Care Management (AMB) (Signed)
Chronic Care Management   CCM RN Visit Note  07/27/2020 Name: Tracey Carroll MRN: [id number] DOB: 1940-09-25  Subjective: Tracey Carroll is a 80 y.o. year old female who is a primary care patient of Lorine Bears, Lupita Raider, FNP. The care management team was consulted for assistance with disease management and care coordination needs.    Engaged with patient by telephone for follow up visit in response to provider referral for case management and/or care coordination services.   Consent to Services:  The patient was given information about Chronic Care Management services, agreed to services, and gave verbal consent prior to initiation of services.  Please see initial visit note for detailed documentation.   Patient agreed to services and verbal consent obtained.   Assessment: Review of patient past medical history, allergies, medications, health status, including review of consultants reports, laboratory and other test data, was performed as part of comprehensive evaluation and provision of chronic care management services.   SDOH (Social Determinants of Health) assessments and interventions performed:    CCM Care Plan  No Known Allergies  Outpatient Encounter Medications as of 07/27/2020  Medication Sig   atorvastatin (LIPITOR) 20 MG tablet TAKE 1 TABLET BY MOUTH EVERY OTHER DAY   conjugated estrogens (PREMARIN) vaginal cream Apply 0.21m (pea-sized amount)  just inside the vaginal introitus with a finger-tip on  Monday, Wednesday and Friday nights.   hydrochlorothiazide (HYDRODIURIL) 12.5 MG tablet Take 1 tablet (12.5 mg total) by mouth daily.   losartan (COZAAR) 50 MG tablet Take 1 tablet (50 mg total) by mouth daily.   omeprazole (PRILOSEC) 40 MG capsule Take 1 capsule (40 mg total) by mouth 2 (two) times daily.   No facility-administered encounter medications on file as of 07/27/2020.    Patient Active Problem List   Diagnosis Date Noted   Dry eye 10/18/2019   Gastric polyp     Pure hypercholesterolemia 06/13/2017   Aortic atherosclerosis [place] 03/13/2017   Vaginal itching 06/04/2015   Essential hypertension 03/25/2015   ACE-inhibitor cough 03/25/2015   Obesity 12/19/2014   Swallowing difficulty    Hiatal hernia    Stricture and stenosis of esophagus    Dysphagia 10/20/2014   Elevated BP 10/20/2014   Back pain, chronic 10/19/2014   Insomnia, persistent 10/19/2014   Gastroesophageal reflux disease 10/19/2014   Blood in the urine 10/19/2014   LBP (low back pain) 10/19/2014   FOM (frequency of micturition) 10/19/2014    Conditions to be addressed/monitored:HTN and HLD  Care Plan : RNCM: Hypertension (Adult)  Updates made by TVanita Inglessince 07/27/2020 12:00 AM    Problem: RNCM: Hypertension (Hypertension)   Priority: Medium    Long-Range Goal: RNCM: Hypertension Monitored   Priority: Medium  Note:   Objective:   Last practice recorded BP readings:  BP Readings from Last 3 Encounters:  07/07/20 122/69  04/28/20 132/77  04/17/20 130/74     Most recent eGFR/CrCl: No results found for: EGFR  No components found for: CRCL Current Barriers:   Knowledge Deficits related to basic understanding of hypertension pathophysiology and self care management  Knowledge Deficits related to understanding of medications prescribed for management of hypertension  Difficulty obtaining medications  Limited Social Support  Lacks social connections  Does not maintain contact with provider office  Does not contact provider office for questions/concerns Case Manager Clinical Goal(s):   Over the next 120 days, patient will verbalize understanding of plan for hypertension management  Over the next 120 days, patient will  attend all scheduled medical appointments: 12-01-2020 at 0800 am  Over the next 120 days, patient will demonstrate improved adherence to prescribed treatment plan for hypertension as evidenced by taking all medications as  prescribed, monitoring and recording blood pressure as directed, adhering to low sodium/DASH diet  Over the next 120 days, patient will demonstrate improved health management independence as evidenced by checking blood pressure as directed and notifying PCP if SBP>160 or DBP > 90, taking all medications as prescribe, and adhering to a low sodium diet as discussed.  Over the next 120 days, patient will verbalize basic understanding of hypertension disease process and self health management plan as evidenced by compliance with heart healthy diet, compliance with medications, and working with CCM team to manage health and well being  Interventions:   Collaboration with Malfi, Lupita Raider, FNP regarding development and update of comprehensive plan of care as evidenced by provider attestation and co-signature  Inter-disciplinary care team collaboration (see longitudinal plan of care)  Evaluation of current treatment plan related to hypertension self management and patient's adherence to plan as established by provider.  Provided education to patient re: stroke prevention, s/s of heart attack and stroke, DASH diet, complications of uncontrolled blood pressure  Reviewed medications with patient and discussed importance of compliance  Discussed plans with patient for ongoing care management follow up and provided patient with direct contact information for care management team  Advised patient, providing education and rationale, to monitor blood pressure daily and record, calling PCP for findings outside established parameters.   Reviewed scheduled/upcoming provider appointments including: 12-01-2020 at 0800 am Self-Care Activities: - Self administers medications as prescribed Attends all scheduled provider appointments Calls provider office for new concerns, questions, or BP outside discussed parameters Checks BP and records as discussed Follows a low sodium diet/DASH diet Patient Goals: - check  blood pressure weekly - choose a place to take my blood pressure (home, clinic or office, retail store) - write blood pressure results in a log or diary - agree on reward when goals are met - agree to work together to make changes - ask questions to understand - have a family meeting to talk about healthy habits - learn about high blood pressure - blood pressure trends reviewed - depression screen reviewed - home or ambulatory blood pressure monitoring encouraged  Follow Up Plan: Telephone follow up appointment with care management team member scheduled for: 09-28-2020 at 1030 am   Task: RNCM: Identify and Monitor Blood Pressure Elevation   Note:   Care Management Activities:    - blood pressure trends reviewed - depression screen reviewed - home or ambulatory blood pressure monitoring encouraged       Care Plan : RNCM: HLD Management  Updates made by Vanita Ingles since 07/27/2020 12:00 AM    Problem: RNCM: HLD Management   Priority: Medium    Long-Range Goal: RNCM: HLD Management   Priority: Medium  Note:   Current Barriers:   Poorly controlled hyperlipidemia, complicated by HTN  Current antihyperlipidemic regimen: atorvastatin 20 mg QD  Most recent lipid panel:     Component Value Date/Time   CHOL 194 07/07/2020 0829   TRIG 96 07/07/2020 0829   HDL 66 07/07/2020 0829   CHOLHDL 2.9 07/07/2020 0829   LDLCALC 109 (H) 07/07/2020 0829     ASCVD risk enhancing conditions: age >46, HTN  Unable to independently manage HLD   Lacks social connections  Does not maintain contact with provider office  Does not contact  provider office for questions/concerns RN Care Manager Clinical Goal(s):   patient will work with [place], providers, and care team towards execution of optimized self-health management plan  patient will verbalize understanding of plan for effective management of HLD   patient will work with [facility], CCM team and pcp  to address needs related  to refill needs for medications   patient will attend all scheduled medical appointments: 12-01-2020 at 0800 am  patient will demonstrate improved adherence to prescribed treatment plan for HLD as evidenced bytaking medications as prescribed, heart healthy diet and working with CCM team to optimize health and well being   patient will work with CM team pharmacist to get needed refills  Interventions:  Collaboration with Malfi, Lupita Raider, FNP regarding development and update of comprehensive plan of care as evidenced by provider attestation and co-signature  Inter-disciplinary care team collaboration (see longitudinal plan of care)  Medication review performed; medication list updated in electronic medical record.   Inter-disciplinary care team collaboration (see longitudinal plan of care)  Referred to pharmacy team for assistance with HLD medication management  Evaluation of current treatment plan related to HLD and patient's adherence to plan as established by provider.  Advised patient to call the office for changes or questions   Provided education to patient re: heart healthy diet, monitoring for weight changes, and working with the CCM team to manage health and well being.   Collaborated with pcp and pharm D regarding patients stated need for refills. She had called the pharmacy but they told her she did not have refills available for atorvastatin 20 mg or omeprazole 40 mg. States she has a one week supply.   Reviewed scheduled/upcoming provider appointments including: 12-01-2020 at 0800 am  Pharmacy referral for assistance with medication refill needs   Discussed plans with patient for ongoing care management follow up and provided patient with direct contact information for care management team Patient Goals/Self-Care Activities: - call for medicine refill 2 or 3 days before it runs out - call if I am sick and can't take my medicine - keep a list of all the medicines I take;  vitamins and herbals too - learn to read medicine labels - use a pillbox to sort medicine - use an alarm clock or phone to remind me to take my medicine - change to whole grain breads, cereal, pasta - drink 6 to 8 glasses of water each day - eat 3 to 5 servings of fruits and vegetables each day - eat 5 or 6 small meals each day - fill half the plate with nonstarchy vegetables - limit fast food meals to no more than 1 per week - manage portion size - prepare main meal at home 3 to 5 days each week - read food labels for fat, fiber, carbohydrates and portion size - be open to making changes - I can manage, know and watch for signs of a heart attack - if I have chest pain, call for help - learn about small changes that will make a big difference - learn my personal risk factors  - barriers to meeting goals identified - change-talk evoked - choices provided - collaboration with team encouraged - decision-making supported - difficulty of making life-long changes acknowledged - health risks reviewed - problem-solving facilitated - questions answered - readiness for change evaluated - reassurance provided - self-reflection promoted - self-reliance encouraged Follow Up Plan: Telephone follow up appointment with care management team member scheduled for:09-28-2020 at 44 am  Task: RNCM: HLD management   Note:   Care Management Activities:    - barriers to meeting goals identified - change-talk evoked - choices provided - collaboration with team encouraged - decision-making supported - difficulty of making life-long changes acknowledged - health risks reviewed - problem-solving facilitated - questions answered - readiness for change evaluated - reassurance provided - self-reflection promoted - self-reliance encouraged         Plan:Telephone follow up appointment with care management team member scheduled for:  09-28-2020 at 41 am  [place], MSN, [place] Mobile: [phone]

## 2020-07-27 NOTE — Chronic Care Management (AMB) (Signed)
Chronic Care Management Pharmacy Note  07/27/2020 Name:  Tracey Carroll MRN:  [id number] DOB:  1940-12-25  Subjective: Tracey Carroll is an 80 y.o. year old female who is a primary patient of Malfi, Lupita Raider, FNP.  The CCM team was consulted for assistance with disease management and care coordination needs.    Engaged with patient by telephone for initial visit in response to provider referral for pharmacy case management and/or care coordination services.   Consent to Services:  The patient was given information about Chronic Care Management services, agreed to services, and gave verbal consent prior to initiation of services.  Please see initial visit note for detailed documentation.   Patient Care Team: Verl Bangs, FNP as PCP - General (Family Medicine) Vanita Ingles, RN as Case Manager ([place]) Jackie Russman, Virl Diamond, [facility] as Pharmacist  Recent office visits: Office Visit with Kathrine Haddock, NP on 07/07/2020  Hospital visits: None in previous 6 months  Objective:  Lab Results  Component Value Date   CREATININE 0.82 07/07/2020   CREATININE 0.72 08/30/2019   CREATININE 0.69 03/05/2019    Lab Results  Component Value Date   HGBA1C 5.2 09/11/2017    Hepatic Function Latest Ref Rng & Units 07/07/2020 08/30/2019 03/05/2019  Total Protein 6.1 - 8.1 g/dL 7.1 7.1 7.2  Albumin 3.6 - 5.1 g/dL - - -  AST 10 - 35 U/L _0 ALT 6 - 29 U/L _1 Alk Phosphatase 33 - 130 U/L - - -  Total Bilirubin 0.2 - 1.2 mg/dL 0.6 0.6 0.7    Social History   Tobacco Use  Smoking Status Never Smoker  Smokeless Tobacco Never Used   BP Readings from Last 3 Encounters:  07/07/20 122/69  04/28/20 132/77  04/17/20 130/74   Pulse Readings from Last 3 Encounters:  07/07/20 73  04/28/20 88  04/17/20 74   Wt Readings from Last 3 Encounters:  07/07/20 184 lb 12.8 oz (83.8 kg)  04/28/20 188 lb 1.6 oz (85.3 kg)  04/17/20 187 lb (84.8 kg)    Assessment: Review of  patient past medical history, allergies, medications, health status, including review of consultants reports, laboratory and other test data, was performed as part of comprehensive evaluation and provision of chronic care management services.   SDOH:  (Social Determinants of Health) assessments and interventions performed: none   CCM Care Plan  No Known Allergies  Medications Reviewed Today    Reviewed by Vella Raring, [place] (Pharmacist) on 07/27/20 at 1600  Med List Status: <None>  Medication Order Taking? Sig Documenting Provider Last Dose Status Informant  atorvastatin (LIPITOR) 20 MG tablet [id number] Yes TAKE 1 TABLET BY MOUTH EVERY OTHER DAY Malfi, Lupita Raider, FNP Taking Active   conjugated estrogens (PREMARIN) vaginal cream [id number]  Apply 0.2m (pea-sized amount)  just inside the vaginal introitus with a finger-tip on  Monday, Wednesday and Friday nights. MZara CouncilA, PA-C  Active   hydrochlorothiazide (HYDRODIURIL) 12.5 MG tablet [id number] Take 1 tablet (12.5 mg total) by mouth daily. WKathrine Haddock NP Taking Active   losartan (COZAAR) 50 MG tablet [id number] Take 1 tablet (50 mg total) by mouth daily. MVerl Bangs FNP Taking Active   omeprazole (PRILOSEC) 40 MG capsule [id number] Take 1 capsule (40 mg total) by mouth 2 (two) times daily. Malfi, NLupita Raider FNP Taking Active           Patient Active Problem List  Diagnosis Date Noted  . Dry eye 10/18/2019  . Gastric polyp   . Pure hypercholesterolemia 06/13/2017  . Aortic atherosclerosis ([place]) 03/13/2017  . Vaginal itching 06/04/2015  . Essential hypertension 03/25/2015  . ACE-inhibitor cough 03/25/2015  . Obesity 12/19/2014  . Swallowing difficulty   . Hiatal hernia   . Stricture and stenosis of esophagus   . Dysphagia 10/20/2014  . Elevated BP 10/20/2014  . Back pain, chronic 10/19/2014  . 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 Gastroesophageal reflux disease 10/19/2014  . Blood in the urine  10/19/2014  . LBP (low back pain) 10/19/2014  . FOM (frequency of micturition) 10/19/2014    Immunization History  Administered Date(s) Administered  . Influenza, High Dose Seasonal PF 01/22/2015, 03/21/2016, 01/17/2017, 03/06/2018, 02/12/2019  . Influenza-Unspecified 04/14/2014, 04/14/2014  . Moderna Sars-Covid-2 Vaccination 12/30/2019, 01/28/2020  . Pneumococcal Conjugate-13 12/18/2013  . Pneumococcal Polysaccharide-23 04/16/2012, 12/19/2014  . Tdap 08/08/2013, 08/08/2013    Conditions to be addressed/monitored: HTN and HLD  Care Plan : PharmD - Medication Mgmt  Updates made by Vella Raring, [place] since 07/27/2020 12:00 AM    Problem: Disease Progression     Goal: Disease Progression Prevented or Minimized Completed 07/27/2020  Start Date: 07/27/2020  Expected End Date: 07/27/2020  This Visit's Progress: On track  Priority: High  Note:   Current Barriers:  . Reported unable to refill prescriptions through [facility] due to "needing authorization"  Pharmacist Clinical Goal(s):  Marland Kitchen Over the next 30 days, patient will verbalize ability to obtain treatment regimen through collaboration with PharmD and provider.   Interventions: . 1:1 collaboration with Olin Hauser, DO regarding development and update of comprehensive plan of care as evidenced by provider attestation and co-signature . Inter-disciplinary care team collaboration (see longitudinal plan of care) . Receive coordination of care message from CCM Nurse Case Manager. Reports patient recently called pharmacy for refills of atorvastatin and omeprazole and was advised that authorization was needed. o Based on review of formulary for patient's [employer] Dual complete coverage from health plan website, both atorvastatin and omeprazole are covered under patient's plan as tier 1 options . Collaborate with Ebony Hail at [employer]. Determine that patient's atorvastatin Rx can be refilled today for $0  copayment, but omeprazole Rx is too soon to refill until 3/20. . Follow up with patient to let her know that atorvastatin can be refilled and counsel patient to call pharmacy on 3/20 to have omeprazole refilled o Patient verbalizes understanding and confirms having at least a 1 week supply of both medications remaining . Comprehensive medication review performed; medication list updated in electronic medical record  Hypertension: . Controlled; current treatment: o Losartan 50 mg daily o HCTZ 12.5 mg daily . Unable to review recent home readings as patient reports does not have log with her now . Denies/reports hypotensive/hypertensive symptoms . Encourage patient to continue to monitor home blood pressure, keep log of results and bring this record with her to medical appointments  COVID-19 Prevention: . Counsel patient on how to obtain COVID-19 booster  Patient Goals/Self-Care Activities . Over the next 30 days, patient will:  - take medications as prescribed - check blood pressure, document, and provide at future appointments  Follow Up Plan: The patient has been provided with contact information for CM Pharmacist and has been advised to call with any medication related questions or concerns.    Patient's preferred pharmacy is:  [facility] (N), [place] - [address]  [address] ([place]) [place] [address] Phone: [phone] Fax: [facility] - [place], [place] [address] Phone: [phone] Fax: [phone]  Uses pill box? Yes  Follow Up:  Patient requests no follow-up at this time.  Plan:The patient has been provided with contact information for CM Pharmacist and has been advised to call with any medication related questions or concerns.  Harlow Asa, PharmD, [place] [phone]

## 2020-08-07 DIAGNOSIS — R1312 Dysphagia, oropharyngeal phase: Secondary | ICD-10-CM | POA: Diagnosis not present

## 2020-08-07 DIAGNOSIS — R633 Feeding difficulties, unspecified: Secondary | ICD-10-CM | POA: Diagnosis not present

## 2020-08-13 DIAGNOSIS — R1310 Dysphagia, unspecified: Secondary | ICD-10-CM | POA: Diagnosis not present

## 2020-09-28 ENCOUNTER — Telehealth: Payer: Self-pay

## 2020-09-28 ENCOUNTER — Telehealth: Payer: Self-pay | Admitting: General Practice

## 2020-09-28 NOTE — Telephone Encounter (Signed)
  Care Management   Follow Up Note   09/28/2020 Name: Tracey Carroll MRN: [id number] DOB: [birth year]   Referred by: Verl Bangs, FNP Reason for referral : Chronic Care Management (RNCM: Follow up for Chronic Disease Management and Care Coordination Needs )   An unsuccessful telephone outreach was attempted today. The patient was referred to the case management team for assistance with care management and care coordination.   Follow Up Plan: A HIPPA compliant phone message was left for the patient providing contact information and requesting a return call.   Noreene Larsson RN, MSN, [place] Mobile: [phone]

## 2020-09-30 ENCOUNTER — Other Ambulatory Visit: Payer: Self-pay

## 2020-09-30 DIAGNOSIS — I10 Essential (primary) hypertension: Secondary | ICD-10-CM

## 2020-09-30 MED ORDER — HYDROCHLOROTHIAZIDE 12.5 MG PO TABS: 12.5000 mg | ORAL_TABLET | Freq: Every day | ORAL | 0 refills | Status: DC

## 2020-09-30 NOTE — Telephone Encounter (Signed)
   Notes to clinic:Medication last filled by Kathrine Haddock  Patient has not seen new provider yet   Requested Prescriptions  Pending Prescriptions Disp Refills   hydrochlorothiazide (HYDRODIURIL) 12.5 MG tablet 90 tablet 0    Sig: Take 1 tablet (12.5 mg total) by mouth daily.      Cardiovascular: Diuretics - Thiazide Passed - 09/30/2020  8:44 AM      Passed - Ca in normal range and within 360 days    Calcium  Date Value Ref Range Status  07/07/2020 9.6 8.6 - 10.4 mg/dL Final          Passed - Cr in normal range and within 360 days    Creat  Date Value Ref Range Status  07/07/2020 0.82 0.60 - 0.93 mg/dL Final    Comment:    For patients >108 years of age, the reference limit for Creatinine is approximately 13% higher for people identified as African-American. .           Passed - K in normal range and within 360 days    Potassium  Date Value Ref Range Status  07/07/2020 3.9 3.5 - 5.3 mmol/L Final          Passed - Na in normal range and within 360 days    Sodium  Date Value Ref Range Status  07/07/2020 139 135 - 146 mmol/L Final          Passed - Last BP in normal range    BP Readings from Last 1 Encounters:  07/07/20 122/69          Passed - Valid encounter within last 6 months    Recent Outpatient Visits           2 months ago Need for hepatitis C screening test   [place], NP   5 months ago Essential hypertension   [place], FNP   11 months ago Essential hypertension   [place], FNP   1 year ago Obesity, morbid, BMI 40.0-49.9 ([place])   [facility], Lupita Raider, FNP   1 year ago Mixed hyperlipidemia   [place], Devonne Doughty, DO       Future Appointments             In 3 weeks  [facility], [place]   In 7 months [place], [facility]

## 2020-10-02 ENCOUNTER — Telehealth: Payer: Self-pay

## 2020-10-02 NOTE — Chronic Care Management (AMB) (Signed)
  Care Management   Note  10/02/2020 Name: Tracey Carroll MRN: [id number] DOB: Jun 08, 1940  Tracey Carroll is a 79 y.o. year old female who is a primary care patient of Lorine Bears, Lupita Raider, FNP and is actively engaged with the care management team. I reached out to Tracey Carroll by phone today to assist with re-scheduling a follow up visit with the RN Case Manager  Follow up plan: Unsuccessful telephone outreach attempt made. The care management team will reach out to the patient again over the next 7 days.  If patient returns call to provider office, please advise to call [facility]  at [place], [place], [place], [place] [address] Direct Dial: [phone] [email or web address]: [email or web address]

## 2020-10-08 NOTE — Chronic Care Management (AMB) (Signed)
  Care Management   Note  10/08/2020 Name: Tracey Carroll MRN: [id number] DOB: [birth year]  Tracey Carroll is a 80 y.o. year old female who is a primary care patient of Lorine Bears, Lupita Raider, FNP and is actively engaged with the care management team. I reached out to Tracey Carroll by phone today to assist with re-scheduling a follow up visit with the RN Case Manager  Follow up plan: Unsuccessful telephone outreach attempt made.The care management team will reach out to the patient again over the next 7 days.  If patient returns call to provider office, please advise to call [facility]  at [place], [place], [place], [place] [address] Direct Dial: [phone] [email or web address]: [email or web address]

## 2020-10-15 NOTE — Chronic Care Management (AMB) (Signed)
  Care Management   Note  10/15/2020 Name: [name] MRN: [id number] DOB: 1941/03/04  Tracey Carroll is a 79 y.o. year old female who is a primary care patient of Lorine Bears, Lupita Raider, FNP and is actively engaged with the care management team. I reached out to Tracey Carroll by phone today to assist with re-scheduling a follow up visit with the RN Case Manager  Follow up plan: Unable to make contact on outreach attempts x 3. PCP Malfi, Lupita Raider, FNP notified via routed documentation in medical record.   Noreene Larsson, [place], [place], [place] [address] Direct Dial: [phone] [email or web address]: [email or web address]

## 2020-10-15 NOTE — Telephone Encounter (Signed)
3rd unsuccessful outreach  

## 2020-10-27 ENCOUNTER — Other Ambulatory Visit: Payer: Self-pay | Admitting: Internal Medicine

## 2020-10-27 ENCOUNTER — Ambulatory Visit (INDEPENDENT_AMBULATORY_CARE_PROVIDER_SITE_OTHER): Payer: Medicare Other

## 2020-10-27 VITALS — Ht 59.0 in | Wt 184.0 lb

## 2020-10-27 DIAGNOSIS — I10 Essential (primary) hypertension: Secondary | ICD-10-CM

## 2020-10-27 DIAGNOSIS — Z0000 Encounter for general adult medical examination without abnormal findings: Secondary | ICD-10-CM | POA: Diagnosis not present

## 2020-10-27 MED ORDER — LOSARTAN POTASSIUM 50 MG PO TABS: 50.0000 mg | ORAL_TABLET | Freq: Every day | ORAL | 0 refills | Status: DC

## 2020-10-27 NOTE — Progress Notes (Signed)
I connected with Tracey Carroll today by telephone and verified that I am speaking with the correct person using two identifiers. Location patient: home Location provider: work Persons participating in the virtual visit: Tracey Carroll, Treat LPN.   I discussed the limitations, risks, security and privacy concerns of performing an evaluation and management service by telephone and the availability of in person appointments. I also discussed with the patient that there may be a patient responsible charge related to this service. The patient expressed understanding and verbally consented to this telephonic visit.    Interactive audio and video telecommunications were attempted between this provider and patient, however failed, due to patient having technical difficulties OR patient did not have access to video capability.  We continued and completed visit with audio only.     Vital signs may be patient reported or missing.  Subjective:   Tracey Carroll is a 80 y.o. female who presents for Medicare Annual (Subsequent) preventive examination.  Review of Systems     Cardiac Risk Factors include: advanced age (>24men, >44 women);hypertension;obesity (BMI >30kg/m2);sedentary lifestyle     Objective:    Today's Vitals   10/27/20 0816  Weight: 184 lb (83.5 kg)  Height: 4\' 11"  (1.499 m)   Body mass index is 37.16 kg/m.  Advanced Directives 10/27/2020 10/22/2019 09/17/2019 03/06/2018 01/17/2017 04/08/2015 11/20/2014  Does Patient Have a Medical Advance Directive? No No No No No No No  Does patient want to make changes to medical advance directive? - - - - Yes (MAU/Ambulatory/Procedural Areas - Information given) - -  Would patient like information on creating a medical advance directive? - No - Patient declined - Yes (MAU/Ambulatory/Procedural Areas - Information given) - No - patient declined information Yes - Educational materials given    Current Medications (verified) Outpatient  Encounter Medications as of 10/27/2020  Medication Sig   atorvastatin (LIPITOR) 20 MG tablet TAKE 1 TABLET BY MOUTH EVERY OTHER DAY   conjugated estrogens (PREMARIN) vaginal cream Apply 0.5mg  (pea-sized amount)  just inside the vaginal introitus with a finger-tip on  Monday, Wednesday and Friday nights.   hydrochlorothiazide (HYDRODIURIL) 12.5 MG tablet Take 1 tablet (12.5 mg total) by mouth daily.   losartan (COZAAR) 50 MG tablet Take 1 tablet (50 mg total) by mouth daily.   omeprazole (PRILOSEC) 40 MG capsule Take 1 capsule (40 mg total) by mouth 2 (two) times daily.   No facility-administered encounter medications on file as of 10/27/2020.    Allergies (verified) Patient has no known allergies.   History: Past Medical History:  Diagnosis Date   Acid reflux 10/19/2014   Arthritis    arms   Back pain, chronic 10/19/2014   Dysphagia 10/20/2014   Elevated BP 10/20/2014   Fibroids, intramural 10/19/2014   FOM (frequency of micturition) 10/19/2014   GERD (gastroesophageal reflux disease)    Hyperlipidemia    Hypertension    Insomnia, persistent 10/19/2014   LBP (low back pain) 10/19/2014   Past Surgical History:  Procedure Laterality Date   ESOPHAGOGASTRODUODENOSCOPY (EGD) WITH PROPOFOL N/A 11/20/2014   Procedure: ESOPHAGOGASTRODUODENOSCOPY (EGD) WITH PROPOFOL with dilation;  Surgeon: Lucilla Lame, MD;  Location: [facility];  Service: Endoscopy;  Laterality: N/A;   ESOPHAGOGASTRODUODENOSCOPY (EGD) WITH PROPOFOL N/A 09/17/2019   Procedure: ESOPHAGOGASTRODUODENOSCOPY (EGD) WITH PROPOFOL;  Surgeon: Virgel Manifold, MD;  Location: [facility];  Service: Endoscopy;  Laterality: N/A;   TUBAL LIGATION     Family History  Problem Relation Age of Onset   Diabetes  Father        in her 49 and 42s   Hypertension Daughter        her 45s   Kidney cancer Neg Hx    Bladder Cancer Neg Hx    Social History   Socioeconomic History   Marital status: Single    Spouse name: Not on file   Number  of children: Not on file   Years of education: Not on file   Highest education level: 12th grade  Occupational History   Occupation: retired  Tobacco Use   Smoking status: Never   Smokeless tobacco: Never  Vaping Use   Vaping Use: Never used  Substance and Sexual Activity   Alcohol use: No    Alcohol/week: 0.0 standard drinks   Drug use: No   Sexual activity: Not on file  Other Topics Concern   Not on file  Social History Narrative   ** Merged History Encounter **       Social Determinants of Health   Financial Resource Strain: Low Risk    Difficulty of Paying Living Expenses: Not hard at all  Food Insecurity: No Food Insecurity   Worried About Charity fundraiser in the Last Year: Never true   [place] in the Last Year: Never true  Transportation Needs: No Transportation Needs   Lack of Transportation (Medical): No   Lack of Transportation (Non-Medical): No  Physical Activity: Inactive   Days of Exercise per Week: 0 days   Minutes of Exercise per Session: 0 min  Stress: No Stress Concern Present   Feeling of Stress : Not at all  Social Connections: Not on file    Tobacco Counseling Counseling given: Not Answered   Clinical Intake:  Pre-visit preparation completed: Yes  Pain : No/denies pain     Nutritional Status: BMI > 30  Obese Nutritional Risks: None Diabetes: No  How often do you need to have someone help you when you read instructions, pamphlets, or other written materials from your doctor or pharmacy?: 1 - Never What is the last grade level you completed in school?: 12th grade  Diabetic? no  Interpreter Needed?: No  Information entered by :: NAllen LPN   Activities of Daily Living In your present state of health, do you have any difficulty performing the following activities: 10/27/2020  Hearing? N  Vision? N  Difficulty concentrating or making decisions? N  Walking or climbing stairs? N  Dressing or bathing? N  Doing errands,  shopping? N  Preparing Food and eating ? N  Using the Toilet? N  In the past six months, have you accidently leaked urine? N  Do you have problems with loss of bowel control? N  Managing your Medications? N  Managing your Finances? N  Housekeeping or managing your Housekeeping? N  Some recent data might be hidden    Patient Care Team: Malfi, Lupita Raider, FNP as PCP - General (Family Medicine) Vanita Ingles, RN as Case Manager ([place]) Dhalla, Virl Diamond, RPH-CPP as Pharmacist  Indicate any recent Medical Services you may have received from other than [facility] providers in the past year (date may be approximate).     Assessment:   This is a routine wellness examination for Tracey Carroll.  Hearing/Vision screen Vision Screening - Comments:: Regular eye exams, [facility]  Dietary issues and exercise activities discussed: Current Exercise Habits: The patient does not participate in regular exercise at present   Goals Addressed  This Visit's Progress    Patient Stated       10/27/2020, no goals        Depression Screen PHQ 2/9 Scores 10/27/2020 10/22/2019 04/17/2019 09/12/2018 03/12/2018 01/17/2017 01/19/2016  PHQ - 2 Score 0 0 0 0 0 0 0    Fall Risk Fall Risk  10/27/2020 10/22/2019 04/17/2019 03/05/2019 09/12/2018  Falls in the past year? 0 1 0 0 0  Number falls in past yr: - 0 0 0 -  Injury with Fall? - 0 - - -  Risk for fall due to : Medication side effect - - - -  Follow up Falls evaluation completed;Education provided;Falls prevention discussed Education provided;Falls evaluation completed Falls evaluation completed Falls evaluation completed Falls evaluation completed    FALL RISK PREVENTION PERTAINING TO THE HOME:  Any stairs in or around the home? No  If so, are there any without handrails? N/a Home free of loose throw rugs in walkways, pet beds, electrical cords, etc? Yes  Adequate lighting in your home to reduce risk of falls? Yes   ASSISTIVE  DEVICES UTILIZED TO PREVENT FALLS:  Life alert? No  Use of a cane, walker or w/c? No  Grab bars in the bathroom? No  Shower chair or bench in shower? No  Elevated toilet seat or a handicapped toilet? Yes   TIMED UP AND GO:  Was the test performed? No .      Cognitive Function:     6CIT Screen 10/27/2020 10/22/2019 03/06/2018 01/17/2017  What Year? 0 points 0 points 0 points 0 points  What month? 0 points 0 points 0 points 0 points  What time? 0 points 0 points 0 points 0 points  Count back from 20 0 points 0 points 0 points 0 points  Months in reverse 0 points 4 points 0 points 0 points  Repeat phrase 8 points 2 points 0 points 2 points  Total Score 8 6 0 2    Immunizations Immunization History  Administered Date(s) Administered   Influenza, High Dose Seasonal PF 01/22/2015, 03/21/2016, 01/17/2017, 03/06/2018, 02/12/2019   Influenza-Unspecified 04/14/2014, 04/14/2014   Moderna Sars-Covid-2 Vaccination 12/30/2019, 01/28/2020   Pneumococcal Conjugate-13 12/18/2013   Pneumococcal Polysaccharide-23 04/16/2012, 12/19/2014   Tdap 08/08/2013, 08/08/2013    TDAP status: Up to date  Flu Vaccine status: Up to date  Pneumococcal vaccine status: Up to date  Covid-19 vaccine status: Completed vaccines  Qualifies for Shingles Vaccine? Yes   Zostavax completed No   Shingrix Completed?: No.    Education has been provided regarding the importance of this vaccine. Patient has been advised to call insurance company to determine out of pocket expense if they have not yet received this vaccine. Advised may also receive vaccine at local pharmacy or Health Dept. Verbalized acceptance and understanding.  Screening Tests Health Maintenance  Topic Date Due   Zoster Vaccines- Shingrix (1 of 2) Never done   COVID-19 Vaccine (3 - Booster for Moderna series) 06/29/2020   INFLUENZA VACCINE  12/14/2020   TETANUS/TDAP  08/09/2023   DEXA SCAN  Completed   Hepatitis C Screening  Completed   PNA  vac Low Risk Adult  Completed   HPV VACCINES  Aged Out    Health Maintenance  Health Maintenance Due  Topic Date Due   Zoster Vaccines- Shingrix (1 of 2) Never done   COVID-19 Vaccine (3 - Booster for Moderna series) 06/29/2020    Colorectal cancer screening: No longer required.   Mammogram status: No longer required due  to age.  Bone Density status: Completed 01/22/2015.   Lung Cancer Screening: (Low Dose CT Chest recommended if Age 24-80 years, 30 pack-year currently smoking OR have quit w/in 15years.) does not qualify.   Lung Cancer Screening Referral: no  Additional Screening:  Hepatitis C Screening: does qualify; Completed 07/07/2020  Vision Screening: Recommended annual ophthalmology exams for early detection of glaucoma and other disorders of the eye. Is the patient up to date with their annual eye exam?  Yes  Who is the provider or what is the name of the office in which the patient attends annual eye exams? [facility] If pt is not established with a provider, would they like to be referred to a provider to establish care? No .   Dental Screening: Recommended annual dental exams for proper oral hygiene  Community Resource Referral / Chronic Care Management: CRR required this visit?  No   CCM required this visit?  No      Plan:     I have personally reviewed and noted the following in the patient's chart:   Medical and social history Use of alcohol, tobacco or illicit drugs  Current medications and supplements including opioid prescriptions.  Functional ability and status Nutritional status Physical activity Advanced directives List of other physicians Hospitalizations, surgeries, and ER visits in previous 12 months Vitals Screenings to include cognitive, depression, and falls Referrals and appointments  In addition, I have reviewed and discussed with patient certain preventive protocols, quality metrics, and best practice recommendations. A  written personalized care plan for preventive services as well as general preventive health recommendations were provided to patient.     Kellie Simmering, LPN   7/41/2878   Nurse Notes:

## 2020-10-27 NOTE — Patient Instructions (Signed)
Tracey Carroll , Thank you for taking time to come for your Medicare Wellness Visit. I appreciate your ongoing commitment to your health goals. Please review the following plan we discussed and let me know if I can assist you in the future.   Screening recommendations/referrals: Colonoscopy: not required Mammogram: not required Bone Density: completed 01/22/2015 Recommended yearly ophthalmology/optometry visit for glaucoma screening and checkup Recommended yearly dental visit for hygiene and checkup  Vaccinations: Influenza vaccine: due 12/14/2020 Pneumococcal vaccine: completed 12/19/2014 Tdap vaccine: completed 08/08/2013, due 08/09/2023 Shingles vaccine: discussed   Covid-19: 01/28/2020, 12/30/2019  Advanced directives: Advance directive discussed with you today.    Conditions/risks identified: none  Next appointment: Follow up in one year for your annual wellness visit    Preventive Care 65 Years and Older, Female Preventive care refers to lifestyle choices and visits with your health care provider that can promote health and wellness. What does preventive care include? A yearly physical exam. This is also called an annual well check. Dental exams once or twice a year. Routine eye exams. Ask your health care provider how often you should have your eyes checked. Personal lifestyle choices, including: Daily care of your teeth and gums. Regular physical activity. Eating a healthy diet. Avoiding tobacco and drug use. Limiting alcohol use. Practicing safe sex. Taking low-dose aspirin every day. Taking vitamin and mineral supplements as recommended by your health care provider. What happens during an annual well check? The services and screenings done by your health care provider during your annual well check will depend on your age, overall health, lifestyle risk factors, and family history of disease. Counseling  Your health care provider may ask you questions about your: Alcohol  use. Tobacco use. Drug use. Emotional well-being. Home and relationship well-being. Sexual activity. Eating habits. History of falls. Memory and ability to understand (cognition). Work and work Statistician. Reproductive health. Screening  You may have the following tests or measurements: Height, weight, and BMI. Blood pressure. Lipid and cholesterol levels. These may be checked every 5 years, or more frequently if you are over 7 years old. Skin check. Lung cancer screening. You may have this screening every year starting at age 61 if you have a 30-pack-year history of smoking and currently smoke or have quit within the past 15 years. Fecal occult blood test (FOBT) of the stool. You may have this test every year starting at age 43. Flexible sigmoidoscopy or colonoscopy. You may have a sigmoidoscopy every 5 years or a colonoscopy every 10 years starting at age 25. Hepatitis C blood test. Hepatitis B blood test. Sexually transmitted disease (STD) testing. Diabetes screening. This is done by checking your blood sugar (glucose) after you have not eaten for a while (fasting). You may have this done every 1-3 years. Bone density scan. This is done to screen for osteoporosis. You may have this done starting at age 58. Mammogram. This may be done every 1-2 years. Talk to your health care provider about how often you should have regular mammograms. Talk with your health care provider about your test results, treatment options, and if necessary, the need for more tests. Vaccines  Your health care provider may recommend certain vaccines, such as: Influenza vaccine. This is recommended every year. Tetanus, diphtheria, and acellular pertussis (Tdap, Td) vaccine. You may need a Td booster every 10 years. Zoster vaccine. You may need this after age 37. Pneumococcal 13-valent conjugate (PCV13) vaccine. One dose is recommended after age 7. Pneumococcal polysaccharide (PPSV23) vaccine. One dose  is  recommended after age 63. Talk to your health care provider about which screenings and vaccines you need and how often you need them. This information is not intended to replace advice given to you by your health care provider. Make sure you discuss any questions you have with your health care provider. Document Released: 05/29/2015 Document Revised: 01/20/2016 Document Reviewed: 03/03/2015 Elsevier Interactive Patient Education  2017 [place] Prevention in the Home Falls can cause injuries. They can happen to people of all ages. There are many things you can do to make your home safe and to help prevent falls. What can I do on the outside of my home? Regularly fix the edges of walkways and driveways and fix any cracks. Remove anything that might make you trip as you walk through a door, such as a raised step or threshold. Trim any bushes or trees on the path to your home. Use bright outdoor lighting. Clear any walking paths of anything that might make someone trip, such as rocks or tools. Regularly check to see if handrails are loose or broken. Make sure that both sides of any steps have handrails. Any raised decks and porches should have guardrails on the edges. Have any leaves, snow, or ice cleared regularly. Use sand or salt on walking paths during winter. Clean up any spills in your garage right away. This includes oil or grease spills. What can I do in the bathroom? Use night lights. Install grab bars by the toilet and in the tub and shower. Do not use towel bars as grab bars. Use non-skid mats or decals in the tub or shower. If you need to sit down in the shower, use a plastic, non-slip stool. Keep the floor dry. Clean up any water that spills on the floor as soon as it happens. Remove soap buildup in the tub or shower regularly. Attach bath mats securely with double-sided non-slip rug tape. Do not have throw rugs and other things on the floor that can make you  trip. What can I do in the bedroom? Use night lights. Make sure that you have a light by your bed that is easy to reach. Do not use any sheets or blankets that are too big for your bed. They should not hang down onto the floor. Have a firm chair that has side arms. You can use this for support while you get dressed. Do not have throw rugs and other things on the floor that can make you trip. What can I do in the kitchen? Clean up any spills right away. Avoid walking on wet floors. Keep items that you use a lot in easy-to-reach places. If you need to reach something above you, use a strong step stool that has a grab bar. Keep electrical cords out of the way. Do not use floor polish or wax that makes floors slippery. If you must use wax, use non-skid floor wax. Do not have throw rugs and other things on the floor that can make you trip. What can I do with my stairs? Do not leave any items on the stairs. Make sure that there are handrails on both sides of the stairs and use them. Fix handrails that are broken or loose. Make sure that handrails are as long as the stairways. Check any carpeting to make sure that it is firmly attached to the stairs. Fix any carpet that is loose or worn. Avoid having throw rugs at the top or bottom of the stairs.  If you do have throw rugs, attach them to the floor with carpet tape. Make sure that you have a light switch at the top of the stairs and the bottom of the stairs. If you do not have them, ask someone to add them for you. What else can I do to help prevent falls? Wear shoes that: Do not have high heels. Have rubber bottoms. Are comfortable and fit you well. Are closed at the toe. Do not wear sandals. If you use a stepladder: Make sure that it is fully opened. Do not climb a closed stepladder. Make sure that both sides of the stepladder are locked into place. Ask someone to hold it for you, if possible. Clearly mark and make sure that you can  see: Any grab bars or handrails. First and last steps. Where the edge of each step is. Use tools that help you move around (mobility aids) if they are needed. These include: Canes. Walkers. Scooters. Crutches. Turn on the lights when you go into a dark area. Replace any light bulbs as soon as they burn out. Set up your furniture so you have a clear path. Avoid moving your furniture around. If any of your floors are uneven, fix them. If there are any pets around you, be aware of where they are. Review your medicines with your doctor. Some medicines can make you feel dizzy. This can increase your chance of falling. Ask your doctor what other things that you can do to help prevent falls. This information is not intended to replace advice given to you by your health care provider. Make sure you discuss any questions you have with your health care provider. Document Released: 02/26/2009 Document Revised: 10/08/2015 Document Reviewed: 06/06/2014 Elsevier Interactive Patient Education  2017 [employer].

## 2020-11-20 DIAGNOSIS — H35033 Hypertensive retinopathy, bilateral: Secondary | ICD-10-CM | POA: Diagnosis not present

## 2020-12-01 ENCOUNTER — Ambulatory Visit: Payer: Medicare Other | Admitting: Unknown Physician Specialty

## 2020-12-01 ENCOUNTER — Encounter: Payer: Self-pay | Admitting: Internal Medicine

## 2020-12-01 ENCOUNTER — Ambulatory Visit (INDEPENDENT_AMBULATORY_CARE_PROVIDER_SITE_OTHER): Payer: Medicare Other | Admitting: Internal Medicine

## 2020-12-01 ENCOUNTER — Other Ambulatory Visit: Payer: Self-pay

## 2020-12-01 VITALS — BP 123/75 | HR 90 | Temp 97.8°F | Resp 17 | Ht 59.0 in | Wt 187.8 lb

## 2020-12-01 DIAGNOSIS — E7800 Pure hypercholesterolemia, unspecified: Secondary | ICD-10-CM | POA: Diagnosis not present

## 2020-12-01 DIAGNOSIS — Z6837 Body mass index (BMI) 37.0-37.9, adult: Secondary | ICD-10-CM

## 2020-12-01 DIAGNOSIS — M8949 Other hypertrophic osteoarthropathy, multiple sites: Secondary | ICD-10-CM

## 2020-12-01 DIAGNOSIS — E6609 Other obesity due to excess calories: Secondary | ICD-10-CM | POA: Insufficient documentation

## 2020-12-01 DIAGNOSIS — I10 Essential (primary) hypertension: Secondary | ICD-10-CM

## 2020-12-01 DIAGNOSIS — K219 Gastro-esophageal reflux disease without esophagitis: Secondary | ICD-10-CM | POA: Diagnosis not present

## 2020-12-01 DIAGNOSIS — M1990 Unspecified osteoarthritis, unspecified site: Secondary | ICD-10-CM | POA: Insufficient documentation

## 2020-12-01 DIAGNOSIS — G4700 Insomnia, unspecified: Secondary | ICD-10-CM

## 2020-12-01 DIAGNOSIS — M159 Polyosteoarthritis, unspecified: Secondary | ICD-10-CM

## 2020-12-01 DIAGNOSIS — I700 Atherosclerosis of aorta: Secondary | ICD-10-CM

## 2020-12-01 DIAGNOSIS — E66812 Obesity, class 2: Secondary | ICD-10-CM | POA: Insufficient documentation

## 2020-12-01 MED ORDER — ATORVASTATIN CALCIUM 20 MG PO TABS: ORAL_TABLET | ORAL | 1 refills | Status: DC

## 2020-12-01 MED ORDER — LOSARTAN POTASSIUM 50 MG PO TABS: 50.0000 mg | ORAL_TABLET | Freq: Every day | ORAL | 1 refills | Status: DC

## 2020-12-01 MED ORDER — HYDROCHLOROTHIAZIDE 12.5 MG PO TABS: 12.5000 mg | ORAL_TABLET | Freq: Every day | ORAL | 1 refills | Status: DC

## 2020-12-01 MED ORDER — OMEPRAZOLE 40 MG PO CPDR: 40.0000 mg | DELAYED_RELEASE_CAPSULE | Freq: Every day | ORAL | 1 refills | Status: DC

## 2020-12-01 NOTE — Assessment & Plan Note (Signed)
Lipid profile reviewed Encouraged her to consume a low fat diet Continue Atorvastatin, refilled today

## 2020-12-01 NOTE — Assessment & Plan Note (Signed)
Will decrease Omeprazole to 40 mg daily Encouraged her to avoid foods that trigger her reflux Encouraged weight loss as this can help reduce reflux symptoms

## 2020-12-01 NOTE — Assessment & Plan Note (Signed)
Encouraged diet and exercise for weight loss ?

## 2020-12-01 NOTE — Assessment & Plan Note (Signed)
Can take Tylenol Arthritis OTC as needed Encouraged weight loss as this can help reduce  joint pain

## 2020-12-01 NOTE — Patient Instructions (Signed)
https://www.nhlbi.nih.gov/files/docs/public/heart/dash_brief.pdf">  DASH Eating Plan DASH stands for Dietary Approaches to Stop Hypertension. The DASH eating plan is a healthy eating plan that has been shown to: Reduce high blood pressure (hypertension). Reduce your risk for type 2 diabetes, heart disease, and stroke. Help with weight loss. What are tips for following this plan? Reading food labels Check food labels for the amount of salt (sodium) per serving. Choose foods with less than 5 percent of the Daily Value of sodium. Generally, foods with less than 300 milligrams (mg) of sodium per serving fit into this eating plan. To find whole grains, look for the word "whole" as the first word in the ingredient list. Shopping Buy products labeled as "low-sodium" or "no salt added." Buy fresh foods. Avoid canned foods and pre-made or frozen meals. Cooking Avoid adding salt when cooking. Use salt-free seasonings or herbs instead of table salt or sea salt. Check with your health care provider or pharmacist before using salt substitutes. Do not fry foods. Cook foods using healthy methods such as baking, boiling, grilling, roasting, and broiling instead. Cook with heart-healthy oils, such as olive, canola, avocado, soybean, or sunflower oil. Meal planning  Eat a balanced diet that includes: 4 or more servings of fruits and 4 or more servings of vegetables each day. Try to fill one-half of your plate with fruits and vegetables. 6-8 servings of whole grains each day. Less than 6 oz (170 g) of lean meat, poultry, or fish each day. A 3-oz (85-g) serving of meat is about the same size as a deck of cards. One egg equals 1 oz (28 g). 2-3 servings of low-fat dairy each day. One serving is 1 cup (237 mL). 1 serving of nuts, seeds, or beans 5 times each week. 2-3 servings of heart-healthy fats. Healthy fats called omega-3 fatty acids are found in foods such as walnuts, flaxseeds, fortified milks, and eggs.  These fats are also found in cold-water fish, such as sardines, salmon, and mackerel. Limit how much you eat of: Canned or prepackaged foods. Food that is high in trans fat, such as some fried foods. Food that is high in saturated fat, such as fatty meat. Desserts and other sweets, sugary drinks, and other foods with added sugar. Full-fat dairy products. Do not salt foods before eating. Do not eat more than 4 egg yolks a week. Try to eat at least 2 vegetarian meals a week. Eat more home-cooked food and less restaurant, buffet, and fast food.  Lifestyle When eating at a restaurant, ask that your food be prepared with less salt or no salt, if possible. If you drink alcohol: Limit how much you use to: 0-1 drink a day for women who are not pregnant. 0-2 drinks a day for men. Be aware of how much alcohol is in your drink. In the [place] one drink equals one 12 oz bottle of beer (355 mL), one 5 oz glass of wine (148 mL), or one 1 oz glass of hard liquor (44 mL). General information Avoid eating more than 2,300 mg of salt a day. If you have hypertension, you may need to reduce your sodium intake to 1,500 mg a day. Work with your health care provider to maintain a healthy body weight or to lose weight. Ask what an ideal weight is for you. Get at least 30 minutes of exercise that causes your heart to beat faster (aerobic exercise) most days of the week. Activities may include walking, swimming, or biking. Work with your health care provider   or dietitian to adjust your eating plan to your individual calorie needs. What foods should I eat? Fruits All fresh, dried, or frozen fruit. Canned fruit in natural juice (without addedsugar). Vegetables Fresh or frozen vegetables (raw, steamed, roasted, or grilled). Low-sodium or reduced-sodium tomato and vegetable juice. Low-sodium or reduced-sodium tomatosauce and tomato paste. Low-sodium or reduced-sodium canned vegetables. Grains Whole-grain or  whole-wheat bread. Whole-grain or whole-wheat pasta. Brown rice. Oatmeal. Quinoa. Bulgur. Whole-grain and low-sodium cereals. Pita bread.Low-fat, low-sodium crackers. Whole-wheat flour tortillas. Meats and other proteins Skinless chicken or turkey. Ground chicken or turkey. Pork with fat trimmed off. Fish and seafood. Egg whites. Dried beans, peas, or lentils. Unsalted nuts, nut butters, and seeds. Unsalted canned beans. Lean cuts of beef with fat trimmed off. Low-sodium, lean precooked or cured meat, such as sausages or meatloaves. Dairy Low-fat (1%) or fat-free (skim) milk. Reduced-fat, low-fat, or fat-free cheeses. Nonfat, low-sodium ricotta or cottage cheese. Low-fat or nonfatyogurt. Low-fat, low-sodium cheese. Fats and oils Soft margarine without trans fats. Vegetable oil. Reduced-fat, low-fat, or light mayonnaise and salad dressings (reduced-sodium). Canola, safflower, olive, avocado, soybean, andsunflower oils. Avocado. Seasonings and condiments Herbs. Spices. Seasoning mixes without salt. Other foods Unsalted popcorn and pretzels. Fat-free sweets. The items listed above may not be a complete list of foods and beverages you can eat. Contact a dietitian for more information. What foods should I avoid? Fruits Canned fruit in a light or heavy syrup. Fried fruit. Fruit in cream or buttersauce. Vegetables Creamed or fried vegetables. Vegetables in a cheese sauce. Regular canned vegetables (not low-sodium or reduced-sodium). Regular canned tomato sauce and paste (not low-sodium or reduced-sodium). Regular tomato and vegetable juice(not low-sodium or reduced-sodium). Pickles. Olives. Grains Baked goods made with fat, such as croissants, muffins, or some breads. Drypasta or rice meal packs. Meats and other proteins Fatty cuts of meat. Ribs. Fried meat. Bacon. Bologna, salami, and other precooked or cured meats, such as sausages or meat loaves. Fat from the back of a pig (fatback). Bratwurst.  Salted nuts and seeds. Canned beans with added salt. Canned orsmoked fish. Whole eggs or egg yolks. Chicken or turkey with skin. Dairy Whole or 2% milk, cream, and half-and-half. Whole or full-fat cream cheese. Whole-fat or sweetened yogurt. Full-fat cheese. Nondairy creamers. Whippedtoppings. Processed cheese and cheese spreads. Fats and oils Butter. Stick margarine. Lard. Shortening. Ghee. Bacon fat. Tropical oils, suchas coconut, palm kernel, or palm oil. Seasonings and condiments Onion salt, garlic salt, seasoned salt, table salt, and sea salt. Worcestershire sauce. Tartar sauce. Barbecue sauce. Teriyaki sauce. Soy sauce, including reduced-sodium. Steak sauce. Canned and packaged gravies. Fish sauce. Oyster sauce. Cocktail sauce. Store-bought horseradish. Ketchup. Mustard. Meat flavorings and tenderizers. Bouillon cubes. Hot sauces. Pre-made or packaged marinades. Pre-made or packaged taco seasonings. Relishes. Regular saladdressings. Other foods Salted popcorn and pretzels. The items listed above may not be a complete list of foods and beverages you should avoid. Contact a dietitian for more information. Where to find more information National Heart, Lung, and Blood Institute: www.nhlbi.nih.gov American Heart Association: www.heart.org Academy of Nutrition and Dietetics: www.eatright.org National Kidney Foundation: www.kidney.org Summary The DASH eating plan is a healthy eating plan that has been shown to reduce high blood pressure (hypertension). It may also reduce your risk for type 2 diabetes, heart disease, and stroke. When on the DASH eating plan, aim to eat more fresh fruits and vegetables, whole grains, lean proteins, low-fat dairy, and heart-healthy fats. With the DASH eating plan, you should limit salt (sodium) intake to 2,300   mg a day. If you have hypertension, you may need to reduce your sodium intake to 1,500 mg a day. Work with your health care provider or dietitian to adjust  your eating plan to your individual calorie needs. This information is not intended to replace advice given to you by your health care provider. Make sure you discuss any questions you have with your healthcare provider. Document Revised: 04/05/2019 Document Reviewed: 04/05/2019 Elsevier Patient Education  2022 Elsevier Inc.  

## 2020-12-01 NOTE — Progress Notes (Signed)
Subjective:    Patient ID: Tracey Carroll, female    DOB: 15-Aug-1940, 80 y.o.   MRN: [id number]  HPI  Pt presents to the clinic today for follow up of chronic conditions. She is establishing care with me today, transferring care from Cyndia Skeeters, NP.  HTN: Her BP today is. She is taking HCTZ and Losartan as prescribed. ECG from 02/2017 reviewed.  OA: Mainly in her back and knees. She does not take any medication OTC for this, just deals with the pain. She does not follow with orthopedics.  HLD with Aortic Atherosclerosis: Her last LDL was 109, triglycerides 96, 2/20222. She is taking Atorvastatin every other day secondary to myalgias. She tries to consume a low fat diet.  GERD: She reports this is triggered by certain foods but can not identify which specific foods. She denies breakthrough on Omeprazole. Upper GI from 09/2019 reviewed.  Insomnia: She has difficulty falling and staying asleep. She is not taking any medications for this. There is no sleep study on file.   Review of Systems  Past Medical History:  Diagnosis Date   Acid reflux 10/19/2014   Arthritis    arms   Back pain, chronic 10/19/2014   Dysphagia 10/20/2014   Elevated BP 10/20/2014   Fibroids, intramural 10/19/2014   FOM (frequency of micturition) 10/19/2014   GERD (gastroesophageal reflux disease)    Hyperlipidemia    Hypertension    Insomnia, persistent 10/19/2014   LBP (low back pain) 10/19/2014    Current Outpatient Medications  Medication Sig Dispense Refill   atorvastatin (LIPITOR) 20 MG tablet TAKE 1 TABLET BY MOUTH EVERY OTHER DAY 90 tablet 1   conjugated estrogens (PREMARIN) vaginal cream Apply 0.5mg  (pea-sized amount)  just inside the vaginal introitus with a finger-tip on  Monday, Wednesday and Friday nights. 30 g 12   hydrochlorothiazide (HYDRODIURIL) 12.5 MG tablet Take 1 tablet (12.5 mg total) by mouth daily. 90 tablet 0   losartan (COZAAR) 50 MG tablet Take 1 tablet (50 mg total) by mouth daily. 90 tablet  0   omeprazole (PRILOSEC) 40 MG capsule Take 1 capsule (40 mg total) by mouth 2 (two) times daily. 180 capsule 1   No current facility-administered medications for this visit.    No Known Allergies  Family History  Problem Relation Age of Onset   Diabetes Father        in her 71 and 92s   Hypertension Daughter        her 94s   Kidney cancer Neg Hx    Bladder Cancer Neg Hx     Social History   Socioeconomic History   Marital status: Single    Spouse name: Not on file   Number of children: Not on file   Years of education: Not on file   Highest education level: 12th grade  Occupational History   Occupation: retired  Tobacco Use   Smoking status: Never   Smokeless tobacco: Never  Vaping Use   Vaping Use: Never used  Substance and Sexual Activity   Alcohol use: No    Alcohol/week: 0.0 standard drinks   Drug use: No   Sexual activity: Not on file  Other Topics Concern   Not on file  Social History Narrative   ** Merged History Encounter **       Social Determinants of Health   Financial Resource Strain: Low Risk    Difficulty of Paying Living Expenses: Not hard at all  Food Insecurity: No Food  Insecurity   Worried About Charity fundraiser in the Last Year: Never true   [place] in the Last Year: Never true  Transportation Needs: No Transportation Needs   Lack of Transportation (Medical): No   Lack of Transportation (Non-Medical): No  Physical Activity: Inactive   Days of Exercise per Week: 0 days   Minutes of Exercise per Session: 0 min  Stress: No Stress Concern Present   Feeling of Stress : Not at all  Social Connections: Not on file  Intimate Partner Violence: Not on file     Constitutional: Denies fever, malaise, fatigue, headache or abrupt weight changes.  HEENT: Denies eye pain, eye redness, ear pain, ringing in the ears, wax buildup, runny nose, nasal congestion, bloody nose, or sore throat. Respiratory: Denies difficulty breathing,  shortness of breath, cough or sputum production.   Cardiovascular: Pt reports intermittent swelling in her legs. Denies chest pain, chest tightness, palpitations or swelling in the hands or feet.  Gastrointestinal: Denies abdominal pain, bloating, constipation, diarrhea or blood in the stool.  GU: Denies urgency, frequency, pain with urination, burning sensation, blood in urine, odor or discharge. Musculoskeletal: Pt reports joint pain. Denies decrease in range of motion, difficulty with gait, muscle pain or joint swelling.  Skin: Denies redness, rashes, lesions or ulcercations.  Neurological: Pt reports insomnia. Denies dizziness, difficulty with memory, difficulty with speech or problems with balance and coordination.  Psych: Denies anxiety, depression, SI/HI.  No other specific complaints in a complete review of systems (except as listed in HPI above).     Objective:   Physical Exam   BP 123/75 (BP Location: Left Arm, Patient Position: Sitting, Cuff Size: Large)   Pulse 90   Temp 97.8 F (36.6 C) (Temporal)   Resp 17   Ht 4\' 11"  (1.499 m)   Wt 187 lb 12.8 oz (85.2 kg)   SpO2 100%   BMI 37.93 kg/m  Wt Readings from Last 3 Encounters:  12/01/20 187 lb 12.8 oz (85.2 kg)  10/27/20 184 lb (83.5 kg)  07/07/20 184 lb 12.8 oz (83.8 kg)    General: Appears her stated age, obese,  in NAD. Skin: Warm, dry and intact. No ulcerations noted. HEENT: Head: normal shape and size; Eyes: sclera white and EOMs intact;  Cardiovascular: Normal rate and rhythm. S1,S2 noted.  No murmur, rubs or gallops noted. No JVD or BLE edema. No carotid bruits noted. Pulmonary/Chest: Normal effort and positive vesicular breath sounds. No respiratory distress. No wheezes, rales or ronchi noted.  Abdomen: Soft and nontender. Normal bowel sounds. No distention or masses noted.  Musculoskeletal: Difficulty getting from a sitting to a standing position. No difficulty with gait.  Neurological: Alert and oriented.     BMET    Component Value Date/Time   NA 139 07/07/2020 0829   K 3.9 07/07/2020 0829   CL 102 07/07/2020 0829   CO2 30 07/07/2020 0829   GLUCOSE 99 07/07/2020 0829   BUN 11 07/07/2020 0829   BUN 14 01/04/2017 1622   CREATININE 0.82 07/07/2020 0829   CALCIUM 9.6 07/07/2020 0829   GFRNONAA 81 08/30/2019 0911   GFRAA 94 08/30/2019 0911    Lipid Panel     Component Value Date/Time   CHOL 194 07/07/2020 0829   TRIG 96 07/07/2020 0829   HDL 66 07/07/2020 0829   CHOLHDL 2.9 07/07/2020 0829   LDLCALC 109 (H) 07/07/2020 0829    CBC    Component Value Date/Time   WBC  4.2 08/30/2019 0911   RBC 3.92 08/30/2019 0911   HGB 12.5 08/30/2019 0911   HCT 37.2 08/30/2019 0911   PLT 235 08/30/2019 0911   MCV 94.9 08/30/2019 0911   MCH 31.9 08/30/2019 0911   MCHC 33.6 08/30/2019 0911   RDW 12.2 08/30/2019 0911   LYMPHSABS 1,604 08/30/2019 0911   EOSABS 42 08/30/2019 0911   BASOSABS 21 08/30/2019 0911    Hgb A1C Lab Results  Component Value Date   HGBA1C 5.2 09/11/2017           Assessment & Plan:    Webb Silversmith, NP This visit occurred during the SARS-CoV-2 public health emergency.  Safety protocols were in place, including screening questions prior to the visit, additional usage of staff PPE, and extensive cleaning of exam room while observing appropriate contact time as indicated for disinfecting solutions.

## 2020-12-01 NOTE — Assessment & Plan Note (Signed)
Lipid profile reviewed Encouraged him to consume a low fat diet Continue Atorvastatin, refilled today

## 2020-12-01 NOTE — Assessment & Plan Note (Signed)
Controlled on HCTZ and Losartan Kidney function reviewed Encouraged DASH diet and exercise for weight loss

## 2020-12-01 NOTE — Assessment & Plan Note (Signed)
She is not interested in medication therapy at this time Will monitor

## 2020-12-28 ENCOUNTER — Telehealth: Payer: Self-pay

## 2020-12-28 DIAGNOSIS — K219 Gastro-esophageal reflux disease without esophagitis: Secondary | ICD-10-CM

## 2020-12-28 DIAGNOSIS — I10 Essential (primary) hypertension: Secondary | ICD-10-CM

## 2020-12-28 DIAGNOSIS — E7800 Pure hypercholesterolemia, unspecified: Secondary | ICD-10-CM

## 2020-12-28 MED ORDER — OMEPRAZOLE 40 MG PO CPDR: 40.0000 mg | DELAYED_RELEASE_CAPSULE | Freq: Every day | ORAL | 2 refills | Status: DC

## 2020-12-28 MED ORDER — HYDROCHLOROTHIAZIDE 12.5 MG PO TABS: 12.5000 mg | ORAL_TABLET | Freq: Every day | ORAL | 2 refills | Status: DC

## 2020-12-28 MED ORDER — ATORVASTATIN CALCIUM 20 MG PO TABS: ORAL_TABLET | ORAL | 3 refills | Status: DC

## 2020-12-28 MED ORDER — LOSARTAN POTASSIUM 50 MG PO TABS: 50.0000 mg | ORAL_TABLET | Freq: Every day | ORAL | 2 refills | Status: DC

## 2020-12-28 NOTE — Telephone Encounter (Signed)
Copied from [place] [phone]. Topic: General - Other >> Dec 28, 2020 10:16 AM Alanda Slim E wrote: Reason for CRM: Pt wants to have her medications set up to be sent to [facility] for now on in 90 supplies/ please advise

## 2021-04-28 NOTE — Progress Notes (Incomplete)
04/29/2021 1:47 PM   Tracey Carroll 08/11/1940 [id number]  Referring provider: Verl Bangs, FNP No address on file  No chief complaint on file.  Urological history: 1.  High risk hematuria -Non-smoker -hematuria work up in 2015 and 2018 with CTU and cystoscopy - NED -no reports of gross heme -UA ***  2. Dysuria -Secondary to vaginal atrophy -Managed with vaginal estrogen cream 3 nights weekly  HPI: Tracey Carroll is a 80 y.o. female who presents today for a yearly visit.     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/19/2014    Surgical History: Past Surgical History:  Procedure Laterality Date   ESOPHAGOGASTRODUODENOSCOPY (EGD) WITH PROPOFOL N/A 11/20/2014   Procedure: ESOPHAGOGASTRODUODENOSCOPY (EGD) WITH PROPOFOL with dilation;  Surgeon: Lucilla Lame, MD;  Location: [facility];  Service: Endoscopy;  Laterality: N/A;   ESOPHAGOGASTRODUODENOSCOPY (EGD) WITH PROPOFOL N/A 09/17/2019   Procedure: ESOPHAGOGASTRODUODENOSCOPY (EGD) WITH PROPOFOL;  Surgeon: Virgel Manifold, MD;  Location: [facility];  Service: Endoscopy;  Laterality: N/A;   TUBAL LIGATION      Home Medications:  Allergies as of 04/29/2021   No Known Allergies      Medication List        Accurate as of April 28, 2021  1:47 PM. If you have any questions, ask your nurse or doctor.          atorvastatin 20 MG tablet Commonly known as: LIPITOR TAKE 1 TABLET BY MOUTH EVERY OTHER DAY   hydrochlorothiazide 12.5 MG tablet Commonly known as: HYDRODIURIL Take 1 tablet (12.5 mg total) by mouth daily.   losartan 50 MG tablet Commonly known as: COZAAR Take 1 tablet (50 mg total) by mouth daily.   omeprazole 40 MG capsule Commonly known as: PRILOSEC Take 1 capsule (40 mg total) by mouth  daily.   Premarin vaginal cream Generic drug: conjugated estrogens Apply 0.5mg  (pea-sized amount)  just inside the vaginal introitus with a finger-tip on  Monday, Wednesday and Friday nights.        Allergies: No Known Allergies  Family History: Family History  Problem Relation Age of Onset   Diabetes Father        in her 24 and 39s   Hypertension Daughter        her 83s   Kidney cancer Neg Hx    Bladder Cancer Neg Hx     Social History:  reports that she has never smoked. She has never used smokeless tobacco. She reports that she does not drink alcohol and does not use drugs.  ROS: For pertinent review of systems please refer to history of present illness  Physical Exam: There were no vitals taken for this visit.  Constitutional:  Well nourished. Alert and oriented, No acute distress. HEENT: [place] AT, mask in place.  Trachea midline Cardiovascular: No clubbing, cyanosis, or edema. Respiratory: Normal respiratory effort, no increased work of breathing. GU: No CVA tenderness.  No bladder fullness or masses.  Atrophic external genitalia, normal pubic hair distribution, no lesions.  Normal urethral meatus, no lesions, no prolapse, no discharge.   No urethral masses, tenderness and/or tenderness. No bladder fullness, tenderness or masses. Pale vagina mucosa, good estrogen effect, no discharge, no lesions, fair pelvic support, grade I cystocele and grade I  rectocele noted.  Anus and perineum are without rashes or lesions.   Neurologic: Grossly intact, no focal deficits, moving all 4 extremities. Psychiatric: Normal mood and affect.   Laboratory Data: Lab Results  Component Value Date   CREATININE 0.82 07/07/2020    Lab Results  Component Value Date   AST 18 07/07/2020   Lab Results  Component Value Date   ALT 8 07/07/2020    Urinalysis *** I have reviewed the labs.  Pertinent imaging N/A   Assessment & Plan:    1. Vaginal atrophy Continue vaginal estrogen cream  Monday, Wednesday and Friday nights Refill for Premarin cream sent to [facility] on [address] Return to the clinic in 1 year for exam  2.  History of high risk hematuria Work-up was completed in 2015 and 2018-NED No reports of gross hematuria UA today negative for micro heme Return in 1 year for UA-patient report any gross hematuria in the interim  3. Nocturia UA benign Not bothersome to patient at this time  No follow-ups on file.  These notes generated with voice recognition software. I apologize for typographical errors.  Zara Council, PA-C  [facility] [address] [place], [place] [address] [phone]

## 2021-04-29 ENCOUNTER — Ambulatory Visit: Payer: Self-pay | Admitting: Urology

## 2021-04-29 DIAGNOSIS — R319 Hematuria, unspecified: Secondary | ICD-10-CM

## 2021-04-29 DIAGNOSIS — N952 Postmenopausal atrophic vaginitis: Secondary | ICD-10-CM

## 2021-05-18 NOTE — Progress Notes (Signed)
05/19/2021 1:56 PM   Tracey Carroll 1940-07-22 [id number]  Referring provider: Verl Bangs, FNP No address on file  Chief Complaint  Patient presents with   Hematuria   Vaginal Atrophy   Urological history: 1.  High risk hematuria -Non-smoker -hematuria work up in 2015 and 2018 with CTU and cystoscopy - NED -no reports of gross heme -UA negative for micro heme  2. Vaginal atrophy -managed with vaginal estrogen cream three nights weekly   HPI: Tracey Carroll is a 81 y.o. female who presents today for a yearly visit.    She continues to have nocturia x 1 on occasion.  Patient denies any modifying or aggravating factors.  Patient denies any gross hematuria, dysuria or suprapubic/flank pain.  Patient denies any fevers, chills, nausea or vomiting.    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/19/2014    Surgical History: Past Surgical History:  Procedure Laterality Date   ESOPHAGOGASTRODUODENOSCOPY (EGD) WITH PROPOFOL N/A 11/20/2014   Procedure: ESOPHAGOGASTRODUODENOSCOPY (EGD) WITH PROPOFOL with dilation;  Surgeon: Lucilla Lame, MD;  Location: [facility];  Service: Endoscopy;  Laterality: N/A;   ESOPHAGOGASTRODUODENOSCOPY (EGD) WITH PROPOFOL N/A 09/17/2019   Procedure: ESOPHAGOGASTRODUODENOSCOPY (EGD) WITH PROPOFOL;  Surgeon: Virgel Manifold, MD;  Location: [facility];  Service: Endoscopy;  Laterality: N/A;   TUBAL LIGATION      Home Medications:  Allergies as of 05/19/2021   No Known Allergies      Medication List        Accurate as of May 19, 2021  1:56 PM. If you have any questions, ask your nurse or doctor.          atorvastatin 20 MG tablet Commonly known as: LIPITOR TAKE 1 TABLET BY MOUTH EVERY OTHER DAY   hydrochlorothiazide 12.5 MG  tablet Commonly known as: HYDRODIURIL Take 1 tablet (12.5 mg total) by mouth daily.   losartan 50 MG tablet Commonly known as: COZAAR Take 1 tablet (50 mg total) by mouth daily.   omeprazole 40 MG capsule Commonly known as: PRILOSEC Take 1 capsule (40 mg total) by mouth daily.   Premarin vaginal cream Generic drug: conjugated estrogens Apply 0.5mg  (pea-sized amount)  just inside the vaginal introitus with a finger-tip on  Monday, Wednesday and Friday nights.        Allergies: No Known Allergies  Family History: Family History  Problem Relation Age of Onset   Diabetes Father        in her 35 and 48s   Hypertension Daughter        her 24s   Kidney cancer Neg Hx    Bladder Cancer Neg Hx     Social History:  reports that she has never smoked. She has never used smokeless tobacco. She reports that she does not drink alcohol and does not use drugs.  ROS: For pertinent review of systems please refer to history of present illness  Physical Exam: BP 139/80    Pulse 74    Ht 4\' 11"  (1.499 m)    Wt 190 lb (86.2 kg)    BMI 38.38 kg/m   Constitutional:  Well nourished. Alert and oriented, No acute distress. HEENT: [place] AT, mask in place.  Trachea midline Cardiovascular: No clubbing, cyanosis, or edema. Respiratory: Normal  respiratory effort, no increased work of breathing. Neurologic: Grossly intact, no focal deficits, moving all 4 extremities. Psychiatric: Normal mood and affect.    Laboratory Data: Lab Results  Component Value Date   CREATININE 0.82 07/07/2020    Lab Results  Component Value Date   AST 18 07/07/2020   Lab Results  Component Value Date   ALT 8 07/07/2020    Urinalysis Results for orders placed or performed in visit on 05/19/21  Microscopic Examination   Urine  Result Value Ref Range   WBC, UA 6-10 (A) 0 - 5 /hpf   RBC 0-2 0 - 2 /hpf   Epithelial Cells (non renal) 0-10 0 - 10 /hpf   Bacteria, UA Few (A) None seen/Few  Urinalysis, Complete   Result Value Ref Range   Specific Gravity, UA 1.015 1.005 - 1.030   pH, UA 6.5 5.0 - 7.5   Color, UA Yellow Yellow   Appearance Ur Clear Clear   Leukocytes,UA 2+ (A) Negative   Protein,UA Negative Negative/Trace   Glucose, UA Negative Negative   Ketones, UA Negative Negative   RBC, UA Trace (A) Negative   Bilirubin, UA Negative Negative   Urobilinogen, Ur 1.0 0.2 - 1.0 mg/dL   Nitrite, UA Negative Negative   Microscopic Examination See below:     I have reviewed the labs.  Pertinent imaging N/A   Assessment & Plan:    1. Vaginal atrophy -continue vaginal estrogen cream three nights weekly -refill sent to pharmacy  2.  History of high risk hematuria -Work-up was completed in 2015 and 2018-NED -No reports of gross hematuria -UA today negative for micro heme  3. Nocturia -minimal bother  Return in about 1 year (around 05/19/2022) for ua .  These notes generated with voice recognition software. I apologize for typographical errors.  Zara Council, PA-C  [facility] [address] [place], [place] [address] [phone]

## 2021-05-19 ENCOUNTER — Other Ambulatory Visit: Payer: Self-pay

## 2021-05-19 ENCOUNTER — Ambulatory Visit (INDEPENDENT_AMBULATORY_CARE_PROVIDER_SITE_OTHER): Payer: Medicare Other | Admitting: Urology

## 2021-05-19 ENCOUNTER — Encounter: Payer: Self-pay | Admitting: Urology

## 2021-05-19 VITALS — BP 139/80 | HR 74 | Ht 59.0 in | Wt 190.0 lb

## 2021-05-19 DIAGNOSIS — R319 Hematuria, unspecified: Secondary | ICD-10-CM | POA: Diagnosis not present

## 2021-05-19 DIAGNOSIS — R351 Nocturia: Secondary | ICD-10-CM | POA: Diagnosis not present

## 2021-05-19 DIAGNOSIS — N952 Postmenopausal atrophic vaginitis: Secondary | ICD-10-CM

## 2021-05-19 LAB — URINALYSIS, COMPLETE
Bilirubin, UA: NEGATIVE
Glucose, UA: NEGATIVE
Ketones, UA: NEGATIVE
Nitrite, UA: NEGATIVE
Protein,UA: NEGATIVE
Specific Gravity, UA: 1.015 (ref 1.005–1.030)
Urobilinogen, Ur: 1 mg/dL (ref 0.2–1.0)
pH, UA: 6.5 (ref 5.0–7.5)

## 2021-05-19 LAB — MICROSCOPIC EXAMINATION

## 2021-05-19 MED ORDER — PREMARIN 0.625 MG/GM VA CREA: TOPICAL_CREAM | VAGINAL | 12 refills | Status: DC

## 2021-06-07 ENCOUNTER — Other Ambulatory Visit: Payer: Self-pay

## 2021-06-07 ENCOUNTER — Encounter: Payer: Self-pay | Admitting: Internal Medicine

## 2021-06-07 ENCOUNTER — Ambulatory Visit (INDEPENDENT_AMBULATORY_CARE_PROVIDER_SITE_OTHER): Payer: Medicare Other | Admitting: Internal Medicine

## 2021-06-07 VITALS — BP 118/66 | HR 75 | Temp 97.1°F | Resp 17 | Ht 59.0 in | Wt 186.6 lb

## 2021-06-07 DIAGNOSIS — E785 Hyperlipidemia, unspecified: Secondary | ICD-10-CM | POA: Diagnosis not present

## 2021-06-07 DIAGNOSIS — Z0001 Encounter for general adult medical examination with abnormal findings: Secondary | ICD-10-CM

## 2021-06-07 DIAGNOSIS — Z23 Encounter for immunization: Secondary | ICD-10-CM

## 2021-06-07 DIAGNOSIS — K219 Gastro-esophageal reflux disease without esophagitis: Secondary | ICD-10-CM | POA: Diagnosis not present

## 2021-06-07 DIAGNOSIS — Z780 Asymptomatic menopausal state: Secondary | ICD-10-CM | POA: Diagnosis not present

## 2021-06-07 DIAGNOSIS — D709 Neutropenia, unspecified: Secondary | ICD-10-CM | POA: Diagnosis not present

## 2021-06-07 DIAGNOSIS — Z6837 Body mass index (BMI) 37.0-37.9, adult: Secondary | ICD-10-CM

## 2021-06-07 DIAGNOSIS — I700 Atherosclerosis of aorta: Secondary | ICD-10-CM | POA: Diagnosis not present

## 2021-06-07 LAB — COMPLETE METABOLIC PANEL WITH GFR
AG Ratio: 1.3 (calc) (ref 1.0–2.5)
ALT: 12 U/L (ref 6–29)
AST: 20 U/L (ref 10–35)
Albumin: 4.2 g/dL (ref 3.6–5.1)
Alkaline phosphatase (APISO): 55 U/L (ref 37–153)
BUN: 12 mg/dL (ref 7–25)
CO2: 31 mmol/L (ref 20–32)
Calcium: 9.7 mg/dL (ref 8.6–10.4)
Chloride: 101 mmol/L (ref 98–110)
Creat: 0.77 mg/dL (ref 0.60–1.00)
Globulin: 3.3 g/dL (calc) (ref 1.9–3.7)
Glucose, Bld: 97 mg/dL (ref 65–99)
Potassium: 3.9 mmol/L (ref 3.5–5.3)
Sodium: 140 mmol/L (ref 135–146)
Total Bilirubin: 0.6 mg/dL (ref 0.2–1.2)
Total Protein: 7.5 g/dL (ref 6.1–8.1)
eGFR: 79 mL/min/{1.73_m2} (ref >=60)

## 2021-06-07 LAB — CBC
HCT: 36.3 % (ref 35.0–45.0)
Hemoglobin: 12.3 g/dL (ref 11.7–15.5)
MCH: 31.8 pg (ref 27.0–33.0)
MCHC: 33.9 g/dL (ref 32.0–36.0)
MCV: 93.8 fL (ref 80.0–100.0)
MPV: 11.2 fL (ref 7.5–12.5)
Platelets: 213 10*3/uL (ref 140–400)
RBC: 3.87 10*6/uL (ref 3.80–5.10)
RDW: 12.2 % (ref 11.0–15.0)
WBC: 3.3 10*3/uL — ABNORMAL LOW (ref 3.8–10.8)

## 2021-06-07 LAB — LIPID PANEL
Cholesterol: 200 mg/dL — ABNORMAL HIGH (ref <200)
HDL: 69 mg/dL (ref >=50)
LDL Cholesterol (Calc): 111 mg/dL (calc) — ABNORMAL HIGH
Non-HDL Cholesterol (Calc): 131 mg/dL (calc) — ABNORMAL HIGH (ref <130)
Total CHOL/HDL Ratio: 2.9 (calc) (ref <5.0)
Triglycerides: 97 mg/dL (ref <150)

## 2021-06-07 MED ORDER — ASPIRIN 81 MG PO TBEC: 81.0000 mg | DELAYED_RELEASE_TABLET | Freq: Every day | ORAL | 12 refills | Status: DC

## 2021-06-07 NOTE — Progress Notes (Signed)
Subjective:    Patient ID: Tracey Carroll, female    DOB: 1940-09-26, 81 y.o.   MRN: [id number]  HPI  Pt presents to the clinic today for her annual exam.  Flu: 01/2019 Tetanus: 07/2013 COVID: Levan Hurst x2 Pneumovax: 12/2014 Prevnar: 12/2013 Shingrix: Never Pap smear: 05/2011 no longer screening Mammogram: 12/2016 Bone density: 01/2011 Colon screening: Never, no longer screening Vision screening: annually Dentist: as needed  Diet: She does eat lean meat. She eats some fruit, not many veggies. She tries to avoid fried foods. She drinks mostly coffee, water. Exercise: None  Review of Systems     Past Medical History:  Diagnosis Date   Acid reflux 10/19/2014   Arthritis    arms   Back pain, chronic 10/19/2014   GERD (gastroesophageal reflux disease)    Hyperlipidemia    Hypertension    Insomnia, persistent 10/19/2014    Current Outpatient Medications  Medication Sig Dispense Refill   atorvastatin (LIPITOR) 20 MG tablet TAKE 1 TABLET BY MOUTH EVERY OTHER DAY 45 tablet 3   conjugated estrogens (PREMARIN) vaginal cream Apply 0.53m (pea-sized amount)  just inside the vaginal introitus with a finger-tip on  Monday, Wednesday and Friday nights. 30 g 12   hydrochlorothiazide (HYDRODIURIL) 12.5 MG tablet Take 1 tablet (12.5 mg total) by mouth daily. 90 tablet 2   losartan (COZAAR) 50 MG tablet Take 1 tablet (50 mg total) by mouth daily. 90 tablet 2   omeprazole (PRILOSEC) 40 MG capsule Take 1 capsule (40 mg total) by mouth daily. 90 capsule 2   No current facility-administered medications for this visit.    No Known Allergies  Family History  Problem Relation Age of Onset   Diabetes Father        in her 628and 725s  Hypertension Daughter        her 545s  Kidney cancer Neg Hx    Bladder Cancer Neg Hx     Social History   Socioeconomic History   Marital status: Single    Spouse name: Not on file   Number of children: Not on file   Years of education: Not on file    Highest education level: 12th grade  Occupational History   Occupation: retired  Tobacco Use   Smoking status: Never   Smokeless tobacco: Never  Vaping Use   Vaping Use: Never used  Substance and Sexual Activity   Alcohol use: No    Alcohol/week: 0.0 standard drinks   Drug use: No   Sexual activity: Not on file  Other Topics Concern   Not on file  Social History Narrative   ** Merged History Encounter **       Social Determinants of Health   Financial Resource Strain: Low Risk    Difficulty of Paying Living Expenses: Not hard at all  Food Insecurity: No Food Insecurity   Worried About RCharity fundraiserin the Last Year: Never true   RBlanchardin the Last Year: Never true  Transportation Needs: No Transportation Needs   Lack of Transportation (Medical): No   Lack of Transportation (Non-Medical): No  Physical Activity: Inactive   Days of Exercise per Week: 0 days   Minutes of Exercise per Session: 0 min  Stress: No Stress Concern Present   Feeling of Stress : Not at all  Social Connections: Not on file  Intimate Partner Violence: Not on file     Constitutional: Denies fever, malaise, fatigue, headache or  abrupt weight changes.  HEENT: Denies eye pain, eye redness, ear pain, ringing in the ears, wax buildup, runny nose, nasal congestion, bloody nose, or sore throat. Respiratory: Denies difficulty breathing, shortness of breath, cough or sputum production.   Cardiovascular: Denies chest pain, chest tightness, palpitations or swelling in the hands or feet.  Gastrointestinal: Patient reports occasional reflux and intermittent constipation.  Denies abdominal pain, bloating, diarrhea or blood in the stool.  GU: Denies urgency, frequency, pain with urination, burning sensation, blood in urine, odor or discharge. Musculoskeletal: Patient reports joint pain.  Denies decrease in range of motion, difficulty with gait, muscle pain or joint swelling.  Skin: Denies redness,  rashes, lesions or ulcercations.  Neurological: Patient reports insomnia.  Denies dizziness, difficulty with memory, difficulty with speech or problems with balance and coordination.  Psych: Denies anxiety, depression, SI/HI.  No other specific complaints in a complete review of systems (except as listed in HPI above).  Objective:   Physical Exam  BP 118/66 (BP Location: Right Arm, Patient Position: Sitting, Cuff Size: Large)    Pulse 75    Temp (!) 97.1 F (36.2 C) (Temporal)    Resp 17    Ht _0  (1.499 m)    Wt 186 lb 9.6 oz (84.6 kg)    SpO2 100%    BMI 37.69 kg/m   Wt Readings from Last 3 Encounters:  05/19/21 190 lb (86.2 kg)  12/01/20 187 lb 12.8 oz (85.2 kg)  10/27/20 184 lb (83.5 kg)    General: Appears her stated age, obese, in NAD. Skin: Warm, dry and intact.  HEENT: Head: normal shape and size; Eyes: sclera white and EOMs intact;  Neck:  Neck supple, trachea midline. No masses, lumps or thyromegaly present.  Cardiovascular: Normal rate and rhythm. S1,S2 noted.  No murmur, rubs or gallops noted. No JVD or BLE edema. No carotid bruits noted. Pulmonary/Chest: Normal effort and positive vesicular breath sounds. No respiratory distress. No wheezes, rales or ronchi noted.  Abdomen: Soft and nontender. Normal bowel sounds.  Musculoskeletal: Strength 5/5 BUE/BLE.  No difficulty with gait.  Neurological: Alert and oriented. Cranial nerves II-XII grossly intact. Coordination normal.  Psychiatric: Mood and affect normal. Behavior is normal. Judgment and thought content normal.    BMET    Component Value Date/Time   NA 139 07/07/2020 0829   K 3.9 07/07/2020 0829   CL 102 07/07/2020 0829   CO2 30 07/07/2020 0829   GLUCOSE 99 07/07/2020 0829   BUN 11 07/07/2020 0829   BUN 14 01/04/2017 1622   CREATININE 0.82 07/07/2020 0829   CALCIUM 9.6 07/07/2020 0829   GFRNONAA 81 08/30/2019 0911   GFRAA 94 08/30/2019 0911    Lipid Panel     Component Value Date/Time   CHOL 194  07/07/2020 0829   TRIG 96 07/07/2020 0829   HDL 66 07/07/2020 0829   CHOLHDL 2.9 07/07/2020 0829   LDLCALC 109 (H) 07/07/2020 0829    CBC    Component Value Date/Time   WBC 4.2 08/30/2019 0911   RBC 3.92 08/30/2019 0911   HGB 12.5 08/30/2019 0911   HCT 37.2 08/30/2019 0911   PLT 235 08/30/2019 0911   MCV 94.9 08/30/2019 0911   MCH 31.9 08/30/2019 0911   MCHC 33.6 08/30/2019 0911   RDW 12.2 08/30/2019 0911   LYMPHSABS 1,604 08/30/2019 0911   EOSABS 42 08/30/2019 0911   BASOSABS 21 08/30/2019 0911    Hgb A1C Lab Results  Component Value Date  HGBA1C 5.2 09/11/2017           Assessment & Plan:   Preventative Health Maintenance:  Flu shot today Tetanus UTD Encouraged her to get her COVID booster Pneumovax UTD Prevnar UTD Discussed Shingrix vaccine, if she would like to get this she can get this done at pharmacy She no longer wants to screen for cervical, breast or colon cancer Bone density ordered-she will call to schedule Encouraged her to consume a balanced diet and exercise regimen Advised her to see an eye doctor and dentist annually We will check CBC, c-Met, lipid profile today  RTC in 6 months, follow-up chronic conditions Webb Silversmith, NP This visit occurred during the SARS-CoV-2 public health emergency.  Safety protocols were in place, including screening questions prior to the visit, additional usage of staff PPE, and extensive cleaning of exam room while observing appropriate contact time as indicated for disinfecting solutions.

## 2021-06-07 NOTE — Patient Instructions (Signed)
Health Maintenance for Postmenopausal Women ?Menopause is a normal process in which your ability to get pregnant comes to an end. This process happens slowly over many months or years, usually between the ages of 48 and 55. Menopause is complete when you have missed your menstrual period for 12 months. ?It is important to talk with your health care provider about some of the most common conditions that affect women after menopause (postmenopausal women). These include heart disease, cancer, and bone loss (osteoporosis). Adopting a healthy lifestyle and getting preventive care can help to promote your health and wellness. The actions you take can also lower your chances of developing some of these common conditions. ?What are the signs and symptoms of menopause? ?During menopause, you may have the following symptoms: ?Hot flashes. These can be moderate or severe. ?Night sweats. ?Decrease in sex drive. ?Mood swings. ?Headaches. ?Tiredness (fatigue). ?Irritability. ?Memory problems. ?Problems falling asleep or staying asleep. ?Talk with your health care provider about treatment options for your symptoms. ?Do I need hormone replacement therapy? ?Hormone replacement therapy is effective in treating symptoms that are caused by menopause, such as hot flashes and night sweats. ?Hormone replacement carries certain risks, especially as you become older. If you are thinking about using estrogen or estrogen with progestin, discuss the benefits and risks with your health care provider. ?How can I reduce my risk for heart disease and stroke? ?The risk of heart disease, heart attack, and stroke increases as you age. One of the causes may be a change in the body's hormones during menopause. This can affect how your body uses dietary fats, triglycerides, and cholesterol. Heart attack and stroke are medical emergencies. There are many things that you can do to help prevent heart disease and stroke. ?Watch your blood pressure ?High  blood pressure causes heart disease and increases the risk of stroke. This is more likely to develop in people who have high blood pressure readings or are overweight. ?Have your blood pressure checked: ?Every 3-5 years if you are 18-39 years of age. ?Every year if you are 40 years old or older. ?Eat a healthy diet ? ?Eat a diet that includes plenty of vegetables, fruits, low-fat dairy products, and lean protein. ?Do not eat a lot of foods that are high in solid fats, added sugars, or sodium. ?Get regular exercise ?Get regular exercise. This is one of the most important things you can do for your health. Most adults should: ?Try to exercise for at least 150 minutes each week. The exercise should increase your heart rate and make you sweat (moderate-intensity exercise). ?Try to do strengthening exercises at least twice each week. Do these in addition to the moderate-intensity exercise. ?Spend less time sitting. Even light physical activity can be beneficial. ?Other tips ?Work with your health care provider to achieve or maintain a healthy weight. ?Do not use any products that contain nicotine or tobacco. These products include cigarettes, chewing tobacco, and vaping devices, such as e-cigarettes. If you need help quitting, ask your health care provider. ?Know your numbers. Ask your health care provider to check your cholesterol and your blood sugar (glucose). Continue to have your blood tested as directed by your health care provider. ?Do I need screening for cancer? ?Depending on your health history and family history, you may need to have cancer screenings at different stages of your life. This may include screening for: ?Breast cancer. ?Cervical cancer. ?Lung cancer. ?Colorectal cancer. ?What is my risk for osteoporosis? ?After menopause, you may be   at increased risk for osteoporosis. Osteoporosis is a condition in which bone destruction happens more quickly than new bone creation. To help prevent osteoporosis or  the bone fractures that can happen because of osteoporosis, you may take the following actions: ?If you are 19-50 years old, get at least 1,000 mg of calcium and at least 600 international units (IU) of vitamin D per day. ?If you are older than age 50 but younger than age 70, get at least 1,200 mg of calcium and at least 600 international units (IU) of vitamin D per day. ?If you are older than age 70, get at least 1,200 mg of calcium and at least 800 international units (IU) of vitamin D per day. ?Smoking and drinking excessive alcohol increase the risk of osteoporosis. Eat foods that are rich in calcium and vitamin D, and do weight-bearing exercises several times each week as directed by your health care provider. ?How does menopause affect my mental health? ?Depression may occur at any age, but it is more common as you become older. Common symptoms of depression include: ?Feeling depressed. ?Changes in sleep patterns. ?Changes in appetite or eating patterns. ?Feeling an overall lack of motivation or enjoyment of activities that you previously enjoyed. ?Frequent crying spells. ?Talk with your health care provider if you think that you are experiencing any of these symptoms. ?General instructions ?See your health care provider for regular wellness exams and vaccines. This may include: ?Scheduling regular health, dental, and eye exams. ?Getting and maintaining your vaccines. These include: ?Influenza vaccine. Get this vaccine each year before the flu season begins. ?Pneumonia vaccine. ?Shingles vaccine. ?Tetanus, diphtheria, and pertussis (Tdap) booster vaccine. ?Your health care provider may also recommend other immunizations. ?Tell your health care provider if you have ever been abused or do not feel safe at home. ?Summary ?Menopause is a normal process in which your ability to get pregnant comes to an end. ?This condition causes hot flashes, night sweats, decreased interest in sex, mood swings, headaches, or lack  of sleep. ?Treatment for this condition may include hormone replacement therapy. ?Take actions to keep yourself healthy, including exercising regularly, eating a healthy diet, watching your weight, and checking your blood pressure and blood sugar levels. ?Get screened for cancer and depression. Make sure that you are up to date with all your vaccines. ?This information is not intended to replace advice given to you by your health care provider. Make sure you discuss any questions you have with your health care provider. ?Document Revised: 09/21/2020 Document Reviewed: 09/21/2020 ?Elsevier Patient Education ? 2022 Elsevier Inc. ? ?

## 2021-06-07 NOTE — Assessment & Plan Note (Signed)
Encourage diet and exercise for weight loss 

## 2021-06-07 NOTE — Assessment & Plan Note (Signed)
Lipid profile today Encouraged her to start a baby aspirin OTC daily

## 2021-06-08 NOTE — Addendum Note (Signed)
Addended by: Jearld Fenton on: 06/08/2021 03:08 PM   Modules accepted: Orders

## 2021-06-29 ENCOUNTER — Other Ambulatory Visit: Payer: Self-pay

## 2021-06-29 ENCOUNTER — Ambulatory Visit
Admission: RE | Admit: 2021-06-29 | Discharge: 2021-06-29 | Disposition: A | Discharge status: Home / Self Care | Payer: Medicare Other | Source: Ambulatory Visit | Attending: Internal Medicine | Admitting: Internal Medicine

## 2021-06-29 DIAGNOSIS — Z780 Asymptomatic menopausal state: Secondary | ICD-10-CM | POA: Diagnosis not present

## 2021-06-29 DIAGNOSIS — M85832 Other specified disorders of bone density and structure, left forearm: Secondary | ICD-10-CM | POA: Diagnosis not present

## 2021-06-29 DIAGNOSIS — M85852 Other specified disorders of bone density and structure, left thigh: Secondary | ICD-10-CM | POA: Diagnosis not present

## 2021-07-09 ENCOUNTER — Other Ambulatory Visit: Payer: Medicare Other

## 2021-07-09 DIAGNOSIS — D709 Neutropenia, unspecified: Secondary | ICD-10-CM | POA: Diagnosis not present

## 2021-07-09 LAB — CBC WITH DIFFERENTIAL/PLATELET
Absolute Monocytes: 385 cells/uL (ref 200–950)
Basophils Absolute: 19 cells/uL (ref 0–200)
Basophils Relative: 0.5 %
Eosinophils Absolute: 30 cells/uL (ref 15–500)
Eosinophils Relative: 0.8 %
HCT: 35 % (ref 35.0–45.0)
Hemoglobin: 11.7 g/dL (ref 11.7–15.5)
Lymphs Abs: 1613 cells/uL (ref 850–3900)
MCH: 31.7 pg (ref 27.0–33.0)
MCHC: 33.4 g/dL (ref 32.0–36.0)
MCV: 94.9 fL (ref 80.0–100.0)
MPV: 10.9 fL (ref 7.5–12.5)
Monocytes Relative: 10.4 %
Neutro Abs: 1654 cells/uL (ref 1500–7800)
Neutrophils Relative %: 44.7 %
Platelets: 221 10*3/uL (ref 140–400)
RBC: 3.69 10*6/uL — ABNORMAL LOW (ref 3.80–5.10)
RDW: 12.2 % (ref 11.0–15.0)
Total Lymphocyte: 43.6 %
WBC: 3.7 10*3/uL — ABNORMAL LOW (ref 3.8–10.8)

## 2021-07-13 ENCOUNTER — Other Ambulatory Visit: Payer: Self-pay | Admitting: Internal Medicine

## 2021-07-13 DIAGNOSIS — I10 Essential (primary) hypertension: Secondary | ICD-10-CM

## 2021-07-13 NOTE — Telephone Encounter (Signed)
Rx 12/28/20 #90 2 RF- 9 month supply-too soon Requested Prescriptions  Pending Prescriptions Disp Refills   hydrochlorothiazide (HYDRODIURIL) 12.5 MG tablet [Pharmacy Med Name: hydroCHLOROthiazide 12.5 MG Oral Tablet] 90 tablet 3    Sig: TAKE 1 TABLET BY MOUTH  DAILY     Cardiovascular: Diuretics - Thiazide Passed - 07/13/2021  7:15 AM      Passed - Cr in normal range and within 180 days    Creat  Date Value Ref Range Status  06/07/2021 0.77 0.60 - 1.00 mg/dL Final         Passed - K in normal range and within 180 days    Potassium  Date Value Ref Range Status  06/07/2021 3.9 3.5 - 5.3 mmol/L Final         Passed - Na in normal range and within 180 days    Sodium  Date Value Ref Range Status  06/07/2021 140 135 - 146 mmol/L Final         Passed - Last BP in normal range    BP Readings from Last 1 Encounters:  06/07/21 118/66         Passed - Valid encounter within last 6 months    Recent Outpatient Visits          1 month ago Need for immunization against influenza   [place], Coralie Keens, NP   7 months ago Aortic atherosclerosis [facility])   [facility] [place], Coralie Keens, NP   1 year ago Need for hepatitis C screening test   [place], NP   1 year ago Essential hypertension   [facility], Lupita Raider, [place]   1 year ago Essential hypertension   [facility], Lupita Raider, FNP      Future Appointments            In 3 months  [facility], [place]   In 4 months Susan Moore, Coralie Keens, NP [facility], [place]   In 10 months [place], Gordan Payment [facility]            losartan (COZAAR) 50 MG tablet [Pharmacy Med Name: Losartan Potassium 50 MG Oral Tablet] 90 tablet 3    Sig: TAKE 1 TABLET BY MOUTH  DAILY     Cardiovascular:  Angiotensin Receptor Blockers Passed - 07/13/2021  7:15 AM      Passed - Cr in normal range and  within 180 days    Creat  Date Value Ref Range Status  06/07/2021 0.77 0.60 - 1.00 mg/dL Final         Passed - K in normal range and within 180 days    Potassium  Date Value Ref Range Status  06/07/2021 3.9 3.5 - 5.3 mmol/L Final         Passed - Patient is not pregnant      Passed - Last BP in normal range    BP Readings from Last 1 Encounters:  06/07/21 118/66         Passed - Valid encounter within last 6 months    Recent Outpatient Visits          1 month ago Need for immunization against influenza   [place], Coralie Keens, NP   7 months ago Aortic atherosclerosis [facility])   [facility] [place], Coralie Keens, NP   1 year ago Need  for hepatitis C screening test   [place], NP   1 year ago Essential hypertension   [place], FNP   1 year ago Essential hypertension   [place], FNP      Future Appointments            In 3 months  [facility], [place]   In 4 months [place], Coralie Keens, NP [facility], [place]   In 10 months Ernestine Conrad, Gordan Payment [facility]

## 2021-07-13 NOTE — Addendum Note (Signed)
Addended by: Valli Glance F on: 07/13/2021 03:26 PM   Modules accepted: Orders

## 2021-10-06 ENCOUNTER — Other Ambulatory Visit: Payer: Self-pay | Admitting: Internal Medicine

## 2021-10-06 DIAGNOSIS — K219 Gastro-esophageal reflux disease without esophagitis: Secondary | ICD-10-CM

## 2021-10-07 NOTE — Telephone Encounter (Signed)
Requested Prescriptions  Pending Prescriptions Disp Refills  . omeprazole (PRILOSEC) 40 MG capsule [Pharmacy Med Name: Omeprazole 40 MG Oral Capsule Delayed Release] 90 capsule 0    Sig: TAKE 1 CAPSULE BY MOUTH  DAILY     Gastroenterology: Proton Pump Inhibitors Passed - 10/06/2021  9:26 AM      Passed - Valid encounter within last 12 months    Recent Outpatient Visits          4 months ago Need for immunization against influenza   [place], Coralie Keens, NP   10 months ago Aortic atherosclerosis [facility])   [facility], Coralie Keens, NP   1 year ago Need for hepatitis C screening test   [place], NP   1 year ago Essential hypertension   [place], [place]   1 year ago Essential hypertension   [facility], Lupita Raider, FNP      Future Appointments            In 4 weeks  [facility], [place]   In 2 months [place], Coralie Keens, NP [facility], [place]   In 7 months [place], Gordan Payment [facility]

## 2021-10-14 ENCOUNTER — Other Ambulatory Visit: Payer: Self-pay | Admitting: Internal Medicine

## 2021-10-14 DIAGNOSIS — E7800 Pure hypercholesterolemia, unspecified: Secondary | ICD-10-CM

## 2021-10-15 NOTE — Telephone Encounter (Signed)
Requested medication (s) are due for refill today:   Yes  Requested medication (s) are on the active medication list:   Yes  Future visit scheduled:   Yes 12/06/2021   Last ordered: 12/28/2020 #45, 3 refills  Returned because requesting a 1 yr supply   Requested Prescriptions  Pending Prescriptions Disp Refills   atorvastatin (LIPITOR) 20 MG tablet [Pharmacy Med Name: Atorvastatin Calcium 20 MG Oral Tablet] 50 tablet 2    Sig: TAKE 1 TABLET BY MOUTH EVERY OTHER DAY     Cardiovascular:  Antilipid - Statins Failed - 10/14/2021  9:13 AM      Failed - Lipid Panel in normal range within the last 12 months    Cholesterol  Date Value Ref Range Status  06/07/2021 200 (H) <200 mg/dL Final   LDL Cholesterol (Calc)  Date Value Ref Range Status  06/07/2021 111 (H) mg/dL (calc) Final    Comment:    Reference range: <100 . Desirable range <100 mg/dL for primary prevention;   <70 mg/dL for patients with CHD or diabetic patients  with > or = 2 CHD risk factors. Marland Kitchen LDL-C is now calculated using the Martin-Hopkins  calculation, which is a validated novel method providing  better accuracy than the Friedewald equation in the  estimation of LDL-C.  Cresenciano Genre et al. Annamaria Helling. 1610;960(45): 2061-2068  (http://education.[facility].com/faq/FAQ164)    HDL  Date Value Ref Range Status  06/07/2021 69 > OR = 50 mg/dL Final   Triglycerides  Date Value Ref Range Status  06/07/2021 97 <150 mg/dL Final         Passed - Patient is not pregnant      Passed - Valid encounter within last 12 months    Recent Outpatient Visits           4 months ago Need for immunization against influenza   [place], Coralie Keens, NP   10 months ago Aortic atherosclerosis [facility])   [facility], Coralie Keens, NP   1 year ago Need for hepatitis C screening test   [place], NP   1 year ago Essential hypertension   [place], [place]   1 year ago Essential hypertension   [facility], Lupita Raider, FNP       Future Appointments             In 3 weeks  [facility], [place]   In 1 month [place], Coralie Keens, NP [facility], [place]   In 7 months [place], Shannon A, [place]

## 2021-11-02 ENCOUNTER — Ambulatory Visit: Payer: Medicare Other

## 2021-11-05 ENCOUNTER — Ambulatory Visit (INDEPENDENT_AMBULATORY_CARE_PROVIDER_SITE_OTHER): Payer: Medicare Other

## 2021-11-05 VITALS — BP 126/70 | Ht 59.0 in | Wt 189.6 lb

## 2021-11-05 DIAGNOSIS — Z0000 Encounter for general adult medical examination without abnormal findings: Secondary | ICD-10-CM | POA: Diagnosis not present

## 2021-11-08 DIAGNOSIS — H2513 Age-related nuclear cataract, bilateral: Secondary | ICD-10-CM | POA: Diagnosis not present

## 2021-11-09 DIAGNOSIS — H5213 Myopia, bilateral: Secondary | ICD-10-CM | POA: Diagnosis not present

## 2021-12-06 ENCOUNTER — Encounter: Payer: Self-pay | Admitting: Internal Medicine

## 2021-12-06 ENCOUNTER — Ambulatory Visit (INDEPENDENT_AMBULATORY_CARE_PROVIDER_SITE_OTHER): Payer: Medicare Other | Admitting: Internal Medicine

## 2021-12-06 VITALS — BP 116/78 | HR 71 | Temp 97.7°F | Wt 190.0 lb

## 2021-12-06 DIAGNOSIS — G4700 Insomnia, unspecified: Secondary | ICD-10-CM | POA: Diagnosis not present

## 2021-12-06 DIAGNOSIS — I700 Atherosclerosis of aorta: Secondary | ICD-10-CM

## 2021-12-06 DIAGNOSIS — I10 Essential (primary) hypertension: Secondary | ICD-10-CM

## 2021-12-06 DIAGNOSIS — E7800 Pure hypercholesterolemia, unspecified: Secondary | ICD-10-CM

## 2021-12-06 DIAGNOSIS — K219 Gastro-esophageal reflux disease without esophagitis: Secondary | ICD-10-CM | POA: Diagnosis not present

## 2021-12-06 DIAGNOSIS — R7309 Other abnormal glucose: Secondary | ICD-10-CM | POA: Diagnosis not present

## 2021-12-06 DIAGNOSIS — M159 Polyosteoarthritis, unspecified: Secondary | ICD-10-CM | POA: Diagnosis not present

## 2021-12-06 DIAGNOSIS — Z6838 Body mass index (BMI) 38.0-38.9, adult: Secondary | ICD-10-CM

## 2021-12-06 MED ORDER — OMEPRAZOLE 40 MG PO CPDR: 40.0000 mg | DELAYED_RELEASE_CAPSULE | Freq: Two times a day (BID) | ORAL | 0 refills | Status: DC

## 2021-12-06 NOTE — Assessment & Plan Note (Signed)
Deteriorated Increase omeprazole back to 2 times daily Encourage weight loss as this can help reduce reflux symptoms Advised her to try to identify and avoid foods that trigger her reflux

## 2021-12-06 NOTE — Assessment & Plan Note (Signed)
Advised her of the importance of taking aspirin daily as prescribed Continue atorvastatin C-Met and lipid profile today

## 2021-12-06 NOTE — Assessment & Plan Note (Signed)
She is not interested in medication management at this time

## 2021-12-06 NOTE — Progress Notes (Signed)
Subjective:    Patient ID: Tracey Carroll, female    DOB: 02/10/1941, 81 y.o.   MRN: [id number]  HPI  Patient presents to clinic today for 78-monthfollow-up of chronic conditions.  HTN: Her BP today is 116/78.  She is taking HCTZ and Losartan as prescribed.  ECG from 02/2017 reviewed.  OA: Mainly in her back and knees.  She does not take any medication OTC for this.  She does not follow with orthopedics.  HLD with Aortic Atherosclerosis: Her last LDL was 111, triglycerides 97, 05/2021.  She is taking Atorvastatin every other day.  She is taking Aspirin as prescribed.  She tries to consume a low-fat diet.  GERD: She is having breakthrough on Omeprazole.  Upper GI from 09/2019 reviewed.  Insomnia: She has difficulty falling and staying asleep.  She does not take any medications for this.  There is no sleep study on file.  Review of Systems     Past Medical History:  Diagnosis Date   Acid reflux 10/19/2014   Arthritis    arms   Back pain, chronic 10/19/2014   GERD (gastroesophageal reflux disease)    Hyperlipidemia    Hypertension    Insomnia, persistent 10/19/2014    Current Outpatient Medications  Medication Sig Dispense Refill   aspirin 81 MG EC tablet Take 1 tablet (81 mg total) by mouth daily. Swallow whole. (Patient not taking: Reported on 11/05/2021) 30 tablet 12   atorvastatin (LIPITOR) 20 MG tablet TAKE 1 TABLET BY MOUTH  EVERY OTHER DAY 150 tablet 0   conjugated estrogens (PREMARIN) vaginal cream Apply 0.'5mg'$  (pea-sized amount)  just inside the vaginal introitus with a finger-tip on  Monday, Wednesday and Friday nights. 30 g 12   hydrochlorothiazide (HYDRODIURIL) 12.5 MG tablet TAKE 1 TABLET BY MOUTH  DAILY 90 tablet 3   losartan (COZAAR) 50 MG tablet TAKE 1 TABLET BY MOUTH  DAILY 90 tablet 3   omeprazole (PRILOSEC) 40 MG capsule TAKE 1 CAPSULE BY MOUTH  DAILY 90 capsule 0   No current facility-administered medications for this visit.    No Known  Allergies  Family History  Problem Relation Age of Onset   Diabetes Father        in her 682and 732s  Hypertension Daughter        her 580s  Kidney cancer Neg Hx    Bladder Cancer Neg Hx     Social History   Socioeconomic History   Marital status: Single    Spouse name: Not on file   Number of children: Not on file   Years of education: Not on file   Highest education level: 12th grade  Occupational History   Occupation: retired  Tobacco Use   Smoking status: Never   Smokeless tobacco: Never  Vaping Use   Vaping Use: Never used  Substance and Sexual Activity   Alcohol use: No    Alcohol/week: 0.0 standard drinks of alcohol   Drug use: No   Sexual activity: Not Currently  Other Topics Concern   Not on file  Social History Narrative   ** Merged History Encounter **       Social Determinants of Health   Financial Resource Strain: Low Risk  (11/05/2021)   Overall Financial Resource Strain (CARDIA)    Difficulty of Paying Living Expenses: Not hard at all  Food Insecurity: No Food Insecurity (11/05/2021)   Hunger Vital Sign    Worried About Running Out of Food in  the Last Year: Never true    [place] in the Last Year: Never true  Transportation Needs: No Transportation Needs (11/05/2021)   PRAPARE - Hydrologist (Medical): No    Lack of Transportation (Non-Medical): No  Physical Activity: Inactive (11/05/2021)   Exercise Vital Sign    Days of Exercise per Week: 0 days    Minutes of Exercise per Session: 0 min  Stress: No Stress Concern Present (11/05/2021)   [place]    Feeling of Stress : Not at all  Social Connections: Moderately Isolated (11/05/2021)   Social Connection and Isolation Panel [NHANES]    Frequency of Communication with Friends and Family: More than three times a week    Frequency of Social Gatherings with Friends and Family: Never    Attends  Religious Services: More than 4 times per year    Active Member of [employer] or Organizations: No    Attends Archivist Meetings: Never    Marital Status: Widowed  Intimate Partner Violence: Not At Risk (11/05/2021)   Humiliation, Afraid, Rape, and Kick questionnaire    Fear of Current or Ex-Partner: No    Emotionally Abused: No    Physically Abused: No    Sexually Abused: No     Constitutional: Denies fever, malaise, fatigue, headache or abrupt weight changes.  HEENT: Denies eye pain, eye redness, ear pain, ringing in the ears, wax buildup, runny nose, nasal congestion, bloody nose, or sore throat. Respiratory: Denies difficulty breathing, shortness of breath, cough or sputum production.   Cardiovascular: Denies chest pain, chest tightness, palpitations or swelling in the hands or feet.  Gastrointestinal: Pt reports reflux. Denies abdominal pain, bloating, constipation, diarrhea or blood in the stool.  GU: Denies urgency, frequency, pain with urination, burning sensation, blood in urine, odor or discharge. Musculoskeletal: Patient reports joint pain.  Denies decrease in range of motion, difficulty with gait, muscle pain or joint swelling.  Skin: Denies redness, rashes, lesions or ulcercations.  Neurological: Patient reports insomnia.  Denies dizziness, difficulty with memory, difficulty with speech or problems with balance and coordination.  Psych: Denies anxiety, depression, SI/HI.  No other specific complaints in a complete review of systems (except as listed in HPI above).  Objective:   BP 116/78 (BP Location: Left Arm, Patient Position: Sitting, Cuff Size: Normal)   Pulse 71   Temp 97.7 F (36.5 C) (Temporal)   Wt 190 lb (86.2 kg)   SpO2 100%   BMI 38.38 kg/m   Wt Readings from Last 3 Encounters:  11/05/21 189 lb 9.6 oz (86 kg)  06/07/21 186 lb 9.6 oz (84.6 kg)  05/19/21 190 lb (86.2 kg)    General: Appears her stated age, obese in NAD. Skin: Warm, dry and  intact.  HEENT: Head: normal shape and size; Eyes: sclera white, no icterus, conjunctiva pink, PERRLA and EOMs intact;  Cardiovascular: Normal rate and rhythm. S1,S2 noted.  No murmur, rubs or gallops noted. No JVD or BLE edema. No carotid bruits noted. Pulmonary/Chest: Normal effort and positive vesicular breath sounds. No respiratory distress. No wheezes, rales or ronchi noted.  Abdomen: Soft and nontender. Normal bowel sounds. No distention or masses noted.  Musculoskeletal: No difficulty with gait.  Neurological: Alert and oriented Psychiatric: Mood and affect normal. Behavior is normal. Judgment and thought content normal.    BMET    Component Value Date/Time   NA 140 06/07/2021 0826  K 3.9 06/07/2021 0826   CL 101 06/07/2021 0826   CO2 31 06/07/2021 0826   GLUCOSE 97 06/07/2021 0826   BUN 12 06/07/2021 0826   BUN 14 01/04/2017 1622   CREATININE 0.77 06/07/2021 0826   CALCIUM 9.7 06/07/2021 0826   GFRNONAA 81 08/30/2019 0911   GFRAA 94 08/30/2019 0911    Lipid Panel     Component Value Date/Time   CHOL 200 (H) 06/07/2021 0826   TRIG 97 06/07/2021 0826   HDL 69 06/07/2021 0826   CHOLHDL 2.9 06/07/2021 0826   LDLCALC 111 (H) 06/07/2021 0826    CBC    Component Value Date/Time   WBC 3.7 (L) 07/09/2021 0909   RBC 3.69 (L) 07/09/2021 0909   HGB 11.7 07/09/2021 0909   HCT 35.0 07/09/2021 0909   PLT 221 07/09/2021 0909   MCV 94.9 07/09/2021 0909   MCH 31.7 07/09/2021 0909   MCHC 33.4 07/09/2021 0909   RDW 12.2 07/09/2021 0909   LYMPHSABS 1,613 07/09/2021 0909   EOSABS 30 07/09/2021 0909   BASOSABS 19 07/09/2021 0909    Hgb A1C Lab Results  Component Value Date   HGBA1C 5.2 09/11/2017            Assessment & Plan:    RTC in 6 months for your annual exam Webb Silversmith, NP

## 2021-12-06 NOTE — Assessment & Plan Note (Signed)
C-Met and lipid profile today Encouraged her to consume low-fat diet Continue atorvastatin 

## 2021-12-06 NOTE — Assessment & Plan Note (Signed)
Encourage weight loss as this can help reduce joint pain Okay to take Tylenol OTC as needed for arthritic pain

## 2021-12-06 NOTE — Assessment & Plan Note (Signed)
Encourage diet and exercise for weight loss 

## 2021-12-06 NOTE — Assessment & Plan Note (Signed)
Continue losartan and HCTZ Reinforced DASH diet and exercise for weight loss C-Met today

## 2021-12-06 NOTE — Patient Instructions (Signed)

## 2021-12-07 LAB — COMPLETE METABOLIC PANEL WITH GFR
AG Ratio: 1.4 (calc) (ref 1.0–2.5)
ALT: 13 U/L (ref 6–29)
AST: 21 U/L (ref 10–35)
Albumin: 4.2 g/dL (ref 3.6–5.1)
Alkaline phosphatase (APISO): 58 U/L (ref 37–153)
BUN: 12 mg/dL (ref 7–25)
CO2: 26 mmol/L (ref 20–32)
Calcium: 9.4 mg/dL (ref 8.6–10.4)
Chloride: 102 mmol/L (ref 98–110)
Creat: 0.71 mg/dL (ref 0.60–1.00)
Globulin: 3 g/dL (calc) (ref 1.9–3.7)
Glucose, Bld: 93 mg/dL (ref 65–99)
Potassium: 3.8 mmol/L (ref 3.5–5.3)
Sodium: 140 mmol/L (ref 135–146)
Total Bilirubin: 0.6 mg/dL (ref 0.2–1.2)
Total Protein: 7.2 g/dL (ref 6.1–8.1)
eGFR: 87 mL/min/{1.73_m2} (ref >=60)

## 2021-12-07 LAB — LIPID PANEL
Cholesterol: 185 mg/dL (ref <200)
HDL: 74 mg/dL (ref >=50)
LDL Cholesterol (Calc): 95 mg/dL (calc)
Non-HDL Cholesterol (Calc): 111 mg/dL (calc) (ref <130)
Total CHOL/HDL Ratio: 2.5 (calc) (ref <5.0)
Triglycerides: 75 mg/dL (ref <150)

## 2021-12-07 LAB — HEMOGLOBIN A1C
Hgb A1c MFr Bld: 5 % of total Hgb (ref <5.7)
Mean Plasma Glucose: 97 mg/dL
eAG (mmol/L): 5.4 mmol/L

## 2021-12-24 DIAGNOSIS — H524 Presbyopia: Secondary | ICD-10-CM | POA: Diagnosis not present

## 2021-12-30 ENCOUNTER — Other Ambulatory Visit: Payer: Self-pay | Admitting: Internal Medicine

## 2021-12-30 DIAGNOSIS — K219 Gastro-esophageal reflux disease without esophagitis: Secondary | ICD-10-CM

## 2021-12-30 NOTE — Telephone Encounter (Signed)
Requested Prescriptions  Pending Prescriptions Disp Refills  . omeprazole (PRILOSEC) 40 MG capsule [Pharmacy Med Name: Omeprazole 40 MG Oral Capsule Delayed Release] 90 capsule 3    Sig: TAKE 1 CAPSULE BY MOUTH DAILY     Gastroenterology: Proton Pump Inhibitors Passed - 12/30/2021  8:30 AM      Passed - Valid encounter within last 12 months    Recent Outpatient Visits          3 weeks ago Pure hypercholesterolemia   [place], NP   6 months ago Need for immunization against influenza   [facility] [place], Coralie Keens, NP   1 year ago Aortic atherosclerosis [facility])   [facility], Coralie Keens, NP   1 year ago Need for hepatitis C screening test   [place], NP   1 year ago Essential hypertension   [facility], Lupita Raider, FNP      Future Appointments            In 4 months [place], [place] [facility] [place]   In 5 months [place], Coralie Keens, NP [facility], [facility]

## 2022-01-06 ENCOUNTER — Other Ambulatory Visit: Payer: Self-pay

## 2022-01-06 DIAGNOSIS — E7800 Pure hypercholesterolemia, unspecified: Secondary | ICD-10-CM

## 2022-01-06 MED ORDER — ATORVASTATIN CALCIUM 20 MG PO TABS: 20.0000 mg | ORAL_TABLET | ORAL | 0 refills | Status: DC

## 2022-03-01 ENCOUNTER — Other Ambulatory Visit: Payer: Self-pay

## 2022-03-01 DIAGNOSIS — K219 Gastro-esophageal reflux disease without esophagitis: Secondary | ICD-10-CM

## 2022-03-01 MED ORDER — OMEPRAZOLE 40 MG PO CPDR: 40.0000 mg | DELAYED_RELEASE_CAPSULE | Freq: Every day | ORAL | 1 refills | Status: DC

## 2022-03-10 ENCOUNTER — Other Ambulatory Visit: Payer: Self-pay | Admitting: Internal Medicine

## 2022-03-10 DIAGNOSIS — E7800 Pure hypercholesterolemia, unspecified: Secondary | ICD-10-CM

## 2022-03-10 NOTE — Telephone Encounter (Signed)
Requested Prescriptions  Pending Prescriptions Disp Refills  . atorvastatin (LIPITOR) 20 MG tablet [Pharmacy Med Name: Atorvastatin Calcium 20 MG Oral Tablet] 45 tablet 0    Sig: TAKE 1 TABLET BY MOUTH EVERY  OTHER DAY     Cardiovascular:  Antilipid - Statins Failed - 03/10/2022  7:45 AM      Failed - Lipid Panel in normal range within the last 12 months    Cholesterol  Date Value Ref Range Status  12/06/2021 185 <200 mg/dL Final   LDL Cholesterol (Calc)  Date Value Ref Range Status  12/06/2021 95 mg/dL (calc) Final    Comment:    Reference range: <100 . Desirable range <100 mg/dL for primary prevention;   <70 mg/dL for patients with CHD or diabetic patients  with > or = 2 CHD risk factors. Marland Kitchen LDL-C is now calculated using the Martin-Hopkins  calculation, which is a validated novel method providing  better accuracy than the Friedewald equation in the  estimation of LDL-C.  Cresenciano Genre et al. Annamaria Helling. 0388;828(00): 2061-2068  (http://education.[facility].com/faq/FAQ164)    HDL  Date Value Ref Range Status  12/06/2021 74 > OR = 50 mg/dL Final   Triglycerides  Date Value Ref Range Status  12/06/2021 75 <150 mg/dL Final         Passed - Patient is not pregnant      Passed - Valid encounter within last 12 months    Recent Outpatient Visits          3 months ago Pure hypercholesterolemia   [place], NP   9 months ago Need for immunization against influenza   [facility] [place], Coralie Keens, NP   1 year ago Aortic atherosclerosis [facility])   [facility], Coralie Keens, NP   1 year ago Need for hepatitis C screening test   [place], NP   1 year ago Essential hypertension   [facility], Lupita Raider, FNP      Future Appointments            In 2 months [place], Shannon A, [place]   In 3 months [place], Coralie Keens, NP [facility], [facility]

## 2022-05-19 ENCOUNTER — Ambulatory Visit: Payer: Medicare Other | Admitting: Urology

## 2022-05-23 NOTE — Progress Notes (Deleted)
05/24/2022 11:36 AM   Tracey Carroll 18-Jun-1940 [id number]  Referring provider: Jearld Fenton, NP [address] [place],  [place] [address]  Urological history: 1.  High risk hematuria -Non-smoker -hematuria work up in 2015 and 2018 with CTU and cystoscopy - NED -no reports of gross heme -UA ***  2. Vaginal atrophy -vaginal estrogen cream three nights weekly   HPI: Tracey Carroll is a 82 y.o. female who presents today for a yearly visit.     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/19/2014    Surgical History: Past Surgical History:  Procedure Laterality Date   ESOPHAGOGASTRODUODENOSCOPY (EGD) WITH PROPOFOL N/A 11/20/2014   Procedure: ESOPHAGOGASTRODUODENOSCOPY (EGD) WITH PROPOFOL with dilation;  Surgeon: Lucilla Lame, MD;  Location: [place];  Service: Endoscopy;  Laterality: N/A;   ESOPHAGOGASTRODUODENOSCOPY (EGD) WITH PROPOFOL N/A 09/17/2019   Procedure: ESOPHAGOGASTRODUODENOSCOPY (EGD) WITH PROPOFOL;  Surgeon: Virgel Manifold, MD;  Location: [facility];  Service: Endoscopy;  Laterality: N/A;   TUBAL LIGATION      Home Medications:  Allergies as of 05/24/2022   No Known Allergies      Medication List        Accurate as of May 23, 2022 11:36 AM. If you have any questions, ask your nurse or doctor.          aspirin EC 81 MG tablet Take 1 tablet (81 mg total) by mouth daily. Swallow whole.   atorvastatin 20 MG tablet Commonly known as: LIPITOR TAKE 1 TABLET BY MOUTH EVERY  OTHER DAY   hydrochlorothiazide 12.5 MG tablet Commonly known as: HYDRODIURIL TAKE 1 TABLET BY MOUTH  DAILY   losartan 50 MG tablet Commonly known as: COZAAR TAKE 1 TABLET BY MOUTH  DAILY   omeprazole 40 MG capsule Commonly known as: PRILOSEC Take 1 capsule (40 mg total) by  mouth daily.   Premarin vaginal cream Generic drug: conjugated estrogens Apply 0.'5mg'$  (pea-sized amount)  just inside the vaginal introitus with a finger-tip on  Monday, Wednesday and Friday nights.        Allergies: No Known Allergies  Family History: Family History  Problem Relation Age of Onset   Diabetes Father        in her 64 and 79s   Hypertension Daughter        her 71s   Kidney cancer Neg Hx    Bladder Cancer Neg Hx     Social History:  reports that she has never smoked. She has never used smokeless tobacco. She reports that she does not drink alcohol and does not use drugs.  ROS: For pertinent review of systems please refer to history of present illness  Physical Exam: There were no vitals taken for this visit.  Constitutional:  Well nourished. Alert and oriented, No acute distress. HEENT: [place] AT, moist mucus membranes.  Trachea midline, no masses. Cardiovascular: No clubbing, cyanosis, or edema. Respiratory: Normal respiratory effort, no increased work of breathing. GU: No CVA tenderness.  No bladder fullness or masses. Vulvovaginal atrophy w/ pallor, loss of rugae, introital retraction, excoriations.  Vulvar thinning, fusion of labia, clitoral hood retraction, prominent urethral meatus.   *** external genitalia, *** pubic hair distribution, no lesions.  Normal urethral meatus, no lesions, no prolapse, no discharge.  No urethral masses, tenderness and/or tenderness. No bladder fullness, tenderness or masses. *** vagina mucosa, *** estrogen effect, no discharge, no lesions, *** pelvic support, *** cystocele and *** rectocele noted.  No cervical motion tenderness.  Uterus is freely mobile and non-fixed.  No adnexal/parametria masses or tenderness noted.  Anus and perineum are without rashes or lesions.   ***  Neurologic: Grossly intact, no focal deficits, moving all 4 extremities. Psychiatric: Normal mood and affect.     Laboratory Data: Hemoglobin A1c (11/2021)  5.0 Serum creatinine (11/2021) 0.71 Urinalysis See EPIC and HPI  I have reviewed the labs.  Pertinent imaging N/A   Assessment & Plan:    1. Vaginal atrophy -continue vaginal estrogen cream three nights weekly -refill sent to pharmacy  2.  History of high risk hematuria -Work-up was completed in 2015 and 2018-NED -No reports of gross hematuria -UA today ***  3. Nocturia -minimal bother  No follow-ups on file.  These notes generated with voice recognition software. I apologize for typographical errors.  [place], [place] [address] [place], [place] [address] [phone]

## 2022-05-24 ENCOUNTER — Other Ambulatory Visit: Payer: Self-pay | Admitting: Internal Medicine

## 2022-05-24 ENCOUNTER — Ambulatory Visit: Payer: Medicare Other | Admitting: Urology

## 2022-05-24 DIAGNOSIS — R351 Nocturia: Secondary | ICD-10-CM

## 2022-05-24 DIAGNOSIS — R319 Hematuria, unspecified: Secondary | ICD-10-CM

## 2022-05-24 DIAGNOSIS — N952 Postmenopausal atrophic vaginitis: Secondary | ICD-10-CM

## 2022-05-24 DIAGNOSIS — K219 Gastro-esophageal reflux disease without esophagitis: Secondary | ICD-10-CM

## 2022-05-24 NOTE — Telephone Encounter (Signed)
Refused Omeprazole 40 mg because requested too soon.

## 2022-06-01 NOTE — Progress Notes (Signed)
06/02/2022 1:59 PM   Tracey Carroll 1941/04/24 [id number]  Referring provider: Jearld Fenton, NP [address] [place],  [place] [address]  Urological history: 1.  High risk hematuria -Non-smoker -hematuria work up in 2015 and 2018 with CTU and cystoscopy - NED -no reports of gross heme -UA (05/2022) 3-10 RBC's  2. Vaginal atrophy -vaginal estrogen cream three nights weekly   HPI: Tracey Carroll is a 82 y.o. female who presents today for a yearly visit.    UA yellow slightly cloudy, specific 81.015, trace blood, pH 7.0, 3+ leukocyte, greater than 30 WBCs, 3-10 RBCs, 0-2 epithelial cells, 0-10 epithelial cells, nonspecific crystals are present and moderate bacteria.  She has been experiencing a lot of itching in her vaginal area for the last three days.  She has not noticed a discharge and thought it might be from her new laundry detergent.   Patient denies any modifying or aggravating factors.  Patient denies any gross hematuria, dysuria or suprapubic/flank pain.  Patient denies any fevers, chills, nausea or vomiting.    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/19/2014    Surgical History: Past Surgical History:  Procedure Laterality Date   ESOPHAGOGASTRODUODENOSCOPY (EGD) WITH PROPOFOL N/A 11/20/2014   Procedure: ESOPHAGOGASTRODUODENOSCOPY (EGD) WITH PROPOFOL with dilation;  Surgeon: Lucilla Lame, MD;  Location: [place];  Service: Endoscopy;  Laterality: N/A;   ESOPHAGOGASTRODUODENOSCOPY (EGD) WITH PROPOFOL N/A 09/17/2019   Procedure: ESOPHAGOGASTRODUODENOSCOPY (EGD) WITH PROPOFOL;  Surgeon: Virgel Manifold, MD;  Location: [facility];  Service: Endoscopy;  Laterality: N/A;   TUBAL LIGATION      Home Medications:  Allergies as of 06/02/2022   No Known Allergies       Medication List        Accurate as of June 02, 2022  1:59 PM. If you have any questions, ask your nurse or doctor.          STOP taking these medications    aspirin EC 81 MG tablet Stopped by: Shaleigh Laubscher, PA-C       TAKE these medications    atorvastatin 20 MG tablet Commonly known as: LIPITOR TAKE 1 TABLET BY MOUTH EVERY  OTHER DAY   hydrochlorothiazide 12.5 MG tablet Commonly known as: HYDRODIURIL TAKE 1 TABLET BY MOUTH DAILY   losartan 50 MG tablet Commonly known as: COZAAR TAKE 1 TABLET BY MOUTH DAILY   omeprazole 40 MG capsule Commonly known as: PRILOSEC Take 1 capsule (40 mg total) by mouth daily.   Premarin vaginal cream Generic drug: conjugated estrogens Apply 0.'5mg'$  (pea-sized amount)  just inside the vaginal introitus with a finger-tip on  Monday, Wednesday and Friday nights.        Allergies: No Known Allergies  Family History: Family History  Problem Relation Age of Onset   Diabetes Father        in her 55 and 47s   Hypertension Daughter        her 13s   Kidney cancer Neg Hx    Bladder Cancer Neg Hx     Social History:  reports that she has never smoked. She has never used smokeless tobacco. She reports that she does not drink alcohol and does not use drugs.  ROS: For pertinent review of systems  please refer to history of present illness  Physical Exam: BP 133/81 (BP Location: Left Arm, Patient Position: Sitting, Cuff Size: Large)   Pulse 93   Ht '4\' 11"'$  (1.499 m)   Wt 184 lb 11.2 oz (83.8 kg)   BMI 37.30 kg/m   Constitutional:  Well nourished. Alert and oriented, No acute distress. HEENT: [place] AT, moist mucus membranes.  Trachea midline Cardiovascular: No clubbing, cyanosis, or edema. Respiratory: Normal respiratory effort, no increased work of breathing. GU: No CVA tenderness.  No bladder fullness or masses. Vulvovaginal atrophy w/ pallor, loss of rugae, introital retraction.  Normal urethral meatus, no lesions, no prolapse,  no discharge.   No urethral masses, tenderness and/or tenderness. No bladder fullness, tenderness or masses. Grade I-II cystocele.  Anus and perineum are without rashes or lesions.    Neurologic: Grossly intact, no focal deficits, moving all 4 extremities. Psychiatric: Normal mood and affect.     Laboratory Data: Hemoglobin A1c (11/2021) 5.0 Serum creatinine (11/2021) 0.71 Urinalysis See EPIC and HPI  I have reviewed the labs.  Pertinent imaging N/A   Assessment & Plan:    1. Vaginal atrophy -continue vaginal estrogen cream three nights weekly -refill sent to pharmacy  2.  History of high risk hematuria -Work-up was completed in 2015 and 2018-NED -No reports of gross hematuria -UA today 3-10 RBC's  3. Nocturia -minimal bother  4. Suspected UTI -patient w/ itching in the vaginal area -No discharge seen on pelvic exam -Compliant with vaginal estrogen cream -UA with pyuria, hematuria and bacteria -Urine sent for culture -Will prescribe antibiotic if urine culture is positive for infection, if not we will need to consider repeating hematuria workup  No follow-ups on file.  These notes generated with voice recognition software. I apologize for typographical errors.  [place], [place] [address] [place], [place] [address] [phone]

## 2022-06-02 ENCOUNTER — Other Ambulatory Visit: Payer: Self-pay | Admitting: Internal Medicine

## 2022-06-02 ENCOUNTER — Ambulatory Visit (INDEPENDENT_AMBULATORY_CARE_PROVIDER_SITE_OTHER): Payer: 59 | Admitting: Urology

## 2022-06-02 ENCOUNTER — Encounter: Payer: Self-pay | Admitting: Urology

## 2022-06-02 VITALS — BP 133/81 | HR 93 | Ht 59.0 in | Wt 184.7 lb

## 2022-06-02 DIAGNOSIS — R3989 Other symptoms and signs involving the genitourinary system: Secondary | ICD-10-CM

## 2022-06-02 DIAGNOSIS — R351 Nocturia: Secondary | ICD-10-CM

## 2022-06-02 DIAGNOSIS — I10 Essential (primary) hypertension: Secondary | ICD-10-CM

## 2022-06-02 DIAGNOSIS — N952 Postmenopausal atrophic vaginitis: Secondary | ICD-10-CM

## 2022-06-02 DIAGNOSIS — R319 Hematuria, unspecified: Secondary | ICD-10-CM | POA: Diagnosis not present

## 2022-06-02 NOTE — Telephone Encounter (Signed)
Requested Prescriptions  Pending Prescriptions Disp Refills   losartan (COZAAR) 50 MG tablet [Pharmacy Med Name: Losartan Potassium 50 MG Oral Tablet] 30 tablet 3    Sig: TAKE 1 TABLET BY MOUTH DAILY     Cardiovascular:  Angiotensin Receptor Blockers Passed - 06/02/2022  7:37 AM      Passed - Cr in normal range and within 180 days    Creat  Date Value Ref Range Status  12/06/2021 0.71 0.60 - 1.00 mg/dL Final         Passed - K in normal range and within 180 days    Potassium  Date Value Ref Range Status  12/06/2021 3.8 3.5 - 5.3 mmol/L Final         Passed - Patient is not pregnant      Passed - Last BP in normal range    BP Readings from Last 1 Encounters:  12/06/21 116/78         Passed - Valid encounter within last 6 months    Recent Outpatient Visits           5 months ago Pure hypercholesterolemia   [place], Coralie Keens, NP   12 months ago Need for immunization against influenza   [facility] [place], Coralie Keens, NP   1 year ago Aortic atherosclerosis [facility])   [facility] [place], Coralie Keens, NP   1 year ago Need for hepatitis C screening test   [place], NP   2 years ago Essential hypertension   [facility], Lupita Raider, FNP       Future Appointments             Today McGowan, Shannon A, [place]   In 6 days [place], Coralie Keens, NP [facility]             hydrochlorothiazide (HYDRODIURIL) 12.5 MG tablet [Pharmacy Med Name: hydroCHLOROthiazide 12.5 MG Oral Tablet] 30 tablet 3    Sig: TAKE 1 TABLET BY MOUTH DAILY     Cardiovascular: Diuretics - Thiazide Passed - 06/02/2022  7:37 AM      Passed - Cr in normal range and within 180 days    Creat  Date Value Ref Range Status  12/06/2021 0.71 0.60 - 1.00 mg/dL Final         Passed - K in normal range and within 180 days    Potassium  Date Value Ref Range Status   12/06/2021 3.8 3.5 - 5.3 mmol/L Final         Passed - Na in normal range and within 180 days    Sodium  Date Value Ref Range Status  12/06/2021 140 135 - 146 mmol/L Final         Passed - Last BP in normal range    BP Readings from Last 1 Encounters:  12/06/21 116/78         Passed - Valid encounter within last 6 months    Recent Outpatient Visits           5 months ago Pure hypercholesterolemia   [place], Coralie Keens, NP   12 months ago Need for immunization against influenza   [place], Coralie Keens, NP   1 year ago Aortic atherosclerosis [facility])   [facility], Coralie Keens, NP   1 year ago Need for  hepatitis C screening test   [facility] Kathrine Haddock, NP   2 years ago Essential hypertension   [facility], Lupita Raider, FNP       Future Appointments             Today McGowan, Gordan [place]   In 6 days [place], Coralie Keens, NP [facility], [facility]

## 2022-06-03 LAB — MICROSCOPIC EXAMINATION: WBC, UA: >30 /hpf — AB (ref 0–5)

## 2022-06-03 LAB — URINALYSIS, COMPLETE
Bilirubin, UA: NEGATIVE
Glucose, UA: NEGATIVE
Ketones, UA: NEGATIVE
Nitrite, UA: NEGATIVE
Protein,UA: NEGATIVE
Specific Gravity, UA: 1.015 (ref 1.005–1.030)
Urobilinogen, Ur: 1 mg/dL (ref 0.2–1.0)
pH, UA: 7 (ref 5.0–7.5)

## 2022-06-04 LAB — CULTURE, URINE COMPREHENSIVE

## 2022-06-06 ENCOUNTER — Telehealth: Payer: Self-pay | Admitting: Family Medicine

## 2022-06-06 NOTE — Telephone Encounter (Signed)
-----  Message from Nori Riis, PA-C sent at 06/05/2022  6:27 PM EST ----- Please let Tracey Carroll know that her urine culture was negative for infection.  I would recommend undergoing a repeat hematuria work up with CT urogram and cystoscopy with Dr. Erlene Quan.

## 2022-06-06 NOTE — Telephone Encounter (Signed)
Unable to leave message, voicemail not set up.

## 2022-06-07 ENCOUNTER — Other Ambulatory Visit: Payer: Self-pay | Admitting: Family Medicine

## 2022-06-07 DIAGNOSIS — R319 Hematuria, unspecified: Secondary | ICD-10-CM

## 2022-06-07 NOTE — Telephone Encounter (Signed)
Patient notified, CT scan ordered and cysto scheduled.

## 2022-06-08 ENCOUNTER — Ambulatory Visit: Payer: 59 | Admitting: Internal Medicine

## 2022-06-23 ENCOUNTER — Other Ambulatory Visit: Payer: Self-pay | Admitting: Internal Medicine

## 2022-06-23 DIAGNOSIS — E7800 Pure hypercholesterolemia, unspecified: Secondary | ICD-10-CM

## 2022-06-23 NOTE — Telephone Encounter (Signed)
Requested Prescriptions  Pending Prescriptions Disp Refills   atorvastatin (LIPITOR) 20 MG tablet [Pharmacy Med Name: Atorvastatin Calcium 20 MG Oral Tablet] 45 tablet 1    Sig: TAKE 1 TABLET BY MOUTH EVERY  OTHER DAY     Cardiovascular:  Antilipid - Statins Failed - 06/23/2022  7:57 AM      Failed - Lipid Panel in normal range within the last 12 months    Cholesterol  Date Value Ref Range Status  12/06/2021 185 <200 mg/dL Final   LDL Cholesterol (Calc)  Date Value Ref Range Status  12/06/2021 95 mg/dL (calc) Final    Comment:    Reference range: <100 . Desirable range <100 mg/dL for primary prevention;   <70 mg/dL for patients with CHD or diabetic patients  with > or = 2 CHD risk factors. Marland Kitchen LDL-C is now calculated using the Martin-Hopkins  calculation, which is a validated novel method providing  better accuracy than the Friedewald equation in the  estimation of LDL-C.  Cresenciano Genre et al. Annamaria Helling. 2703;500(93): 2061-2068  (http://education.[employer].com/faq/FAQ164)    HDL  Date Value Ref Range Status  12/06/2021 74 > OR = 50 mg/dL Final   Triglycerides  Date Value Ref Range Status  12/06/2021 75 <150 mg/dL Final         Passed - Patient is not pregnant      Passed - Valid encounter within last 12 months    Recent Outpatient Visits           6 months ago Pure hypercholesterolemia   [facility] [place], Coralie Keens, NP   1 year ago Need for immunization against influenza   [facility] [place], Coralie Keens, NP   1 year ago Aortic atherosclerosis [facility])   [facility], Coralie Keens, NP   1 year ago Need for hepatitis C screening test   [facility] Kath[facility] Haddock, NP   2 years ago Essential hypertension   [facility] [place], Lupita Raider, [place]

## 2022-07-06 ENCOUNTER — Ambulatory Visit
Admission: RE | Admit: 2022-07-06 | Discharge: 2022-07-06 | Disposition: A | Discharge status: Home / Self Care | Payer: 59 | Source: Ambulatory Visit | Attending: Urology | Admitting: Urology

## 2022-07-06 DIAGNOSIS — R319 Hematuria, unspecified: Secondary | ICD-10-CM | POA: Diagnosis not present

## 2022-07-06 DIAGNOSIS — K5730 Diverticulosis of large intestine without perforation or abscess without bleeding: Secondary | ICD-10-CM | POA: Diagnosis not present

## 2022-07-06 MED ORDER — IOHEXOL 300 MG/ML  SOLN
125.0000 mL | Freq: Once | INTRAMUSCULAR | Status: AC | PRN
  Administered 2022-07-06: 125 mL via INTRAVENOUS

## 2022-07-13 ENCOUNTER — Other Ambulatory Visit: Payer: 59 | Admitting: Urology

## 2022-07-27 ENCOUNTER — Other Ambulatory Visit: Payer: Self-pay | Admitting: Internal Medicine

## 2022-07-27 DIAGNOSIS — K219 Gastro-esophageal reflux disease without esophagitis: Secondary | ICD-10-CM

## 2022-07-27 NOTE — Telephone Encounter (Signed)
Rx 03/01/22 #90 1RF- too soon Requested Prescriptions  Pending Prescriptions Disp Refills   omeprazole (PRILOSEC) 40 MG capsule [Pharmacy Med Name: Omeprazole 40 MG Oral Capsule Delayed Release] 80 capsule 3    Sig: TAKE 1 CAPSULE BY MOUTH DAILY     Gastroenterology: Proton Pump Inhibitors Passed - 07/27/2022  7:34 AM      Passed - Valid encounter within last 12 months    Recent Outpatient Visits           7 months ago Pure hypercholesterolemia   [facility] [place], Coralie Keens, NP   1 year ago Need for immunization against influenza   [facility] [place], Coralie Keens, NP   1 year ago Aortic atherosclerosis [facility])   [facility] [place], Coralie Keens, NP   2 years ago Need for hepatitis C screening test   [facility] Kathrine Haddock, NP   2 years ago Essential hypertension   [facility] [place], Lupita Raider, [place]

## 2022-08-12 ENCOUNTER — Other Ambulatory Visit: Payer: Self-pay | Admitting: Internal Medicine

## 2022-08-12 DIAGNOSIS — K219 Gastro-esophageal reflux disease without esophagitis: Secondary | ICD-10-CM

## 2022-08-12 NOTE — Telephone Encounter (Signed)
Requested Prescriptions  Pending Prescriptions Disp Refills   omeprazole (PRILOSEC) 40 MG capsule [Pharmacy Med Name: Omeprazole 40 MG Oral Capsule Delayed Release] 100 capsule 0    Sig: TAKE 1 CAPSULE BY MOUTH DAILY     Gastroenterology: Proton Pump Inhibitors Passed - 08/12/2022 11:18 AM      Passed - Valid encounter within last 12 months    Recent Outpatient Visits           8 months ago Pure hypercholesterolemia   [facility] [place], Coralie Keens, NP   1 year ago Need for immunization against influenza   [facility] [place], Coralie Keens, NP   1 year ago Aortic atherosclerosis [facility])   [facility] [place], Coralie Keens, NP   2 years ago Need for hepatitis C screening test   [facility] Kath[facility] Haddock, NP   2 years ago Essential hypertension   [facility] [place], Lupita Raider, [place]

## 2022-10-16 ENCOUNTER — Other Ambulatory Visit: Payer: Self-pay | Admitting: Internal Medicine

## 2022-10-16 DIAGNOSIS — K219 Gastro-esophageal reflux disease without esophagitis: Secondary | ICD-10-CM

## 2022-10-18 NOTE — Telephone Encounter (Signed)
Requested Prescriptions  Pending Prescriptions Disp Refills   omeprazole (PRILOSEC) 40 MG capsule [Pharmacy Med Name: Omeprazole 40 MG Oral Capsule Delayed Release] 100 capsule 0    Sig: TAKE 1 CAPSULE BY MOUTH DAILY     Gastroenterology: Proton Pump Inhibitors Passed - 10/16/2022 10:27 PM      Passed - Valid encounter within last 12 months    Recent Outpatient Visits           10 months ago Pure hypercholesterolemia   [place] [facility] [place], Salvadore Oxford, NP   1 year ago Need for immunization against influenza   [place] [facility] [place], Salvadore Oxford, NP   1 year ago Aortic atherosclerosis [facility])   [place] [facility] [place], Salvadore Oxford, NP   2 years ago Need for hepatitis C screening test   [place] [facility] Gabriel Cirri, NP   2 years ago Essential hypertension   [place] [facility] [place], Jodelle Gross, [place]

## 2022-10-20 ENCOUNTER — Other Ambulatory Visit: Payer: Self-pay | Admitting: Internal Medicine

## 2022-10-20 DIAGNOSIS — I10 Essential (primary) hypertension: Secondary | ICD-10-CM

## 2022-10-20 NOTE — Telephone Encounter (Signed)
Attempted to call patient to schedule appointment with provider- left message to call back for appointment. Courtesy 30 day RF given Requested Prescriptions  Pending Prescriptions Disp Refills   hydrochlorothiazide (HYDRODIURIL) 12.5 MG tablet [Pharmacy Med Name: hydroCHLOROthiazide 12.5 MG Oral Tablet] 100 tablet 2    Sig: TAKE 1 TABLET BY MOUTH DAILY     Cardiovascular: Diuretics - Thiazide Failed - 10/20/2022  4:56 AM      Failed - Cr in normal range and within 180 days    Creat  Date Value Ref Range Status  12/06/2021 0.71 0.60 - 1.00 mg/dL Final         Failed - K in normal range and within 180 days    Potassium  Date Value Ref Range Status  12/06/2021 3.8 3.5 - 5.3 mmol/L Final         Failed - Na in normal range and within 180 days    Sodium  Date Value Ref Range Status  12/06/2021 140 135 - 146 mmol/L Final         Failed - Valid encounter within last 6 months    Recent Outpatient Visits           10 months ago Pure hypercholesterolemia   [place] [facility] [place], Salvadore Oxford, NP   1 year ago Need for immunization against influenza   [place] [facility] [place], Salvadore Oxford, NP   1 year ago Aortic atherosclerosis [facility])   [place] [facility] [place], [place], NP   2 years ago Need for hepatitis C screening test   [facility] [place], [place], NP   2 years ago Essential hypertension   [facility] [place], Jodelle Gross, [place]              Passed - Last BP in normal range    BP Readings from Last 1 Encounters:  06/02/22 133/81          losartan (COZAAR) 50 MG tablet [Pharmacy Med Name: Losartan Potassium 50 MG Oral Tablet] 100 tablet 2    Sig: TAKE 1 TABLET BY MOUTH DAILY     Cardiovascular:  Angiotensin Receptor Blockers Failed - 10/20/2022  4:56 AM      Failed - Cr in normal range and within 180 days    Creat  Date Value Ref Range Status  12/06/2021 0.71 0.60  - 1.00 mg/dL Final         Failed - K in normal range and within 180 days    Potassium  Date Value Ref Range Status  12/06/2021 3.8 3.5 - 5.3 mmol/L Final         Failed - Valid encounter within last 6 months    Recent Outpatient Visits           10 months ago Pure hypercholesterolemia   [place] [facility] [place], Salvadore Oxford, NP   1 year ago Need for immunization against influenza   [place] [facility] [place], Salvadore Oxford, NP   1 year ago Aortic atherosclerosis [facility])   [place] [facility] [place], Salvadore Oxford, NP   2 years ago Need for hepatitis C screening test   [facility] Gabriel Cirri, NP   2 years ago Essential hypertension    [facility] [place], Jodelle Gross, [place]  Passed - Patient is not pregnant      Passed - Last BP in normal range    BP Readings from Last 1 Encounters:  06/02/22 133/81

## 2022-10-21 ENCOUNTER — Ambulatory Visit (INDEPENDENT_AMBULATORY_CARE_PROVIDER_SITE_OTHER): Payer: 59 | Admitting: Internal Medicine

## 2022-10-21 ENCOUNTER — Encounter: Payer: Self-pay | Admitting: Internal Medicine

## 2022-10-21 VITALS — BP 118/68 | HR 60 | Temp 96.6°F | Ht 59.0 in | Wt 177.0 lb

## 2022-10-21 DIAGNOSIS — Z0001 Encounter for general adult medical examination with abnormal findings: Secondary | ICD-10-CM | POA: Diagnosis not present

## 2022-10-21 DIAGNOSIS — M25551 Pain in right hip: Secondary | ICD-10-CM | POA: Diagnosis not present

## 2022-10-21 DIAGNOSIS — E7800 Pure hypercholesterolemia, unspecified: Secondary | ICD-10-CM | POA: Diagnosis not present

## 2022-10-21 DIAGNOSIS — R7309 Other abnormal glucose: Secondary | ICD-10-CM | POA: Diagnosis not present

## 2022-10-21 DIAGNOSIS — Z6835 Body mass index (BMI) 35.0-35.9, adult: Secondary | ICD-10-CM

## 2022-10-21 DIAGNOSIS — Z23 Encounter for immunization: Secondary | ICD-10-CM | POA: Diagnosis not present

## 2022-10-21 MED ORDER — LOSARTAN POTASSIUM 50 MG PO TABS: 50.0000 mg | ORAL_TABLET | Freq: Every day | ORAL | 1 refills | Status: DC

## 2022-10-21 MED ORDER — ATORVASTATIN CALCIUM 20 MG PO TABS: 20.0000 mg | ORAL_TABLET | ORAL | 1 refills | Status: DC

## 2022-10-21 MED ORDER — HYDROCHLOROTHIAZIDE 12.5 MG PO TABS: 12.5000 mg | ORAL_TABLET | Freq: Every day | ORAL | 1 refills | Status: DC

## 2022-10-21 MED ORDER — NAPROXEN 375 MG PO TABS: 375.0000 mg | ORAL_TABLET | Freq: Two times a day (BID) | ORAL | 0 refills | Status: DC

## 2022-10-21 MED ORDER — OMEPRAZOLE 40 MG PO CPDR: 40.0000 mg | DELAYED_RELEASE_CAPSULE | Freq: Every day | ORAL | 1 refills | Status: DC

## 2022-10-21 MED ORDER — PREMARIN 0.625 MG/GM VA CREA: TOPICAL_CREAM | VAGINAL | 5 refills | Status: AC

## 2022-10-21 NOTE — Patient Instructions (Signed)
Health Maintenance for Postmenopausal Women Menopause is a normal process in which your ability to get pregnant comes to an end. This process happens slowly over many months or years, usually between the ages of 48 and 55. Menopause is complete when you have missed your menstrual period for 12 months. It is important to talk with your health care provider about some of the most common conditions that affect women after menopause (postmenopausal women). These include heart disease, cancer, and bone loss (osteoporosis). Adopting a healthy lifestyle and getting preventive care can help to promote your health and wellness. The actions you take can also lower your chances of developing some of these common conditions. What are the signs and symptoms of menopause? During menopause, you may have the following symptoms: Hot flashes. These can be moderate or severe. Night sweats. Decrease in sex drive. Mood swings. Headaches. Tiredness (fatigue). Irritability. Memory problems. Problems falling asleep or staying asleep. Talk with your health care provider about treatment options for your symptoms. Do I need hormone replacement therapy? Hormone replacement therapy is effective in treating symptoms that are caused by menopause, such as hot flashes and night sweats. Hormone replacement carries certain risks, especially as you become older. If you are thinking about using estrogen or estrogen with progestin, discuss the benefits and risks with your health care provider. How can I reduce my risk for heart disease and stroke? The risk of heart disease, heart attack, and stroke increases as you age. One of the causes may be a change in the body's hormones during menopause. This can affect how your body uses dietary fats, triglycerides, and cholesterol. Heart attack and stroke are medical emergencies. There are many things that you can do to help prevent heart disease and stroke. Watch your blood pressure High  blood pressure causes heart disease and increases the risk of stroke. This is more likely to develop in people who have high blood pressure readings or are overweight. Have your blood pressure checked: Every 3-5 years if you are 18-39 years of age. Every year if you are 40 years old or older. Eat a healthy diet  Eat a diet that includes plenty of vegetables, fruits, low-fat dairy products, and lean protein. Do not eat a lot of foods that are high in solid fats, added sugars, or sodium. Get regular exercise Get regular exercise. This is one of the most important things you can do for your health. Most adults should: Try to exercise for at least 150 minutes each week. The exercise should increase your heart rate and make you sweat (moderate-intensity exercise). Try to do strengthening exercises at least twice each week. Do these in addition to the moderate-intensity exercise. Spend less time sitting. Even light physical activity can be beneficial. Other tips Work with your health care provider to achieve or maintain a healthy weight. Do not use any products that contain nicotine or tobacco. These products include cigarettes, chewing tobacco, and vaping devices, such as e-cigarettes. If you need help quitting, ask your health care provider. Know your numbers. Ask your health care provider to check your cholesterol and your blood sugar (glucose). Continue to have your blood tested as directed by your health care provider. Do I need screening for cancer? Depending on your health history and family history, you may need to have cancer screenings at different stages of your life. This may include screening for: Breast cancer. Cervical cancer. Lung cancer. Colorectal cancer. What is my risk for osteoporosis? After menopause, you may be   at increased risk for osteoporosis. Osteoporosis is a condition in which bone destruction happens more quickly than new bone creation. To help prevent osteoporosis or  the bone fractures that can happen because of osteoporosis, you may take the following actions: If you are 19-50 years old, get at least 1,000 mg of calcium and at least 600 international units (IU) of vitamin D per day. If you are older than age 50 but younger than age 70, get at least 1,200 mg of calcium and at least 600 international units (IU) of vitamin D per day. If you are older than age 70, get at least 1,200 mg of calcium and at least 800 international units (IU) of vitamin D per day. Smoking and drinking excessive alcohol increase the risk of osteoporosis. Eat foods that are rich in calcium and vitamin D, and do weight-bearing exercises several times each week as directed by your health care provider. How does menopause affect my mental health? Depression may occur at any age, but it is more common as you become older. Common symptoms of depression include: Feeling depressed. Changes in sleep patterns. Changes in appetite or eating patterns. Feeling an overall lack of motivation or enjoyment of activities that you previously enjoyed. Frequent crying spells. Talk with your health care provider if you think that you are experiencing any of these symptoms. General instructions See your health care provider for regular wellness exams and vaccines. This may include: Scheduling regular health, dental, and eye exams. Getting and maintaining your vaccines. These include: Influenza vaccine. Get this vaccine each year before the flu season begins. Pneumonia vaccine. Shingles vaccine. Tetanus, diphtheria, and pertussis (Tdap) booster vaccine. Your health care provider may also recommend other immunizations. Tell your health care provider if you have ever been abused or do not feel safe at home. Summary Menopause is a normal process in which your ability to get pregnant comes to an end. This condition causes hot flashes, night sweats, decreased interest in sex, mood swings, headaches, or lack  of sleep. Treatment for this condition may include hormone replacement therapy. Take actions to keep yourself healthy, including exercising regularly, eating a healthy diet, watching your weight, and checking your blood pressure and blood sugar levels. Get screened for cancer and depression. Make sure that you are up to date with all your vaccines. This information is not intended to replace advice given to you by your health care provider. Make sure you discuss any questions you have with your health care provider. Document Revised: 09/21/2020 Document Reviewed: 09/21/2020 Elsevier Patient Education  2024 [employer].  

## 2022-10-21 NOTE — Addendum Note (Signed)
Addended by: Kavin Leech E on: 10/21/2022 11:53 AM   Modules accepted: Orders

## 2022-10-21 NOTE — Assessment & Plan Note (Signed)
Encourage diet and exercise for weight loss 

## 2022-10-21 NOTE — Progress Notes (Signed)
Subjective:    Patient ID: Tracey Carroll, female    DOB: 24-Jun-1940, 82 y.o.   MRN: [id number]  HPI  Patient presents to clinic today for her annual exam.  Flu: 05/2021 Tetanus: 07/2013 COVID: Moderna x 2 Pneumovax: 12/2014 Prevnar: 12/2013 Shingrix: Never Pap smear: No longer screening Mammogram: 12/2016 Bone density: 06/2021 Colon screening: No longer screening Vision screening: annually Dentist: annually  Diet: She does eat some meat. She consumes more veggies than fruits. She tries to avoid fried foods. She drinks mostly water, pineapple juice. Exercise: None   Review of Systems     Past Medical History:  Diagnosis Date   Acid reflux 10/19/2014   Arthritis    arms   Back pain, chronic 10/19/2014   GERD (gastroesophageal reflux disease)    Hyperlipidemia    Hypertension    Insomnia, persistent 10/19/2014    Current Outpatient Medications  Medication Sig Dispense Refill   atorvastatin (LIPITOR) 20 MG tablet TAKE 1 TABLET BY MOUTH EVERY  OTHER DAY 45 tablet 1   conjugated estrogens (PREMARIN) vaginal cream Apply 0.5mg  (pea-sized amount)  just inside the vaginal introitus with a finger-tip on  Monday, Wednesday and Friday nights. 30 g 12   hydrochlorothiazide (HYDRODIURIL) 12.5 MG tablet TAKE 1 TABLET BY MOUTH DAILY 30 tablet 0   losartan (COZAAR) 50 MG tablet TAKE 1 TABLET BY MOUTH DAILY 30 tablet 0   omeprazole (PRILOSEC) 40 MG capsule TAKE 1 CAPSULE BY MOUTH DAILY 100 capsule 0   No current facility-administered medications for this visit.    No Known Allergies  Family History  Problem Relation Age of Onset   Diabetes Father        in her 63 and 37s   Hypertension Daughter        her 50s   Kidney cancer Neg Hx    Bladder Cancer Neg Hx     Social History   Socioeconomic History   Marital status: Single    Spouse name: Not on file   Number of children: Not on file   Years of education: Not on file   Highest education level: 12th grade   Occupational History   Occupation: retired  Tobacco Use   Smoking status: Never   Smokeless tobacco: Never  Vaping Use   Vaping Use: Never used  Substance and Sexual Activity   Alcohol use: No    Alcohol/week: 0.0 standard drinks of alcohol   Drug use: No   Sexual activity: Not Currently  Other Topics Concern   Not on file  Social History Narrative   ** Merged History Encounter **       Social Determinants of Health   Financial Resource Strain: Low Risk  (11/05/2021)   Overall Financial Resource Strain (CARDIA)    Difficulty of Paying Living Expenses: Not hard at all  Food Insecurity: No Food Insecurity (11/05/2021)   Hunger Vital Sign    Worried About Running Out of Food in the Last Year: Never true    Ran Out of Food in the Last Year: Never true  Transportation Needs: No Transportation Needs (11/05/2021)   PRAPARE - Administrator, Civil Service (Medical): No    Lack of Transportation (Non-Medical): No  Physical Activity: Inactive (11/05/2021)   Exercise Vital Sign    Days of Exercise per Week: 0 days    Minutes of Exercise per Session: 0 min  Stress: No Stress Concern Present (11/05/2021)   [employer] of Occupational Health -  Occupational Stress Questionnaire    Feeling of Stress : Not at all  Social Connections: Moderately Isolated (11/05/2021)   Social Connection and Isolation Panel [NHANES]    Frequency of Communication with Friends and Family: More than three times a week    Frequency of Social Gatherings with Friends and Family: Never    Attends Religious Services: More than 4 times per year    Active Member of [employer] or Organizations: No    Attends Banker Meetings: Never    Marital Status: Widowed  Intimate Partner Violence: Not At Risk (11/05/2021)   Humiliation, Afraid, Rape, and Kick questionnaire    Fear of Current or Ex-Partner: No    Emotionally Abused: No    Physically Abused: No    Sexually Abused: No      Constitutional: Denies fever, malaise, fatigue, headache or abrupt weight changes.  HEENT: Denies eye pain, eye redness, ear pain, ringing in the ears, wax buildup, runny nose, nasal congestion, bloody nose, or sore throat. Respiratory: Denies difficulty breathing, shortness of breath, cough or sputum production.   Cardiovascular: Denies chest pain, chest tightness, palpitations or swelling in the hands or feet.  Gastrointestinal: Patient reports intermittent reflux.  Denies abdominal pain, bloating, constipation, diarrhea or blood in the stool.  GU: Denies urgency, frequency, pain with urination, burning sensation, blood in urine, odor or discharge. Musculoskeletal: Patient reports joint pain, acute right hip pain.  Denies decrease in range of motion, difficulty with gait, muscle pain or joint swelling.  Skin: Denies redness, rashes, lesions or ulcercations.  Neurological: Patient reports insomnia.  Denies dizziness, difficulty with memory, difficulty with speech or problems with balance and coordination.  Psych: Denies anxiety, depression, SI/HI.  No other specific complaints in a complete review of systems (except as listed in HPI above).  Objective:   Physical Exam  BP 118/68 (BP Location: Left Arm, Patient Position: Sitting, Cuff Size: Normal)   Pulse 60   Temp (!) 96.6 F (35.9 C) (Temporal)   Ht 4\' 11"  (1.499 m)   Wt 177 lb (80.3 kg)   BMI 35.75 kg/m   Wt Readings from Last 3 Encounters:  06/02/22 184 lb 11.2 oz (83.8 kg)  12/06/21 190 lb (86.2 kg)  11/05/21 189 lb 9.6 oz (86 kg)    General: Appears her stated age, obese in NAD. Skin: Warm, dry and intact.  HEENT: Head: normal shape and size; Eyes: sclera white, no icterus, conjunctiva pink, PERRLA and EOMs intact;  Neck:  Neck supple, trachea midline. No masses, lumps or thyromegaly present.  Cardiovascular: Normal rate and rhythm. S1,S2 noted.  No murmur, rubs or gallops noted. No JVD or BLE edema. No carotid  bruits noted. Pulmonary/Chest: Normal effort and positive vesicular breath sounds. No respiratory distress. No wheezes, rales or ronchi noted.  Abdomen: Soft and nontender. Normal bowel sounds.  Musculoskeletal: She has difficulty getting from a sitting to a standing position.  Strength 5/5 BUE/BLE.  No difficulty with gait.  Neurological: Alert and oriented. Cranial nerves II-XII grossly intact. Coordination normal.  Psychiatric: Mood and affect normal. Behavior is normal. Judgment and thought content normal.    BMET    Component Value Date/Time   NA 140 12/06/2021 0836   K 3.8 12/06/2021 0836   CL 102 12/06/2021 0836   CO2 26 12/06/2021 0836   GLUCOSE 93 12/06/2021 0836   BUN 12 12/06/2021 0836   BUN 14 01/04/2017 1622   CREATININE 0.71 12/06/2021 0836   CALCIUM 9.4 12/06/2021 0836  GFRNONAA 81 08/30/2019 0911   GFRAA 94 08/30/2019 0911    Lipid Panel     Component Value Date/Time   CHOL 185 12/06/2021 0836   TRIG 75 12/06/2021 0836   HDL 74 12/06/2021 0836   CHOLHDL 2.5 12/06/2021 0836   LDLCALC 95 12/06/2021 0836    CBC    Component Value Date/Time   WBC 3.7 (L) 07/09/2021 0909   RBC 3.69 (L) 07/09/2021 0909   HGB 11.7 07/09/2021 0909   HCT 35.0 07/09/2021 0909   PLT 221 07/09/2021 0909   MCV 94.9 07/09/2021 0909   MCH 31.7 07/09/2021 0909   MCHC 33.4 07/09/2021 0909   RDW 12.2 07/09/2021 0909   LYMPHSABS 1,613 07/09/2021 0909   EOSABS 30 07/09/2021 0909   BASOSABS 19 07/09/2021 0909    Hgb A1C Lab Results  Component Value Date   HGBA1C 5.0 12/06/2021           Assessment & Plan:   Preventative Health Maintenance:  Encouraged her to get a flu shot in the fall Tetanus UTD Encouraged her to get her COVID booster Pneumovax and Prevnar 13 UTD, Prevnar 20 today Discussed Shingrix vaccine, she will check coverage with her insurance company and schedule visit she would like to have this done She no longer wants to screen for cervical cancer She no  longer wants to screen for breast cancer Bone density UTD She no longer wants to screen for colon cancer Encouraged her to consume a balanced diet and exercise regimen Advised her to see an eye doctor and dentist annually Will check CBC, c-Met, lipid, A1c today  Acute Right Hip Pain:  Rx for naproxen 375 mg twice daily x 5 days Ice may be helpful  RTC in 6 months, follow-up chronic conditions Nicki Reaper, NP

## 2022-10-22 LAB — COMPLETE METABOLIC PANEL WITH GFR
AG Ratio: 1.5 (calc) (ref 1.0–2.5)
ALT: 15 U/L (ref 6–29)
AST: 22 U/L (ref 10–35)
Albumin: 4.3 g/dL (ref 3.6–5.1)
Alkaline phosphatase (APISO): 61 U/L (ref 37–153)
BUN: 12 mg/dL (ref 7–25)
CO2: 29 mmol/L (ref 20–32)
Calcium: 9.6 mg/dL (ref 8.6–10.4)
Chloride: 100 mmol/L (ref 98–110)
Creat: 0.74 mg/dL (ref 0.60–1.00)
Globulin: 2.9 g/dL (calc) (ref 1.9–3.7)
Glucose, Bld: 84 mg/dL (ref 65–139)
Potassium: 4.4 mmol/L (ref 3.5–5.3)
Sodium: 137 mmol/L (ref 135–146)
Total Bilirubin: 0.5 mg/dL (ref 0.2–1.2)
Total Protein: 7.2 g/dL (ref 6.1–8.1)
eGFR: 82 mL/min/{1.73_m2} (ref >=60)

## 2022-10-22 LAB — CBC
HCT: 34 % — ABNORMAL LOW (ref 35.0–45.0)
Hemoglobin: 11.2 g/dL — ABNORMAL LOW (ref 11.7–15.5)
MCH: 31.1 pg (ref 27.0–33.0)
MCHC: 32.9 g/dL (ref 32.0–36.0)
MCV: 94.4 fL (ref 80.0–100.0)
MPV: 10.5 fL (ref 7.5–12.5)
Platelets: 206 10*3/uL (ref 140–400)
RBC: 3.6 10*6/uL — ABNORMAL LOW (ref 3.80–5.10)
RDW: 11.9 % (ref 11.0–15.0)
WBC: 3.6 10*3/uL — ABNORMAL LOW (ref 3.8–10.8)

## 2022-10-22 LAB — LIPID PANEL
Cholesterol: 192 mg/dL (ref <200)
HDL: 73 mg/dL (ref >=50)
LDL Cholesterol (Calc): 100 mg/dL (calc) — ABNORMAL HIGH
Non-HDL Cholesterol (Calc): 119 mg/dL (calc) (ref <130)
Total CHOL/HDL Ratio: 2.6 (calc) (ref <5.0)
Triglycerides: 96 mg/dL (ref <150)

## 2022-10-22 LAB — HEMOGLOBIN A1C
Hgb A1c MFr Bld: 5.2 % of total Hgb (ref <5.7)
Mean Plasma Glucose: 103 mg/dL
eAG (mmol/L): 5.7 mmol/L

## 2022-10-24 ENCOUNTER — Other Ambulatory Visit: Payer: Self-pay

## 2022-10-24 MED ORDER — ATORVASTATIN CALCIUM 40 MG PO TABS: 40.0000 mg | ORAL_TABLET | Freq: Every day | ORAL | 1 refills | Status: DC

## 2022-12-16 ENCOUNTER — Ambulatory Visit: Payer: 59

## 2022-12-16 DIAGNOSIS — Z0000 Encounter for general adult medical examination without abnormal findings: Secondary | ICD-10-CM | POA: Diagnosis not present

## 2022-12-16 NOTE — Progress Notes (Signed)
Subjective:   Tracey Carroll is a 82 y.o. female who presents for Medicare Annual (Subsequent) preventive examination.  Visit Complete: Virtual  I connected with  Tracey Carroll on 12/16/22 by a audio enabled telemedicine application and verified that I am speaking with the correct person using two identifiers.  Patient Location: Home  Provider Location: Office/Clinic  I discussed the limitations of evaluation and management by telemedicine. The patient expressed understanding and agreed to proceed.  Vital Signs: Patient was unable to self-report vital signs via telehealth due to a lack of equipment at home.  Review of Systems     Cardiac Risk Factors include: advanced age (>77men, >102 women);hypertension;sedentary lifestyle;obesity (BMI >30kg/m2)     Objective:    There were no vitals filed for this visit. There is no height or weight on file to calculate BMI.     12/16/2022   11:01 AM 11/05/2021    8:50 AM 10/27/2020    8:20 AM 10/22/2019    8:24 AM 09/17/2019    9:10 AM 03/06/2018   11:04 AM 01/17/2017    2:22 PM  Advanced Directives  Does Patient Have a Medical Advance Directive? No No No No No No No  Does patient want to make changes to medical advance directive?       Yes (MAU/Ambulatory/Procedural Areas - Information given)  Would patient like information on creating a medical advance directive? No - Patient declined No - Patient declined  No - Patient declined  Yes (MAU/Ambulatory/Procedural Areas - Information given)     Current Medications (verified) Outpatient Encounter Medications as of 12/16/2022  Medication Sig   aspirin EC 81 MG tablet Take 81 mg by mouth daily. Swallow whole.   atorvastatin (LIPITOR) 40 MG tablet Take 1 tablet (40 mg total) by mouth daily.   conjugated estrogens (PREMARIN) vaginal cream Apply 0.5mg  (pea-sized amount)  just inside the vaginal introitus with a finger-tip on  Monday, Wednesday and Friday nights.   hydrochlorothiazide (HYDRODIURIL)  12.5 MG tablet Take 1 tablet (12.5 mg total) by mouth daily.   losartan (COZAAR) 50 MG tablet Take 1 tablet (50 mg total) by mouth daily.   naproxen (NAPROSYN) 375 MG tablet Take 1 tablet (375 mg total) by mouth 2 (two) times daily with a meal.   omeprazole (PRILOSEC) 40 MG capsule Take 1 capsule (40 mg total) by mouth daily.   No facility-administered encounter medications on file as of 12/16/2022.    Allergies (verified) Patient has no known allergies.   History: Past Medical History:  Diagnosis Date   Acid reflux 10/19/2014   Arthritis    arms   Back pain, chronic 10/19/2014   GERD (gastroesophageal reflux disease)    Hyperlipidemia    Hypertension    Insomnia, persistent 10/19/2014   Past Surgical History:  Procedure Laterality Date   ESOPHAGOGASTRODUODENOSCOPY (EGD) WITH PROPOFOL N/A 11/20/2014   Procedure: ESOPHAGOGASTRODUODENOSCOPY (EGD) WITH PROPOFOL with dilation;  Surgeon: Midge Minium, MD;  Location: [facility];  Service: Endoscopy;  Laterality: N/A;   ESOPHAGOGASTRODUODENOSCOPY (EGD) WITH PROPOFOL N/A 09/17/2019   Procedure: ESOPHAGOGASTRODUODENOSCOPY (EGD) WITH PROPOFOL;  Surgeon: Pasty Spillers, MD;  Location: [facility];  Service: Endoscopy;  Laterality: N/A;   TUBAL LIGATION     Family History  Problem Relation Age of Onset   Diabetes Father        in her 60 and 27s   Hypertension Daughter        her 55s   Kidney cancer Neg Hx  Bladder Cancer Neg Hx    Social History   Socioeconomic History   Marital status: Single    Spouse name: Not on file   Number of children: Not on file   Years of education: Not on file   Highest education level: 12th grade  Occupational History   Occupation: retired  Tobacco Use   Smoking status: Never   Smokeless tobacco: Never  Vaping Use   Vaping status: Never Used  Substance and Sexual Activity   Alcohol use: No    Alcohol/week: 0.0 standard drinks of alcohol   Drug use: No   Sexual activity: Not  Currently  Other Topics Concern   Not on file  Social History Narrative   ** Merged History Encounter **       Social Determinants of Health   Financial Resource Strain: Low Risk  (12/16/2022)   Overall Financial Resource Strain (CARDIA)    Difficulty of Paying Living Expenses: Not hard at all  Food Insecurity: No Food Insecurity (12/16/2022)   Hunger Vital Sign    Worried About Running Out of Food in the Last Year: Never true    Ran Out of Food in the Last Year: Never true  Transportation Needs: No Transportation Needs (12/16/2022)   PRAPARE - Administrator, Civil Service (Medical): No    Lack of Transportation (Non-Medical): No  Physical Activity: Inactive (12/16/2022)   Exercise Vital Sign    Days of Exercise per Week: 0 days    Minutes of Exercise per Session: 0 min  Stress: No Stress Concern Present (12/16/2022)   [employer] of Occupational Health - Occupational Stress Questionnaire    Feeling of Stress : Not at all  Social Connections: Socially Isolated (12/16/2022)   Social Connection and Isolation Panel [NHANES]    Frequency of Communication with Friends and Family: Once a week    Frequency of Social Gatherings with Friends and Family: Once a week    Attends Religious Services: More than 4 times per year    Active Member of [employer] or Organizations: No    Attends Banker Meetings: Never    Marital Status: Widowed    Tobacco Counseling Counseling given: Not Answered   Clinical Intake:  Pre-visit preparation completed: Yes  Pain : No/denies pain     Nutritional Risks: None Diabetes: No  How often do you need to have someone help you when you read instructions, pamphlets, or other written materials from your doctor or pharmacy?: 1 - Never  Interpreter Needed?: No  Information entered by :: Kennedy Bucker, LPN   Activities of Daily Living    12/16/2022   11:01 AM 10/21/2022   10:17 AM  In your present state of health, do you have any  difficulty performing the following activities:  Hearing? 0 0  Vision? 0 0  Difficulty concentrating or making decisions? 0 0  Walking or climbing stairs? 0 1  Dressing or bathing? 0 0  Doing errands, shopping? 0 0  Preparing Food and eating ? N   Using the Toilet? N   In the past six months, have you accidently leaked urine? N   Do you have problems with loss of bowel control? N   Managing your Medications? N   Managing your Finances? N   Housekeeping or managing your Housekeeping? N     Patient Care Team: Lorre Munroe, NP as PCP - General (Internal Medicine)  Indicate any recent Medical Services you may have received from  other than [facility] providers in the past year (date may be approximate).     Assessment:   This is a routine wellness examination for [place].  Hearing/Vision screen Hearing Screening - Comments:: No aids Vision Screening - Comments:: Wears glasses- [facility]. Dellie Burns  Dietary issues and exercise activities discussed:     Goals Addressed             This Visit's Progress    DIET - INCREASE WATER INTAKE         Depression Screen    12/16/2022   10:59 AM 10/21/2022   10:17 AM 12/06/2021    9:49 AM 11/05/2021    8:49 AM 10/27/2020    8:21 AM 10/22/2019    8:27 AM 04/17/2019    2:41 PM  PHQ 2/9 Scores  PHQ - 2 Score 0 0 4 0 0 0 0  PHQ- 9 Score 0  4 0       Fall Risk    12/16/2022   11:01 AM 10/21/2022   10:17 AM 11/05/2021    8:51 AM 10/27/2020    8:21 AM 10/22/2019    8:26 AM  Fall Risk   Falls in the past year? 0 0 0 0 1  Number falls in past yr: 0  0  0  Injury with Fall? 0 0 0  0  Risk for fall due to : No Fall Risks No Fall Risks No Fall Risks Medication side effect   Follow up Falls prevention discussed;Falls evaluation completed  Falls evaluation completed Falls evaluation completed;Education provided;Falls prevention discussed Education provided;Falls evaluation completed    MEDICARE RISK AT HOME:  Medicare Risk at Home -  12/16/22 1102     Any stairs in or around the home? No    If so, are there any without handrails? No    Home free of loose throw rugs in walkways, pet beds, electrical cords, etc? Yes    Adequate lighting in your home to reduce risk of falls? Yes    Life alert? No    Use of a cane, walker or w/c? No    Grab bars in the bathroom? No    Shower chair or bench in shower? No    Elevated toilet seat or a handicapped toilet? No             TIMED UP AND GO:  Was the test performed?  No    Cognitive Function:        12/16/2022   11:02 AM 10/27/2020    8:22 AM 10/22/2019    8:28 AM 03/06/2018   11:05 AM 01/17/2017    2:24 PM  6CIT Screen  What Year? 0 points 0 points 0 points 0 points 0 points  What month? 0 points 0 points 0 points 0 points 0 points  What time? 0 points 0 points 0 points 0 points 0 points  Count back from 20 0 points 0 points 0 points 0 points 0 points  Months in reverse 4 points 0 points 4 points 0 points 0 points  Repeat phrase 0 points 8 points 2 points 0 points 2 points  Total Score 4 points 8 points 6 points 0 points 2 points    Immunizations Immunization History  Administered Date(s) Administered   Fluad Quad(high Dose 65+) 06/07/2021   Influenza, High Dose Seasonal PF 01/22/2015, 03/21/2016, 01/17/2017, 03/06/2018, 02/12/2019   Influenza-Unspecified 04/14/2014, 04/14/2014   Moderna Sars-Covid-2 Vaccination 12/30/2019, 01/28/2020   PNEUMOCOCCAL CONJUGATE-20 10/21/2022  Pneumococcal Conjugate-13 12/18/2013   Pneumococcal Polysaccharide-23 04/16/2012, 12/19/2014   Tdap 08/08/2013, 08/08/2013    TDAP status: Up to date  Flu Vaccine status: Up to date  Pneumococcal vaccine status: Up to date  Covid-19 vaccine status: Completed vaccines  Qualifies for Shingles Vaccine? Yes   Zostavax completed No   Shingrix Completed?: No.    Education has been provided regarding the importance of this vaccine. Patient has been advised to call insurance company to  determine out of pocket expense if they have not yet received this vaccine. Advised may also receive vaccine at local pharmacy or Health Dept. Verbalized acceptance and understanding.  Screening Tests Health Maintenance  Topic Date Due   Zoster Vaccines- Shingrix (1 of 2) Never done   INFLUENZA VACCINE  12/15/2022   DTaP/Tdap/Td (3 - Td or Tdap) 08/09/2023   Medicare Annual Wellness (AWV)  12/16/2023   Pneumonia Vaccine 22+ Years old  Completed   DEXA SCAN  Completed   Hepatitis C Screening  Completed   HPV VACCINES  Aged Out   Colonoscopy  Discontinued   COVID-19 Vaccine  Discontinued    Health Maintenance  Health Maintenance Due  Topic Date Due   Zoster Vaccines- Shingrix (1 of 2) Never done   INFLUENZA VACCINE  12/15/2022    Colorectal cancer screening: No longer required.   Mammogram status: No longer required due to age.  Bone Density status: Completed 06/29/21. Results reflect: Bone density results: OSTEOPENIA. Repeat every 5 years.  Lung Cancer Screening: (Low Dose CT Chest recommended if Age 64-80 years, 20 pack-year currently smoking OR have quit w/in 15years.) does not qualify.   Additional Screening:  Hepatitis C Screening: does qualify; Completed 07/07/20  Vision Screening: Recommended annual ophthalmology exams for early detection of glaucoma and other disorders of the eye. Is the patient up to date with their annual eye exam?  Yes  Who is the provider or what is the name of the office in which the patient attends annual eye exams? Dr.Dingeldein If pt is not established with a provider, would they like to be referred to a provider to establish care? No .   Dental Screening: Recommended annual dental exams for proper oral hygiene   Community Resource Referral / Chronic Care Management: CRR required this visit?  No   CCM required this visit?  No     Plan:     I have personally reviewed and noted the following in the patient's chart:   Medical and  social history Use of alcohol, tobacco or illicit drugs  Current medications and supplements including opioid prescriptions. Patient is not currently taking opioid prescriptions. Functional ability and status Nutritional status Physical activity Advanced directives List of other physicians Hospitalizations, surgeries, and ER visits in previous 12 months Vitals Screenings to include cognitive, depression, and falls Referrals and appointments  In addition, I have reviewed and discussed with patient certain preventive protocols, quality metrics, and best practice recommendations. A written personalized care plan for preventive services as well as general preventive health recommendations were provided to patient.     Hal Hope, LPN   05/21/1094   After Visit Summary: (MyChart) Due to this being a telephonic visit, the after visit summary with patients personalized plan was offered to patient via MyChart   Nurse Notes: none

## 2022-12-16 NOTE — Patient Instructions (Addendum)
Tracey Carroll , Thank you for taking time to come for your Medicare Wellness Visit. I appreciate your ongoing commitment to your health goals. Please review the following plan we discussed and let me know if I can assist you in the future.   Referrals/Orders/Follow-Ups/Clinician Recommendations: none  This is a list of the screening recommended for you and due dates:  Health Maintenance  Topic Date Due   Zoster (Shingles) Vaccine (1 of 2) Never done   Flu Shot  12/15/2022   DTaP/Tdap/Td vaccine (3 - Td or Tdap) 08/09/2023   Medicare Annual Wellness Visit  12/16/2023   Pneumonia Vaccine  Completed   DEXA scan (bone density measurement)  Completed   Hepatitis C Screening  Completed   HPV Vaccine  Aged Out   Colon Cancer Screening  Discontinued   COVID-19 Vaccine  Discontinued    Advanced directives: (Declined) Advance directive discussed with you today. Even though you declined this today, please call our office should you change your mind, and we can give you the proper paperwork for you to fill out.  Next Medicare Annual Wellness Visit scheduled for next year: Yes   12/21/23 @ 9:45 am by phone  Preventive Care 65 Years and Older, Female Preventive care refers to lifestyle choices and visits with your health care provider that can promote health and wellness. What does preventive care include? A yearly physical exam. This is also called an annual well check. Dental exams once or twice a year. Routine eye exams. Ask your health care provider how often you should have your eyes checked. Personal lifestyle choices, including: Daily care of your teeth and gums. Regular physical activity. Eating a healthy diet. Avoiding tobacco and drug use. Limiting alcohol use. Practicing safe sex. Taking low-dose aspirin every day. Taking vitamin and mineral supplements as recommended by your health care provider. What happens during an annual well check? The services and screenings done by your health  care provider during your annual well check will depend on your age, overall health, lifestyle risk factors, and family history of disease. Counseling  Your health care provider may ask you questions about your: Alcohol use. Tobacco use. Drug use. Emotional well-being. Home and relationship well-being. Sexual activity. Eating habits. History of falls. Memory and ability to understand (cognition). Work and work Astronomer. Reproductive health. Screening  You may have the following tests or measurements: Height, weight, and BMI. Blood pressure. Lipid and cholesterol levels. These may be checked every 5 years, or more frequently if you are over 63 years old. Skin check. Lung cancer screening. You may have this screening every year starting at age 43 if you have a 30-pack-year history of smoking and currently smoke or have quit within the past 15 years. Fecal occult blood test (FOBT) of the stool. You may have this test every year starting at age 41. Flexible sigmoidoscopy or colonoscopy. You may have a sigmoidoscopy every 5 years or a colonoscopy every 10 years starting at age 67. Hepatitis C blood test. Hepatitis B blood test. Sexually transmitted disease (STD) testing. Diabetes screening. This is done by checking your blood sugar (glucose) after you have not eaten for a while (fasting). You may have this done every 1-3 years. Bone density scan. This is done to screen for osteoporosis. You may have this done starting at age 45. Mammogram. This may be done every 1-2 years. Talk to your health care provider about how often you should have regular mammograms. Talk with your health care provider about  your test results, treatment options, and if necessary, the need for more tests. Vaccines  Your health care provider may recommend certain vaccines, such as: Influenza vaccine. This is recommended every year. Tetanus, diphtheria, and acellular pertussis (Tdap, Td) vaccine. You may need a Td  booster every 10 years. Zoster vaccine. You may need this after age 27. Pneumococcal 13-valent conjugate (PCV13) vaccine. One dose is recommended after age 76. Pneumococcal polysaccharide (PPSV23) vaccine. One dose is recommended after age 63. Talk to your health care provider about which screenings and vaccines you need and how often you need them. This information is not intended to replace advice given to you by your health care provider. Make sure you discuss any questions you have with your health care provider. Document Released: 05/29/2015 Document Revised: 01/20/2016 Document Reviewed: 03/03/2015 Elsevier Interactive Patient Education  2017 [employer].  Fall Prevention in the Home Falls can cause injuries. They can happen to people of all ages. There are many things you can do to make your home safe and to help prevent falls. What can I do on the outside of my home? Regularly fix the edges of walkways and driveways and fix any cracks. Remove anything that might make you trip as you walk through a door, such as a raised step or threshold. Trim any bushes or trees on the path to your home. Use bright outdoor lighting. Clear any walking paths of anything that might make someone trip, such as rocks or tools. Regularly check to see if handrails are loose or broken. Make sure that both sides of any steps have handrails. Any raised decks and porches should have guardrails on the edges. Have any leaves, snow, or ice cleared regularly. Use sand or salt on walking paths during winter. Clean up any spills in your garage right away. This includes oil or grease spills. What can I do in the bathroom? Use night lights. Install grab bars by the toilet and in the tub and shower. Do not use towel bars as grab bars. Use non-skid mats or decals in the tub or shower. If you need to sit down in the shower, use a plastic, non-slip stool. Keep the floor dry. Clean up any water that spills on the floor  as soon as it happens. Remove soap buildup in the tub or shower regularly. Attach bath mats securely with double-sided non-slip rug tape. Do not have throw rugs and other things on the floor that can make you trip. What can I do in the bedroom? Use night lights. Make sure that you have a light by your bed that is easy to reach. Do not use any sheets or blankets that are too big for your bed. They should not hang down onto the floor. Have a firm chair that has side arms. You can use this for support while you get dressed. Do not have throw rugs and other things on the floor that can make you trip. What can I do in the kitchen? Clean up any spills right away. Avoid walking on wet floors. Keep items that you use a lot in easy-to-reach places. If you need to reach something above you, use a strong step stool that has a grab bar. Keep electrical cords out of the way. Do not use floor polish or wax that makes floors slippery. If you must use wax, use non-skid floor wax. Do not have throw rugs and other things on the floor that can make you trip. What can I do with  my stairs? Do not leave any items on the stairs. Make sure that there are handrails on both sides of the stairs and use them. Fix handrails that are broken or loose. Make sure that handrails are as long as the stairways. Check any carpeting to make sure that it is firmly attached to the stairs. Fix any carpet that is loose or worn. Avoid having throw rugs at the top or bottom of the stairs. If you do have throw rugs, attach them to the floor with carpet tape. Make sure that you have a light switch at the top of the stairs and the bottom of the stairs. If you do not have them, ask someone to add them for you. What else can I do to help prevent falls? Wear shoes that: Do not have high heels. Have rubber bottoms. Are comfortable and fit you well. Are closed at the toe. Do not wear sandals. If you use a stepladder: Make sure that it is  fully opened. Do not climb a closed stepladder. Make sure that both sides of the stepladder are locked into place. Ask someone to hold it for you, if possible. Clearly mark and make sure that you can see: Any grab bars or handrails. First and last steps. Where the edge of each step is. Use tools that help you move around (mobility aids) if they are needed. These include: Canes. Walkers. Scooters. Crutches. Turn on the lights when you go into a dark area. Replace any light bulbs as soon as they burn out. Set up your furniture so you have a clear path. Avoid moving your furniture around. If any of your floors are uneven, fix them. If there are any pets around you, be aware of where they are. Review your medicines with your doctor. Some medicines can make you feel dizzy. This can increase your chance of falling. Ask your doctor what other things that you can do to help prevent falls. This information is not intended to replace advice given to you by your health care provider. Make sure you discuss any questions you have with your health care provider. Document Released: 02/26/2009 Document Revised: 10/08/2015 Document Reviewed: 06/06/2014 Elsevier Interactive Patient Education  2017 [employer].

## 2022-12-28 ENCOUNTER — Other Ambulatory Visit: Payer: Self-pay | Admitting: Internal Medicine

## 2022-12-29 ENCOUNTER — Other Ambulatory Visit: Payer: Self-pay | Admitting: Internal Medicine

## 2022-12-29 DIAGNOSIS — Z0001 Encounter for general adult medical examination with abnormal findings: Secondary | ICD-10-CM

## 2022-12-30 NOTE — Telephone Encounter (Signed)
Requested Prescriptions  Refused Prescriptions Disp Refills   losartan (COZAAR) 50 MG tablet [Pharmacy Med Name: Losartan Potassium 50 MG Oral Tablet] 100 tablet 2    Sig: TAKE 1 TABLET BY MOUTH DAILY     Cardiovascular:  Angiotensin Receptor Blockers Passed - 12/29/2022  5:06 AM      Passed - Cr in normal range and within 180 days    Creat  Date Value Ref Range Status  10/21/2022 0.74 0.60 - 1.00 mg/dL Final         Passed - K in normal range and within 180 days    Potassium  Date Value Ref Range Status  10/21/2022 4.4 3.5 - 5.3 mmol/L Final         Passed - Patient is not pregnant      Passed - Last BP in normal range    BP Readings from Last 1 Encounters:  10/21/22 118/68         Passed - Valid encounter within last 6 months    Recent Outpatient Visits           2 months ago Encounter for general adult medical examination with abnormal findings   [place] [facility] [place], Salvadore Oxford, NP   1 year ago Pure hypercholesterolemia   [place] [facility] [place], Salvadore Oxford, NP   1 year ago Need for immunization against influenza   [facility] [place], Salvadore Oxford, NP   2 years ago Aortic atherosclerosis [facility])   [place] [facility] [place], Salvadore Oxford, NP   2 years ago Need for hepatitis C screening test   [facility] [place] [facility] Gabriel Cirri, NP       Future Appointments             In 3 months Baity, Salvadore Oxford, NP [place] [facility], [facility]

## 2022-12-30 NOTE — Telephone Encounter (Signed)
Unable to refill per protocol, Rx request is too soon. Last refill 10/24/22 for 90 days.E-Prescribing Status: Receipt confirmed by pharmacy (10/24/2022 11:52 AM EDT).  Requested Prescriptions  Pending Prescriptions Disp Refills   atorvastatin (LIPITOR) 40 MG tablet [Pharmacy Med Name: Atorvastatin Calcium 40 MG Oral Tablet] 100 tablet 2    Sig: TAKE 1 TABLET BY MOUTH DAILY     Cardiovascular:  Antilipid - Statins Failed - 12/28/2022 10:15 PM      Failed - Lipid Panel in normal range within the last 12 months    Cholesterol  Date Value Ref Range Status  10/21/2022 192 <200 mg/dL Final   LDL Cholesterol (Calc)  Date Value Ref Range Status  10/21/2022 100 (H) mg/dL (calc) Final    Comment:    Reference range: <100 . Desirable range <100 mg/dL for primary prevention;   <70 mg/dL for patients with CHD or diabetic patients  with > or = 2 CHD risk factors. Marland Kitchen LDL-C is now calculated using the Martin-Hopkins  calculation, which is a validated novel method providing  better accuracy than the Friedewald equation in the  estimation of LDL-C.  Horald Pollen et al. Lenox Ahr. 1884;166(06): 2061-2068  (http://education.[employer].com/faq/FAQ164)    HDL  Date Value Ref Range Status  10/21/2022 73 > OR = 50 mg/dL Final   Triglycerides  Date Value Ref Range Status  10/21/2022 96 <150 mg/dL Final         Passed - Patient is not pregnant      Passed - Valid encounter within last 12 months    Recent Outpatient Visits           2 months ago Encounter for general adult medical examination with abnormal findings   [place] [facility] [place], Salvadore Oxford, NP   1 year ago Pure hypercholesterolemia   [place] [facility] [place], Salvadore Oxford, NP   1 year ago Need for immunization against influenza   [facility] [place], Salvadore Oxford, NP   2 years ago Aortic atherosclerosis [facility])   [place] [facility] [place], Salvadore Oxford, NP    2 years ago Need for hepatitis C screening test   [facility] Gabriel Cirri, NP       Future Appointments             In 3 months Baity, Salvadore Oxford, NP [place] [facility], [facility]

## 2023-03-05 ENCOUNTER — Other Ambulatory Visit: Payer: Self-pay | Admitting: Internal Medicine

## 2023-03-05 DIAGNOSIS — Z0001 Encounter for general adult medical examination with abnormal findings: Secondary | ICD-10-CM

## 2023-03-07 NOTE — Telephone Encounter (Signed)
Requested Prescriptions  Pending Prescriptions Disp Refills   omeprazole (PRILOSEC) 40 MG capsule [Pharmacy Med Name: Omeprazole 40 MG Oral Capsule Delayed Release] 100 capsule 1    Sig: TAKE 1 CAPSULE BY MOUTH DAILY     Gastroenterology: Proton Pump Inhibitors Passed - 03/05/2023 10:38 PM      Passed - Valid encounter within last 12 months    Recent Outpatient Visits           4 months ago Encounter for general adult medical examination with abnormal findings   [place] [facility] [place], Salvadore Oxford, NP   1 year ago Pure hypercholesterolemia   [facility] [place], Salvadore Oxford, NP   1 year ago Need for immunization against influenza   [facility] [place], Salvadore Oxford, NP   2 years ago Aortic atherosclerosis [facility])   [place] [facility] [place], Salvadore Oxford, NP   2 years ago Need for hepatitis C screening test   [facility] Gabriel Cirri, NP       Future Appointments             In 1 month Baity, Salvadore Oxford, NP [place] [facility], [facility]

## 2023-03-14 ENCOUNTER — Other Ambulatory Visit: Payer: Self-pay | Admitting: Internal Medicine

## 2023-03-15 NOTE — Telephone Encounter (Signed)
Labs in date  Requested Prescriptions  Pending Prescriptions Disp Refills   atorvastatin (LIPITOR) 40 MG tablet [Pharmacy Med Name: Atorvastatin Calcium 40 MG Oral Tablet] 90 tablet 3    Sig: TAKE 1 TABLET BY MOUTH DAILY     Cardiovascular:  Antilipid - Statins Failed - 03/14/2023 10:05 PM      Failed - Lipid Panel in normal range within the last 12 months    Cholesterol  Date Value Ref Range Status  10/21/2022 192 <200 mg/dL Final   LDL Cholesterol (Calc)  Date Value Ref Range Status  10/21/2022 100 (H) mg/dL (calc) Final    Comment:    Reference range: <100 . Desirable range <100 mg/dL for primary prevention;   <70 mg/dL for patients with CHD or diabetic patients  with > or = 2 CHD risk factors. Marland Kitchen LDL-C is now calculated using the Martin-Hopkins  calculation, which is a validated novel method providing  better accuracy than the Friedewald equation in the  estimation of LDL-C.  Horald Pollen et al. Lenox Ahr. 1610;960(45): 2061-2068  (http://education.[facility].com/faq/FAQ164)    HDL  Date Value Ref Range Status  10/21/2022 73 > OR = 50 mg/dL Final   Triglycerides  Date Value Ref Range Status  10/21/2022 96 <150 mg/dL Final         Passed - Patient is not pregnant      Passed - Valid encounter within last 12 months    Recent Outpatient Visits           4 months ago Encounter for general adult medical examination with abnormal findings   [place] [facility] [place], Salvadore Oxford, NP   1 year ago Pure hypercholesterolemia   [place] [facility] [place], Salvadore Oxford, NP   1 year ago Need for immunization against influenza   [place] [facility] [place], Salvadore Oxford, NP   2 years ago Aortic atherosclerosis [facility])   [place] [facility] [place], Salvadore Oxford, NP   2 years ago Need for hepatitis C screening test   [facility] Gabriel Cirri, NP       Future Appointments              In 1 month Baity, Salvadore Oxford, NP  [facility], [facility]

## 2023-04-24 ENCOUNTER — Encounter: Payer: Self-pay | Admitting: Internal Medicine

## 2023-04-24 ENCOUNTER — Ambulatory Visit (INDEPENDENT_AMBULATORY_CARE_PROVIDER_SITE_OTHER): Payer: 59 | Admitting: Internal Medicine

## 2023-04-24 VITALS — BP 118/70 | Ht 59.0 in | Wt 172.8 lb

## 2023-04-24 DIAGNOSIS — E7800 Pure hypercholesterolemia, unspecified: Secondary | ICD-10-CM | POA: Diagnosis not present

## 2023-04-24 DIAGNOSIS — E66811 Obesity, class 1: Secondary | ICD-10-CM

## 2023-04-24 DIAGNOSIS — M150 Primary generalized (osteo)arthritis: Secondary | ICD-10-CM

## 2023-04-24 DIAGNOSIS — D508 Other iron deficiency anemias: Secondary | ICD-10-CM

## 2023-04-24 DIAGNOSIS — E6609 Other obesity due to excess calories: Secondary | ICD-10-CM

## 2023-04-24 DIAGNOSIS — K219 Gastro-esophageal reflux disease without esophagitis: Secondary | ICD-10-CM | POA: Diagnosis not present

## 2023-04-24 DIAGNOSIS — I700 Atherosclerosis of aorta: Secondary | ICD-10-CM | POA: Diagnosis not present

## 2023-04-24 DIAGNOSIS — I10 Essential (primary) hypertension: Secondary | ICD-10-CM

## 2023-04-24 DIAGNOSIS — R739 Hyperglycemia, unspecified: Secondary | ICD-10-CM

## 2023-04-24 DIAGNOSIS — G4700 Insomnia, unspecified: Secondary | ICD-10-CM | POA: Diagnosis not present

## 2023-04-24 DIAGNOSIS — D709 Neutropenia, unspecified: Secondary | ICD-10-CM | POA: Diagnosis not present

## 2023-04-24 DIAGNOSIS — D649 Anemia, unspecified: Secondary | ICD-10-CM | POA: Insufficient documentation

## 2023-04-24 DIAGNOSIS — Z6834 Body mass index (BMI) 34.0-34.9, adult: Secondary | ICD-10-CM

## 2023-04-24 MED ORDER — OMEPRAZOLE 40 MG PO CPDR: 40.0000 mg | DELAYED_RELEASE_CAPSULE | Freq: Two times a day (BID) | ORAL | 1 refills | Status: DC

## 2023-04-24 NOTE — Assessment & Plan Note (Signed)
Continue losartan and HCTZ Reinforced DASH diet and exercise for weight loss C-Met today

## 2023-04-24 NOTE — Assessment & Plan Note (Signed)
Increase omeprazole to twice daily Encourage weight loss as this can help reduce reflux symptoms Advised her to try to identify and avoid foods that trigger her reflux

## 2023-04-24 NOTE — Assessment & Plan Note (Signed)
C-Met and lipid profile today Encouraged her to consume low-fat diet Continue atorvastatin 

## 2023-04-24 NOTE — Assessment & Plan Note (Addendum)
Encourage weight loss as this can help reduce joint pain Okay to take naproxen as needed for arthritic pain but avoid overuse given stomach issues

## 2023-04-24 NOTE — Assessment & Plan Note (Signed)
She is not interested in medication management at this time

## 2023-04-24 NOTE — Progress Notes (Signed)
Subjective:    Patient ID: Tracey Carroll, female    DOB: April 29, 1941, 82 y.o.   MRN: [id number]  HPI  Patient presents to clinic today for 2-month follow-up of chronic conditions.  HTN: Her BP today is 118/70.  She is taking hctz and losartan as prescribed.  ECG from 02/2017 reviewed.  OA: Mainly in her back and knees.  She takes naproxen as needed with some relief of symptoms.  She does not follow with orthopedics.  HLD with aortic atherosclerosis: Her last LDL was 100, triglycerides 96, 10/2022  She is taking atorvastatin every other day.  She is taking aspirin as prescribed.  She tries to consume a low-fat diet.  GERD: She is not sure what triggers this. She is having breakthrough on omeprazole.  Upper GI from 09/2019 reviewed.  Insomnia: She has difficulty falling and staying asleep.  She does not take any medications for this.  There is no sleep study on file.  Anemia: Her last H/H was 11.2/34, 10/2022.  She is not taking any iron at this time.  She does not follow with hematology.  Neutropenia: Her last WBC count was 3.6, 10/2022.  She does not follow with hematology.  Review of Systems     Past Medical History:  Diagnosis Date   Acid reflux 10/19/2014   Arthritis    arms   Back pain, chronic 10/19/2014   GERD (gastroesophageal reflux disease)    Hyperlipidemia    Hypertension    Insomnia, persistent 10/19/2014    Current Outpatient Medications  Medication Sig Dispense Refill   aspirin EC 81 MG tablet Take 81 mg by mouth daily. Swallow whole.     atorvastatin (LIPITOR) 40 MG tablet TAKE 1 TABLET BY MOUTH DAILY 90 tablet 3   conjugated estrogens (PREMARIN) vaginal cream Apply 0.5mg  (pea-sized amount)  just inside the vaginal introitus with a finger-tip on  Monday, Wednesday and Friday nights. 30 g 5   hydrochlorothiazide (HYDRODIURIL) 12.5 MG tablet Take 1 tablet (12.5 mg total) by mouth daily. 90 tablet 1   losartan (COZAAR) 50 MG tablet Take 1 tablet (50 mg total) by  mouth daily. 90 tablet 1   naproxen (NAPROSYN) 375 MG tablet Take 1 tablet (375 mg total) by mouth 2 (two) times daily with a meal. 10 tablet 0   omeprazole (PRILOSEC) 40 MG capsule TAKE 1 CAPSULE BY MOUTH DAILY 100 capsule 1   No current facility-administered medications for this visit.    No Known Allergies  Family History  Problem Relation Age of Onset   Diabetes Father        in her 59 and 47s   Hypertension Daughter        her 73s   Kidney cancer Neg Hx    Bladder Cancer Neg Hx     Social History   Socioeconomic History   Marital status: Single    Spouse name: Not on file   Number of children: Not on file   Years of education: Not on file   Highest education level: 12th grade  Occupational History   Occupation: retired  Tobacco Use   Smoking status: Never   Smokeless tobacco: Never  Vaping Use   Vaping status: Never Used  Substance and Sexual Activity   Alcohol use: No    Alcohol/week: 0.0 standard drinks of alcohol   Drug use: No   Sexual activity: Not Currently  Other Topics Concern   Not on file  Social History Narrative   **  Merged History Encounter **       Social Determinants of Health   Financial Resource Strain: Low Risk  (12/16/2022)   Overall Financial Resource Strain (CARDIA)    Difficulty of Paying Living Expenses: Not hard at all  Food Insecurity: No Food Insecurity (12/16/2022)   Hunger Vital Sign    Worried About Running Out of Food in the Last Year: Never true    Ran Out of Food in the Last Year: Never true  Transportation Needs: No Transportation Needs (12/16/2022)   PRAPARE - Administrator, Civil Service (Medical): No    Lack of Transportation (Non-Medical): No  Physical Activity: Inactive (12/16/2022)   Exercise Vital Sign    Days of Exercise per Week: 0 days    Minutes of Exercise per Session: 0 min  Stress: No Stress Concern Present (12/16/2022)   [employer] of Occupational Health - Occupational Stress Questionnaire     Feeling of Stress : Not at all  Social Connections: Socially Isolated (12/16/2022)   Social Connection and Isolation Panel [NHANES]    Frequency of Communication with Friends and Family: Once a week    Frequency of Social Gatherings with Friends and Family: Once a week    Attends Religious Services: More than 4 times per year    Active Member of [employer] or Organizations: No    Attends Banker Meetings: Never    Marital Status: Widowed  Intimate Partner Violence: Not At Risk (12/16/2022)   Humiliation, Afraid, Rape, and Kick questionnaire    Fear of Current or Ex-Partner: No    Emotionally Abused: No    Physically Abused: No    Sexually Abused: No     Constitutional: Denies fever, malaise, fatigue, headache or abrupt weight changes.  HEENT: Denies eye pain, eye redness, ear pain, ringing in the ears, wax buildup, runny nose, nasal congestion, bloody nose, or sore throat. Respiratory: Denies difficulty breathing, shortness of breath, cough or sputum production.   Cardiovascular: Pt reports intermittent swelling in legs. Denies chest pain, chest tightness, palpitations or swelling in the hands.  Gastrointestinal: Pt reports reflux, epigastric pain. Denies bloating, constipation, diarrhea or blood in the stool.  GU: Denies urgency, frequency, pain with urination, burning sensation, blood in urine, odor or discharge. Musculoskeletal: Patient reports joint pain.  Denies decrease in range of motion, difficulty with gait, muscle pain or joint swelling.  Skin: Denies redness, rashes, lesions or ulcercations.  Neurological: Patient reports insomnia.  Denies dizziness, difficulty with memory, difficulty with speech or problems with balance and coordination.  Psych: Denies anxiety, depression, SI/HI.  No other specific complaints in a complete review of systems (except as listed in HPI above).  Objective:   BP 118/70   Ht 4\' 11"  (1.499 m)   Wt 172 lb 12.8 oz (78.4 kg)   BMI 34.90 kg/m     Wt Readings from Last 3 Encounters:  10/21/22 177 lb (80.3 kg)  06/02/22 184 lb 11.2 oz (83.8 kg)  12/06/21 190 lb (86.2 kg)    General: Appears her stated age, obese in NAD. Skin: Warm, dry and intact.  HEENT: Head: normal shape and size; Eyes: sclera white, no icterus, conjunctiva pink, PERRLA and EOMs intact;  Cardiovascular: Normal rate and rhythm. S1,S2 noted.  No murmur, rubs or gallops noted. No JVD or BLE edema. No carotid bruits noted. Pulmonary/Chest: Normal effort and positive vesicular breath sounds. No respiratory distress. No wheezes, rales or ronchi noted.  Abdomen: Soft and nontender. Normal  bowel sounds. No distention or masses noted.  Musculoskeletal: No difficulty with gait.  Neurological: Alert and oriented Psychiatric: Mood and affect normal. Behavior is normal. Judgment and thought content normal.    BMET    Component Value Date/Time   NA 137 10/21/2022 1011   K 4.4 10/21/2022 1011   CL 100 10/21/2022 1011   CO2 29 10/21/2022 1011   GLUCOSE 84 10/21/2022 1011   BUN 12 10/21/2022 1011   BUN 14 01/04/2017 1622   CREATININE 0.74 10/21/2022 1011   CALCIUM 9.6 10/21/2022 1011   GFRNONAA 81 08/30/2019 0911   GFRAA 94 08/30/2019 0911    Lipid Panel     Component Value Date/Time   CHOL 192 10/21/2022 1011   TRIG 96 10/21/2022 1011   HDL 73 10/21/2022 1011   CHOLHDL 2.6 10/21/2022 1011   LDLCALC 100 (H) 10/21/2022 1011    CBC    Component Value Date/Time   WBC 3.6 (L) 10/21/2022 1011   RBC 3.60 (L) 10/21/2022 1011   HGB 11.2 (L) 10/21/2022 1011   HCT 34.0 (L) 10/21/2022 1011   PLT 206 10/21/2022 1011   MCV 94.4 10/21/2022 1011   MCH 31.1 10/21/2022 1011   MCHC 32.9 10/21/2022 1011   RDW 11.9 10/21/2022 1011   LYMPHSABS 1,613 07/09/2021 0909   EOSABS 30 07/09/2021 0909   BASOSABS 19 07/09/2021 0909    Hgb A1C Lab Results  Component Value Date   HGBA1C 5.2 10/21/2022            Assessment & Plan:    RTC in 6 months for your  annual exam Nicki Reaper, NP

## 2023-04-24 NOTE — Assessment & Plan Note (Signed)
CBC today.  

## 2023-04-24 NOTE — Assessment & Plan Note (Signed)
Encouraged diet and exercise for weight loss ?

## 2023-04-24 NOTE — Patient Instructions (Signed)
Iron Deficiency Anemia, Adult  Iron deficiency anemia is a condition in which the concentration of red blood cells or hemoglobin in the blood is below normal because of too little iron. Hemoglobin is a substance in red blood cells that carries oxygen to the body's tissues. When the concentration of red blood cells or hemoglobin is too low, not enough oxygen reaches these tissues. Iron deficiency anemia is usually long-lasting, and it develops over time. It may or may not cause symptoms. It is a common type of anemia. What are the causes? This condition may be caused by: Not enough iron in the diet. Abnormal absorption in the gut. Blood loss. What increases the risk? You are more likely to develop this condition if you get menstrual periods (menstruate) or are pregnant. What are the signs or symptoms? Symptoms of this condition may include: Pale skin, lips, and nail beds. Weakness, dizziness, and getting tired easily. Shortness of breath when moving or exercising. Cold hands or feet. Mild anemia may not cause any symptoms. How is this diagnosed? This condition is diagnosed based on: Your medical history. A physical exam. Blood tests. How is this treated? This condition is treated by correcting the cause of your iron deficiency. Treatment may involve: Adding iron-rich foods to your diet. Taking iron supplements. If you are pregnant or breastfeeding, you may need to take extra iron because your normal diet usually does not provide the amount of iron that you need. Increasing vitamin C intake. Vitamin C helps your body absorb iron. Your health care provider may recommend that you take iron supplements along with a glass of orange juice or a vitamin C supplement. Medicines to make heavy menstrual flow lighter. Surgery or additional testing procedures to determine the cause of your anemia. You may need repeat blood tests to determine whether treatment is working. If the treatment does not  seem to be working, you may need more tests. Follow these instructions at home: Medicines Take over-the-counter and prescription medicines only as told by your health care provider. This includes iron supplements and vitamins. This is important because too much iron can be harmful. For the best iron absorption, you should take iron supplements when your stomach is empty. If you cannot tolerate them on an empty stomach, you may need to take them with food. Do not drink milk or take antacids at the same time as your iron supplements. Milk and antacids may interfere with how your body absorbs iron. Iron supplements may turn stool (feces) a darker color and it may appear black. If you cannot tolerate taking iron supplements by mouth, talk with your health care provider about taking them through an IV or through an injection into a muscle. Eating and drinking Talk with your health care provider before changing your diet. Your provider may recommend that you eat foods that contain a lot of iron, such as: Liver. Low-fat (lean) beef. Breads and cereals that have iron added to them (are fortified). Eggs. Dried fruit. Dark green, leafy vegetables. To help your body use the iron from iron-rich foods, eat those foods at the same time as fresh fruits and vegetables that are high in vitamin C. Foods that are high in vitamin C include: Oranges. Peppers. Tomatoes. Mangoes. Managing constipation If you are taking an iron supplement, it may cause constipation. To prevent or treat constipation, you may need to: Drink enough fluid to keep your urine pale yellow. Take over-the-counter or prescription medicines. Eat foods that are high in fiber, such   as beans, whole grains, and fresh fruits and vegetables. Limit foods that are high in fat and processed sugars, such as fried or sweet foods. General instructions Return to your normal activities as told by your health care provider. Ask your health care provider  what activities are safe for you. Keep all follow-up visits. Contact a health care provider if: You feel nauseous or you vomit. You feel weak. You become light-headed when getting up from a sitting or lying down position. You have unexplained sweating. You develop symptoms of constipation. You have a heaviness in your chest. You have trouble breathing with physical activity. Get help right away if: You faint. If this happens, do not drive yourself to the hospital. You have an irregular or rapid heartbeat. Summary Iron deficiency anemia is a condition in which the concentration of red blood cells or hemoglobin in the blood is below normal because of too little iron. This condition is treated by correcting the cause of your iron deficiency. Take over-the-counter and prescription medicines only as told by your health care provider. This includes iron supplements and vitamins. To help your body use the iron from iron-rich foods, eat those foods at the same time as fresh fruits and vegetables that are high in vitamin C. Seek medical help if you have signs or symptoms of worsening anemia. This information is not intended to replace advice given to you by your health care provider. Make sure you discuss any questions you have with your health care provider. Document Revised: 06/09/2021 Document Reviewed: 06/09/2021 Elsevier Patient Education  2024 Elsevier Inc.  

## 2023-04-24 NOTE — Assessment & Plan Note (Signed)
Encouraged her to consume a low-fat diet Continue atorvastatin and aspirin C-Met and lipid profile today

## 2023-04-25 LAB — CBC
HCT: 35.9 % (ref 35.0–45.0)
Hemoglobin: 12.1 g/dL (ref 11.7–15.5)
MCH: 32 pg (ref 27.0–33.0)
MCHC: 33.7 g/dL (ref 32.0–36.0)
MCV: 95 fL (ref 80.0–100.0)
MPV: 10.6 fL (ref 7.5–12.5)
Platelets: 198 10*3/uL (ref 140–400)
RBC: 3.78 10*6/uL — ABNORMAL LOW (ref 3.80–5.10)
RDW: 11.7 % (ref 11.0–15.0)
WBC: 3 10*3/uL — ABNORMAL LOW (ref 3.8–10.8)

## 2023-04-25 LAB — HEMOGLOBIN A1C
Hgb A1c MFr Bld: 5.1 %{Hb} (ref <5.7)
Mean Plasma Glucose: 100 mg/dL
eAG (mmol/L): 5.5 mmol/L

## 2023-04-25 LAB — COMPLETE METABOLIC PANEL WITH GFR
AG Ratio: 1.6 (calc) (ref 1.0–2.5)
ALT: 12 U/L (ref 6–29)
AST: 21 U/L (ref 10–35)
Albumin: 4.5 g/dL (ref 3.6–5.1)
Alkaline phosphatase (APISO): 49 U/L (ref 37–153)
BUN: 13 mg/dL (ref 7–25)
CO2: 30 mmol/L (ref 20–32)
Calcium: 10 mg/dL (ref 8.6–10.4)
Chloride: 101 mmol/L (ref 98–110)
Creat: 0.8 mg/dL (ref 0.60–0.95)
Globulin: 2.9 g/dL (ref 1.9–3.7)
Glucose, Bld: 92 mg/dL (ref 65–99)
Potassium: 3.8 mmol/L (ref 3.5–5.3)
Sodium: 139 mmol/L (ref 135–146)
Total Bilirubin: 0.8 mg/dL (ref 0.2–1.2)
Total Protein: 7.4 g/dL (ref 6.1–8.1)
eGFR: 74 mL/min/{1.73_m2} (ref >=60)

## 2023-04-25 LAB — LIPID PANEL
Cholesterol: 176 mg/dL (ref <200)
HDL: 71 mg/dL (ref >=50)
LDL Cholesterol (Calc): 90 mg/dL
Non-HDL Cholesterol (Calc): 105 mg/dL (ref <130)
Total CHOL/HDL Ratio: 2.5 (calc) (ref <5.0)
Triglycerides: 61 mg/dL (ref <150)

## 2023-06-07 ENCOUNTER — Other Ambulatory Visit: Payer: Self-pay | Admitting: Internal Medicine

## 2023-06-07 DIAGNOSIS — Z0001 Encounter for general adult medical examination with abnormal findings: Secondary | ICD-10-CM

## 2023-06-07 NOTE — Telephone Encounter (Signed)
Requested Prescriptions  Pending Prescriptions Disp Refills   losartan (COZAAR) 50 MG tablet [Pharmacy Med Name: Losartan Potassium 50 MG Oral Tablet] 80 tablet 3    Sig: TAKE 1 TABLET BY MOUTH DAILY     Cardiovascular:  Angiotensin Receptor Blockers Passed - 06/07/2023  1:57 PM      Passed - Cr in normal range and within 180 days    Creat  Date Value Ref Range Status  04/24/2023 0.80 0.60 - 0.95 mg/dL Final         Passed - K in normal range and within 180 days    Potassium  Date Value Ref Range Status  04/24/2023 3.8 3.5 - 5.3 mmol/L Final         Passed - Patient is not pregnant      Passed - Last BP in normal range    BP Readings from Last 1 Encounters:  04/24/23 118/70         Passed - Valid encounter within last 6 months    Recent Outpatient Visits           1 month ago Pure hypercholesterolemia   [place] [facility] [place], [place], NP   7 months ago Encounter for general adult medical examination with abnormal findings   [place] [facility] [place], Salvadore Oxford, NP   1 year ago Pure hypercholesterolemia   [place] [facility] [place], [place], NP   2 years ago Need for immunization against influenza   [place] [facility] [place], [place], NP   2 years ago Aortic atherosclerosis [facility])   [place] [facility] [place], Salvadore Oxford, NP       Future Appointments             In 4 months Baity, Salvadore Oxford, NP [place] [facility]             hydrochlorothiazide (HYDRODIURIL) 12.5 MG tablet [Pharmacy Med Name: hydroCHLOROthiazide 12.5 MG Oral Tablet] 80 tablet 3    Sig: TAKE 1 TABLET BY MOUTH DAILY     Cardiovascular: Diuretics - Thiazide Passed - 06/07/2023  1:57 PM      Passed - Cr in normal range and within 180 days    Creat  Date Value Ref Range Status  04/24/2023 0.80 0.60 - 0.95 mg/dL Final         Passed - K in normal range and within 180 days     Potassium  Date Value Ref Range Status  04/24/2023 3.8 3.5 - 5.3 mmol/L Final         Passed - Na in normal range and within 180 days    Sodium  Date Value Ref Range Status  04/24/2023 139 135 - 146 mmol/L Final         Passed - Last BP in normal range    BP Readings from Last 1 Encounters:  04/24/23 118/70         Passed - Valid encounter within last 6 months    Recent Outpatient Visits           1 month ago Pure hypercholesterolemia   [place] [facility] [place], Salvadore Oxford, NP   7 months ago Encounter for general adult medical examination with abnormal findings   [place] [facility] [place], Salvadore Oxford, NP   1 year ago Pure hypercholesterolemia   [facility] [place]  [facility] [place], Salvadore Oxford, NP   2 years ago Need for immunization against influenza   [facility] [place], Salvadore Oxford, NP   2 years ago Aortic atherosclerosis [facility])   [place] [facility] [place], Salvadore Oxford, NP       Future Appointments             In 4 months Baity, Salvadore Oxford, NP [place] [facility], [facility]

## 2023-08-16 ENCOUNTER — Other Ambulatory Visit: Payer: Self-pay | Admitting: Internal Medicine

## 2023-08-16 DIAGNOSIS — K219 Gastro-esophageal reflux disease without esophagitis: Secondary | ICD-10-CM

## 2023-08-18 NOTE — Telephone Encounter (Signed)
 Last OV 04/24/23 with protocol.  Requested Prescriptions  Pending Prescriptions Disp Refills   omeprazole (PRILOSEC) 40 MG capsule [Pharmacy Med Name: Omeprazole 40 MG Oral Capsule Delayed Release] 100 capsule 0    Sig: TAKE 1 CAPSULE BY MOUTH DAILY     Gastroenterology: Proton Pump Inhibitors Failed - 08/18/2023  9:50 AM      Failed - Valid encounter within last 12 months    Recent Outpatient Visits   None     Future Appointments             In 2 months Baity, Salvadore Oxford, NP [place] [facility], [facility]

## 2023-08-20 ENCOUNTER — Other Ambulatory Visit: Payer: Self-pay | Admitting: Internal Medicine

## 2023-08-20 DIAGNOSIS — Z0001 Encounter for general adult medical examination with abnormal findings: Secondary | ICD-10-CM

## 2023-08-21 NOTE — Telephone Encounter (Signed)
 Requested Prescriptions  Refused Prescriptions Disp Refills   losartan (COZAAR) 50 MG tablet [Pharmacy Med Name: Losartan Potassium 50 MG Oral Tablet] 100 tablet 2    Sig: TAKE 1 TABLET BY MOUTH DAILY     Cardiovascular:  Angiotensin Receptor Blockers Failed - 08/21/2023  5:21 PM      Failed - Valid encounter within last 6 months    Recent Outpatient Visits   None     Future Appointments             In 2 months Baity, Salvadore Oxford, NP [place] [facility], [facility]            Passed - Cr in normal range and within 180 days    Creat  Date Value Ref Range Status  04/24/2023 0.80 0.60 - 0.95 mg/dL Final         Passed - K in normal range and within 180 days    Potassium  Date Value Ref Range Status  04/24/2023 3.8 3.5 - 5.3 mmol/L Final         Passed - Patient is not pregnant      Passed - Last BP in normal range    BP Readings from Last 1 Encounters:  04/24/23 118/70          hydrochlorothiazide (HYDRODIURIL) 12.5 MG tablet [Pharmacy Med Name: hydroCHLOROthiazide 12.5 MG Oral Tablet] 100 tablet 2    Sig: TAKE 1 TABLET BY MOUTH DAILY     Cardiovascular: Diuretics - Thiazide Failed - 08/21/2023  5:21 PM      Failed - Valid encounter within last 6 months    Recent Outpatient Visits   None     Future Appointments             In 2 months Baity, Salvadore Oxford, NP [facility], [facility]            Passed - Cr in normal range and within 180 days    Creat  Date Value Ref Range Status  04/24/2023 0.80 0.60 - 0.95 mg/dL Final         Passed - K in normal range and within 180 days    Potassium  Date Value Ref Range Status  04/24/2023 3.8 3.5 - 5.3 mmol/L Final         Passed - Na in normal range and within 180 days    Sodium  Date Value Ref Range Status  04/24/2023 139 135 - 146 mmol/L Final         Passed - Last BP in normal range    BP Readings from Last 1 Encounters:  04/24/23 118/70

## 2023-09-19 ENCOUNTER — Other Ambulatory Visit: Payer: Self-pay | Admitting: Internal Medicine

## 2023-09-19 DIAGNOSIS — K219 Gastro-esophageal reflux disease without esophagitis: Secondary | ICD-10-CM

## 2023-09-19 DIAGNOSIS — Z0001 Encounter for general adult medical examination with abnormal findings: Secondary | ICD-10-CM

## 2023-09-21 NOTE — Telephone Encounter (Signed)
 Rx - losartin, [facility]- 06/07/23 #90 1RF- too soon         Omeprazole - 08/18/23 #100- too soon Requested Prescriptions  Pending Prescriptions Disp Refills   losartan  (COZAAR ) 50 MG tablet [Pharmacy Med Name: Losartan  Potassium 50 MG Oral Tablet] 80 tablet 3    Sig: TAKE 1 TABLET BY MOUTH DAILY     Cardiovascular:  Angiotensin Receptor Blockers Failed - 09/21/2023  3:34 PM      Failed - Valid encounter within last 6 months    Recent Outpatient Visits   None     Future Appointments             In 1 month Baity, Rankin Buzzard, NP [facility], [facility]            Passed - Cr in normal range and within 180 days    Creat  Date Value Ref Range Status  04/24/2023 0.80 0.60 - 0.95 mg/dL Final         Passed - K in normal range and within 180 days    Potassium  Date Value Ref Range Status  04/24/2023 3.8 3.5 - 5.3 mmol/L Final         Passed - Patient is not pregnant      Passed - Last BP in normal range    BP Readings from Last 1 Encounters:  04/24/23 118/70          hydrochlorothiazide  (HYDRODIURIL ) 12.5 MG tablet [Pharmacy Med Name: hydroCHLOROthiazide  12.5 MG Oral Tablet] 80 tablet 3    Sig: TAKE 1 TABLET BY MOUTH DAILY     Cardiovascular: Diuretics - Thiazide Failed - 09/21/2023  3:34 PM      Failed - Valid encounter within last 6 months    Recent Outpatient Visits   None     Future Appointments             In 1 month Baity, Rankin Buzzard, NP [place] [facility]            Passed - Cr in normal range and within 180 days    Creat  Date Value Ref Range Status  04/24/2023 0.80 0.60 - 0.95 mg/dL Final         Passed - K in normal range and within 180 days    Potassium  Date Value Ref Range Status  04/24/2023 3.8 3.5 - 5.3 mmol/L Final         Passed - Na in normal range and within 180 days    Sodium  Date Value Ref Range Status  04/24/2023 139 135 - 146 mmol/L Final         Passed - Last BP in normal range    BP  Readings from Last 1 Encounters:  04/24/23 118/70          omeprazole  (PRILOSEC) 40 MG capsule [Pharmacy Med Name: Omeprazole  40 MG Oral Capsule Delayed Release] 100 capsule 2    Sig: TAKE 1 CAPSULE BY MOUTH DAILY     Gastroenterology: Proton Pump Inhibitors Failed - 09/21/2023  3:34 PM      Failed - Valid encounter within last 12 months    Recent Outpatient Visits   None     Future Appointments             In 1 month Baity, Rankin Buzzard, NP [place] [facility], [facility]

## 2023-10-23 ENCOUNTER — Ambulatory Visit (INDEPENDENT_AMBULATORY_CARE_PROVIDER_SITE_OTHER): Payer: Self-pay | Admitting: Internal Medicine

## 2023-10-23 ENCOUNTER — Encounter: Payer: Self-pay | Admitting: Internal Medicine

## 2023-10-23 VITALS — BP 124/78 | Ht 59.0 in | Wt 181.0 lb

## 2023-10-23 DIAGNOSIS — N898 Other specified noninflammatory disorders of vagina: Secondary | ICD-10-CM

## 2023-10-23 DIAGNOSIS — E7800 Pure hypercholesterolemia, unspecified: Secondary | ICD-10-CM | POA: Diagnosis not present

## 2023-10-23 DIAGNOSIS — Z0001 Encounter for general adult medical examination with abnormal findings: Secondary | ICD-10-CM | POA: Diagnosis not present

## 2023-10-23 DIAGNOSIS — D709 Neutropenia, unspecified: Secondary | ICD-10-CM

## 2023-10-23 DIAGNOSIS — Z6836 Body mass index (BMI) 36.0-36.9, adult: Secondary | ICD-10-CM

## 2023-10-23 DIAGNOSIS — R739 Hyperglycemia, unspecified: Secondary | ICD-10-CM

## 2023-10-23 DIAGNOSIS — E66812 Obesity, class 2: Secondary | ICD-10-CM | POA: Diagnosis not present

## 2023-10-23 DIAGNOSIS — K219 Gastro-esophageal reflux disease without esophagitis: Secondary | ICD-10-CM

## 2023-10-23 MED ORDER — FLUCONAZOLE 150 MG PO TABS: 150.0000 mg | ORAL_TABLET | Freq: Once | ORAL | 0 refills | Status: AC

## 2023-10-23 MED ORDER — OMEPRAZOLE 40 MG PO CPDR: 40.0000 mg | DELAYED_RELEASE_CAPSULE | Freq: Two times a day (BID) | ORAL | 1 refills | Status: DC

## 2023-10-23 NOTE — Patient Instructions (Signed)
 Health Maintenance for Postmenopausal Women Menopause is a normal process in which your ability to get pregnant comes to an end. This process happens slowly over many months or years, usually between the ages of 24 and 62. Menopause is complete when you have missed your menstrual period for 12 months. It is important to talk with your health care provider about some of the most common conditions that affect women after menopause (postmenopausal women). These include heart disease, cancer, and bone loss (osteoporosis). Adopting a healthy lifestyle and getting preventive care can help to promote your health and wellness. The actions you take can also lower your chances of developing some of these common conditions. What are the signs and symptoms of menopause? During menopause, you may have the following symptoms: Hot flashes. These can be moderate or severe. Night sweats. Decrease in sex drive. Mood swings. Headaches. Tiredness (fatigue). Irritability. Memory problems. Problems falling asleep or staying asleep. Talk with your health care provider about treatment options for your symptoms. Do I need hormone replacement therapy? Hormone replacement therapy is effective in treating symptoms that are caused by menopause, such as hot flashes and night sweats. Hormone replacement carries certain risks, especially as you become older. If you are thinking about using estrogen or estrogen with progestin, discuss the benefits and risks with your health care provider. How can I reduce my risk for heart disease and stroke? The risk of heart disease, heart attack, and stroke increases as you age. One of the causes may be a change in the body's hormones during menopause. This can affect how your body uses dietary fats, triglycerides, and cholesterol. Heart attack and stroke are medical emergencies. There are many things that you can do to help prevent heart disease and stroke. Watch your blood pressure High  blood pressure causes heart disease and increases the risk of stroke. This is more likely to develop in people who have high blood pressure readings or are overweight. Have your blood pressure checked: Every 3-5 years if you are 50-75 years of age. Every year if you are 77 years old or older. Eat a healthy diet  Eat a diet that includes plenty of vegetables, fruits, low-fat dairy products, and lean protein. Do not eat a lot of foods that are high in solid fats, added sugars, or sodium. Get regular exercise Get regular exercise. This is one of the most important things you can do for your health. Most adults should: Try to exercise for at least 150 minutes each week. The exercise should increase your heart rate and make you sweat (moderate-intensity exercise). Try to do strengthening exercises at least twice each week. Do these in addition to the moderate-intensity exercise. Spend less time sitting. Even light physical activity can be beneficial. Other tips Work with your health care provider to achieve or maintain a healthy weight. Do not use any products that contain nicotine or tobacco. These products include cigarettes, chewing tobacco, and vaping devices, such as e-cigarettes. If you need help quitting, ask your health care provider. Know your numbers. Ask your health care provider to check your cholesterol and your blood sugar (glucose). Continue to have your blood tested as directed by your health care provider. Do I need screening for cancer? Depending on your health history and family history, you may need to have cancer screenings at different stages of your life. This may include screening for: Breast cancer. Cervical cancer. Lung cancer. Colorectal cancer. What is my risk for osteoporosis? After menopause, you may be  at increased risk for osteoporosis. Osteoporosis is a condition in which bone destruction happens more quickly than new bone creation. To help prevent osteoporosis or  the bone fractures that can happen because of osteoporosis, you may take the following actions: If you are 61-3 years old, get at least 1,000 mg of calcium and at least 600 international units (IU) of vitamin D per day. If you are older than age 61 but younger than age 75, get at least 1,200 mg of calcium and at least 600 international units (IU) of vitamin D per day. If you are older than age 62, get at least 1,200 mg of calcium and at least 800 international units (IU) of vitamin D per day. Smoking and drinking excessive alcohol increase the risk of osteoporosis. Eat foods that are rich in calcium and vitamin D, and do weight-bearing exercises several times each week as directed by your health care provider. How does menopause affect my mental health? Depression may occur at any age, but it is more common as you become older. Common symptoms of depression include: Feeling depressed. Changes in sleep patterns. Changes in appetite or eating patterns. Feeling an overall lack of motivation or enjoyment of activities that you previously enjoyed. Frequent crying spells. Talk with your health care provider if you think that you are experiencing any of these symptoms. General instructions See your health care provider for regular wellness exams and vaccines. This may include: Scheduling regular health, dental, and eye exams. Getting and maintaining your vaccines. These include: Influenza vaccine. Get this vaccine each year before the flu season begins. Pneumonia vaccine. Shingles vaccine. Tetanus, diphtheria, and pertussis (Tdap) booster vaccine. Your health care provider may also recommend other immunizations. Tell your health care provider if you have ever been abused or do not feel safe at home. Summary Menopause is a normal process in which your ability to get pregnant comes to an end. This condition causes hot flashes, night sweats, decreased interest in sex, mood swings, headaches, or lack  of sleep. Treatment for this condition may include hormone replacement therapy. Take actions to keep yourself healthy, including exercising regularly, eating a healthy diet, watching your weight, and checking your blood pressure and blood sugar levels. Get screened for cancer and depression. Make sure that you are up to date with all your vaccines. This information is not intended to replace advice given to you by your health care provider. Make sure you discuss any questions you have with your health care provider. Document Revised: 09/21/2020 Document Reviewed: 09/21/2020 Elsevier Patient Education  2024 [employer].

## 2023-10-23 NOTE — Progress Notes (Addendum)
 Subjective:    Patient ID: Tracey Carroll, female    DOB: 08/07/1940, 83 y.o.   MRN: [id number]  HPI  Patient presents to clinic today for her annual exam.  Flu: 05/2021 Tetanus: 07/2013 COVID: Moderna x 2 Pneumovax: 12/2014 Prevnar 20: 10/2022 Shingrix: Never Pap smear: No longer screening Mammogram: 12/2016 Bone density: 06/2021 Colon screening: No longer screening Vision screening: annually Dentist: annually  Diet: She eats mostly lean meat. She consumes more veggies than fruits. She tries to avoid fried foods. She drinks mostly water, coffee. Exercise: None   Review of Systems     Past Medical History:  Diagnosis Date   Acid reflux 10/19/2014   Arthritis    arms   Back pain, chronic 10/19/2014   GERD (gastroesophageal reflux disease)    Hyperlipidemia    Hypertension    Insomnia, persistent 10/19/2014    Current Outpatient Medications  Medication Sig Dispense Refill   aspirin  EC 81 MG tablet Take 81 mg by mouth daily. Swallow whole.     atorvastatin  (LIPITOR) 40 MG tablet TAKE 1 TABLET BY MOUTH DAILY 90 tablet 3   conjugated estrogens  (PREMARIN ) vaginal cream Apply 0.5mg  (pea-sized amount)  just inside the vaginal introitus with a finger-tip on  Monday, Wednesday and Friday nights. 30 g 5   hydrochlorothiazide  (HYDRODIURIL ) 12.5 MG tablet TAKE 1 TABLET BY MOUTH DAILY 90 tablet 1   losartan  (COZAAR ) 50 MG tablet TAKE 1 TABLET BY MOUTH DAILY 90 tablet 1   naproxen  (NAPROSYN ) 375 MG tablet Take 1 tablet (375 mg total) by mouth 2 (two) times daily with a meal. (Patient not taking: Reported on 04/24/2023) 10 tablet 0   omeprazole  (PRILOSEC) 40 MG capsule TAKE 1 CAPSULE BY MOUTH DAILY 100 capsule 0   No current facility-administered medications for this visit.    No Known Allergies  Family History  Problem Relation Age of Onset   Diabetes Father        in her 35 and 59s   Hypertension Daughter        her 15s   Kidney cancer Neg Hx    Bladder Cancer Neg Hx      Social History   Socioeconomic History   Marital status: Single    Spouse name: Not on file   Number of children: Not on file   Years of education: Not on file   Highest education level: 12th grade  Occupational History   Occupation: retired  Tobacco Use   Smoking status: Never   Smokeless tobacco: Never  Vaping Use   Vaping status: Never Used  Substance and Sexual Activity   Alcohol use: No    Alcohol/week: 0.0 standard drinks of alcohol   Drug use: No   Sexual activity: Not Currently  Other Topics Concern   Not on file  Social History Narrative   ** Merged History Encounter **       Social Drivers of Health   Financial Resource Strain: Low Risk  (12/16/2022)   Overall Financial Resource Strain (CARDIA)    Difficulty of Paying Living Expenses: Not hard at all  Food Insecurity: No Food Insecurity (12/16/2022)   Hunger Vital Sign    Worried About Running Out of Food in the Last Year: Never true    Ran Out of Food in the Last Year: Never true  Transportation Needs: No Transportation Needs (12/16/2022)   PRAPARE - Administrator, Civil Service (Medical): No    Lack of Transportation (Non-Medical): No  Physical Activity: Inactive (12/16/2022)   Exercise Vital Sign    Days of Exercise per Week: 0 days    Minutes of Exercise per Session: 0 min  Stress: No Stress Concern Present (12/16/2022)   [employer] of Occupational Health - Occupational Stress Questionnaire    Feeling of Stress : Not at all  Social Connections: Socially Isolated (12/16/2022)   Social Connection and Isolation Panel [NHANES]    Frequency of Communication with Friends and Family: Once a week    Frequency of Social Gatherings with Friends and Family: Once a week    Attends Religious Services: More than 4 times per year    Active Member of [employer] or Organizations: No    Attends Banker Meetings: Never    Marital Status: Widowed  Intimate Partner Violence: Not At Risk (12/16/2022)    Humiliation, Afraid, Rape, and Kick questionnaire    Fear of Current or Ex-Partner: No    Emotionally Abused: No    Physically Abused: No    Sexually Abused: No     Constitutional: Denies fever, malaise, fatigue, headache or abrupt weight changes.  HEENT: Denies eye pain, eye redness, ear pain, ringing in the ears, wax buildup, runny nose, nasal congestion, bloody nose, or sore throat. Respiratory: Denies difficulty breathing, shortness of breath, cough or sputum production.   Cardiovascular: Denies chest pain, chest tightness, palpitations or swelling in the hands or feet.  Gastrointestinal: Patient reports intermittent reflux, constipation.  Denies abdominal pain, bloating, diarrhea or blood in the stool.  GU: Patient reports vaginal itching.  Denies urgency, frequency, pain with urination, burning sensation, blood in urine, odor or discharge. Musculoskeletal: Patient reports joint pain.  Denies decrease in range of motion, difficulty with gait, muscle pain or joint swelling.  Skin: Denies redness, rashes, lesions or ulcercations.  Neurological: Patient reports insomnia.  Denies dizziness, difficulty with memory, difficulty with speech or problems with balance and coordination.  Psych: Denies anxiety, depression, SI/HI.  No other specific complaints in a complete review of systems (except as listed in HPI above).  Objective:   Physical Exam  BP 124/78 (BP Location: Left Arm, Patient Position: Sitting, Cuff Size: Normal)   Ht 4\' 11"  (1.499 m)   Wt 181 lb (82.1 kg)   BMI 36.56 kg/m    Wt Readings from Last 3 Encounters:  04/24/23 172 lb 12.8 oz (78.4 kg)  10/21/22 177 lb (80.3 kg)  06/02/22 184 lb 11.2 oz (83.8 kg)    General: Appears her stated age, obese in NAD. Skin: Warm, dry and intact.  HEENT: Head: normal shape and size; Eyes: sclera white, no icterus, conjunctiva pink, PERRLA and EOMs intact;  Neck:  Neck supple, trachea midline. No masses, lumps or thyromegaly  present.  Cardiovascular: Normal rate and rhythm. S1,S2 noted.  No murmur, rubs or gallops noted. No JVD or BLE edema. No carotid bruits noted. Pulmonary/Chest: Normal effort and positive vesicular breath sounds. No respiratory distress. No wheezes, rales or ronchi noted.  Abdomen: Soft and nontender. Normal bowel sounds.  Musculoskeletal: She has difficulty getting from a sitting to a standing position.  Joint enlargement noted in bilateral hands and knees.  Strength 5/5 BUE/BLE.  No difficulty with gait.  Neurological: Alert and oriented. Cranial nerves II-XII grossly intact. Coordination normal.  Psychiatric: Mood and affect normal. Behavior is normal. Judgment and thought content normal.    BMET    Component Value Date/Time   NA 139 04/24/2023 0851   K 3.8 04/24/2023 0851  CL 101 04/24/2023 0851   CO2 30 04/24/2023 0851   GLUCOSE 92 04/24/2023 0851   BUN 13 04/24/2023 0851   BUN 14 01/04/2017 1622   CREATININE 0.80 04/24/2023 0851   CALCIUM  10.0 04/24/2023 0851   GFRNONAA 81 08/30/2019 0911   GFRAA 94 08/30/2019 0911    Lipid Panel     Component Value Date/Time   CHOL 176 04/24/2023 0851   TRIG 61 04/24/2023 0851   HDL 71 04/24/2023 0851   CHOLHDL 2.5 04/24/2023 0851   LDLCALC 90 04/24/2023 0851    CBC    Component Value Date/Time   WBC 3.0 (L) 04/24/2023 0851   RBC 3.78 (L) 04/24/2023 0851   HGB 12.1 04/24/2023 0851   HCT 35.9 04/24/2023 0851   PLT 198 04/24/2023 0851   MCV 95.0 04/24/2023 0851   MCH 32.0 04/24/2023 0851   MCHC 33.7 04/24/2023 0851   RDW 11.7 04/24/2023 0851   LYMPHSABS 1,613 07/09/2021 0909   EOSABS 30 07/09/2021 0909   BASOSABS 19 07/09/2021 0909    Hgb A1C Lab Results  Component Value Date   HGBA1C 5.1 04/24/2023           Assessment & Plan:   Preventative Health Maintenance:  Encouraged her to get a flu shot in the fall She declines tetanus for financial reasons.  Advised her if she gets bit or cut to go get this  done. Encouraged her to get her COVID booster Pneumovax and Prevnar 20 UTD Discussed Shingrix vaccine, she will check coverage with her insurance company and schedule visit she would like to have this done She no longer wants to screen for cervical cancer She no longer wants to screen for breast cancer Bone density UTD She no longer wants to screen for colon cancer Encouraged her to consume a balanced diet and exercise regimen Advised her to see an eye doctor and dentist annually Will check CBC, c-Met, lipid, A1c today  Vaginal itching:  Rx for Diflucan 150 mg p.o. x 1  RTC in 6 months, follow-up chronic conditions Helayne Lo, NP

## 2023-10-23 NOTE — Assessment & Plan Note (Signed)
 Encouraged diet and exercise for weight loss ?

## 2023-10-24 ENCOUNTER — Ambulatory Visit: Payer: Self-pay | Admitting: Internal Medicine

## 2023-10-24 LAB — COMPREHENSIVE METABOLIC PANEL WITH GFR
AG Ratio: 1.6 (calc) (ref 1.0–2.5)
ALT: 17 U/L (ref 6–29)
AST: 24 U/L (ref 10–35)
Albumin: 4.5 g/dL (ref 3.6–5.1)
Alkaline phosphatase (APISO): 52 U/L (ref 37–153)
BUN: 13 mg/dL (ref 7–25)
CO2: 29 mmol/L (ref 20–32)
Calcium: 9.5 mg/dL (ref 8.6–10.4)
Chloride: 101 mmol/L (ref 98–110)
Creat: 0.64 mg/dL (ref 0.60–0.95)
Globulin: 2.8 g/dL (ref 1.9–3.7)
Glucose, Bld: 96 mg/dL (ref 65–99)
Potassium: 4.3 mmol/L (ref 3.5–5.3)
Sodium: 138 mmol/L (ref 135–146)
Total Bilirubin: 0.7 mg/dL (ref 0.2–1.2)
Total Protein: 7.3 g/dL (ref 6.1–8.1)
eGFR: 89 mL/min/{1.73_m2} (ref >=60)

## 2023-10-24 LAB — CBC
HCT: 35.6 % (ref 35.0–45.0)
Hemoglobin: 11.8 g/dL (ref 11.7–15.5)
MCH: 31.6 pg (ref 27.0–33.0)
MCHC: 33.1 g/dL (ref 32.0–36.0)
MCV: 95.4 fL (ref 80.0–100.0)
MPV: 10.5 fL (ref 7.5–12.5)
Platelets: 171 10*3/uL (ref 140–400)
RBC: 3.73 10*6/uL — ABNORMAL LOW (ref 3.80–5.10)
RDW: 11.8 % (ref 11.0–15.0)
WBC: 2.9 10*3/uL — ABNORMAL LOW (ref 3.8–10.8)

## 2023-10-24 LAB — HEMOGLOBIN A1C
Hgb A1c MFr Bld: 5.1 % (ref <5.7)
Mean Plasma Glucose: 100 mg/dL
eAG (mmol/L): 5.5 mmol/L

## 2023-10-24 LAB — LIPID PANEL
Cholesterol: 164 mg/dL (ref <200)
HDL: 72 mg/dL (ref >=50)
LDL Cholesterol (Calc): 77 mg/dL
Non-HDL Cholesterol (Calc): 92 mg/dL (ref <130)
Total CHOL/HDL Ratio: 2.3 (calc) (ref <5.0)
Triglycerides: 66 mg/dL (ref <150)

## 2023-10-24 NOTE — Addendum Note (Signed)
 Addended by: Carollynn Cirri on: 10/24/2023 08:14 AM   Modules accepted: Orders

## 2023-10-27 ENCOUNTER — Inpatient Hospital Stay: Attending: Oncology | Admitting: Oncology

## 2023-10-27 ENCOUNTER — Encounter: Payer: Self-pay | Admitting: Oncology

## 2023-10-27 ENCOUNTER — Inpatient Hospital Stay

## 2023-10-27 VITALS — BP 144/83 | HR 72 | Temp 96.0°F | Resp 18 | Wt 183.1 lb

## 2023-10-27 DIAGNOSIS — D649 Anemia, unspecified: Secondary | ICD-10-CM | POA: Insufficient documentation

## 2023-10-27 DIAGNOSIS — D709 Neutropenia, unspecified: Secondary | ICD-10-CM | POA: Insufficient documentation

## 2023-10-27 DIAGNOSIS — D72819 Decreased white blood cell count, unspecified: Secondary | ICD-10-CM | POA: Insufficient documentation

## 2023-10-27 LAB — VITAMIN B12: Vitamin B-12: 400 pg/mL (ref 180–914)

## 2023-10-27 LAB — CBC WITH DIFFERENTIAL/PLATELET
Abs Immature Granulocytes: 0.01 10*3/uL (ref 0.00–0.07)
Basophils Absolute: 0 10*3/uL (ref 0.0–0.1)
Basophils Relative: 0 %
Eosinophils Absolute: 0 10*3/uL (ref 0.0–0.5)
Eosinophils Relative: 1 %
HCT: 33.9 % — ABNORMAL LOW (ref 36.0–46.0)
Hemoglobin: 11.5 g/dL — ABNORMAL LOW (ref 12.0–15.0)
Immature Granulocytes: 0 %
Lymphocytes Relative: 41 %
Lymphs Abs: 1.2 10*3/uL (ref 0.7–4.0)
MCH: 31.8 pg (ref 26.0–34.0)
MCHC: 33.9 g/dL (ref 30.0–36.0)
MCV: 93.6 fL (ref 80.0–100.0)
Monocytes Absolute: 0.3 10*3/uL (ref 0.1–1.0)
Monocytes Relative: 12 %
Neutro Abs: 1.3 10*3/uL — ABNORMAL LOW (ref 1.7–7.7)
Neutrophils Relative %: 46 %
Platelets: 162 10*3/uL (ref 150–400)
RBC: 3.62 MIL/uL — ABNORMAL LOW (ref 3.87–5.11)
RDW: 11.9 % (ref 11.5–15.5)
WBC: 2.9 10*3/uL — ABNORMAL LOW (ref 4.0–10.5)
nRBC: 0 % (ref 0.0–0.2)

## 2023-10-27 LAB — RETIC PANEL
Immature Retic Fract: 7.5 % (ref 2.3–15.9)
RBC.: 3.59 MIL/uL — ABNORMAL LOW (ref 3.87–5.11)
Retic Count, Absolute: 68.6 10*3/uL (ref 19.0–186.0)
Retic Ct Pct: 1.9 % (ref 0.4–3.1)
Reticulocyte Hemoglobin: 33.5 pg (ref >27.9)

## 2023-10-27 LAB — HEPATIC FUNCTION PANEL
ALT: 22 U/L (ref 0–44)
AST: 31 U/L (ref 15–41)
Albumin: 4.1 g/dL (ref 3.5–5.0)
Alkaline Phosphatase: 67 U/L (ref 38–126)
Bilirubin, Direct: 0.1 mg/dL (ref 0.0–0.2)
Indirect Bilirubin: 0.4 mg/dL (ref 0.3–0.9)
Total Bilirubin: 0.5 mg/dL (ref 0.0–1.2)
Total Protein: 7.4 g/dL (ref 6.5–8.1)

## 2023-10-27 LAB — FOLATE: Folate: 27 ng/mL (ref >5.9)

## 2023-10-27 LAB — LACTATE DEHYDROGENASE: LDH: 155 U/L (ref 98–192)

## 2023-10-27 LAB — HIV ANTIBODY (ROUTINE TESTING W REFLEX): HIV Screen 4th Generation wRfx: NONREACTIVE

## 2023-10-27 NOTE — Progress Notes (Signed)
 Hematology/Oncology Consult Note [phone] [phone]    REFERRING PROVIDER: Carollynn Cirri, NP   CHIEF COMPLAINTS/REASON FOR VISIT:  Evaluation of leukopenia   ASSESSMENT & PLAN:   Neutropenia (HCC) I discussed with patient that the differential diagnosis of leukopenia is broad, including acute or chronic infection, inflammation, nutrition deficiency, autoimmune disease,  ethnic, or malignant etiology including underlying bone morrow disorders.  For the work up of patient's leukoepenia, I recommend checking CBC;CMP, LDH; smear review, folate, Vitamin B12, hepatitis, HIV, flowcytometry and monoclonal gammopathy workup.    Normocytic anemia Pending above work up   Orders Placed This Encounter  Procedures   Folate    Standing Status:   Future    Number of Occurrences:   1    Expected Date:   10/27/2023    Expiration Date:   01/25/2024   Vitamin B12    Standing Status:   Future    Number of Occurrences:   1    Expected Date:   10/27/2023    Expiration Date:   01/25/2024   CBC with Differential/Platelet    Standing Status:   Future    Number of Occurrences:   1    Expected Date:   10/27/2023    Expiration Date:   01/25/2024   Retic Panel    Standing Status:   Future    Number of Occurrences:   1    Expected Date:   10/27/2023    Expiration Date:   01/25/2024   Flow cytometry panel-leukemia/lymphoma work-up    Standing Status:   Future    Number of Occurrences:   1    Expected Date:   10/27/2023    Expiration Date:   01/25/2024   Lactate dehydrogenase    Standing Status:   Future    Number of Occurrences:   1    Expected Date:   10/27/2023    Expiration Date:   01/25/2024   HIV Antibody (routine testing w rflx)    Standing Status:   Future    Number of Occurrences:   1    Expected Date:   10/27/2023    Expiration Date:   01/25/2024   Hepatic function panel    Standing Status:   Future    Number of Occurrences:   1    Expected Date:   10/27/2023     Expiration Date:   01/25/2024   Follow up in a few weeks All questions were answered. The patient knows to call the clinic with any problems, questions or concerns.  Timmy Forbes, MD, PhD [facility] Hematology Oncology 10/27/2023    HISTORY OF PRESENTING ILLNESS:  Tracey Carroll is a 83 y.o. female who was seen in consultation at the request of Carollynn Cirri, NP for evaluation of leukopenia Reviewed patient's recent labs. Patient has low total WBC count was , predominantly  Previous lab records reviewed. 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  Denies history hepatitis or HIV infection Denies history of chronic liver disease Denies routine alcohol consumption. Denies dietary restrictions.  Denies Herbal medication    MEDICAL HISTORY:  Past Medical History:  Diagnosis Date   Acid reflux 10/19/2014   Arthritis    arms   Back pain, chronic 10/19/2014   GERD (gastroesophageal reflux disease)    Hyperlipidemia    Hypertension    Insomnia, persistent 10/19/2014    SURGICAL HISTORY: Past Surgical History:  Procedure Laterality Date   ESOPHAGOGASTRODUODENOSCOPY (EGD) WITH PROPOFOL  N/A 11/20/2014   Procedure: ESOPHAGOGASTRODUODENOSCOPY (EGD) WITH PROPOFOL  with dilation;  Surgeon: Marnee Sink, MD;  Location: [facility];  Service: Endoscopy;  Laterality: N/A;   ESOPHAGOGASTRODUODENOSCOPY (EGD) WITH PROPOFOL  N/A 09/17/2019   Procedure: ESOPHAGOGASTRODUODENOSCOPY (EGD) WITH PROPOFOL ;  Surgeon: Irby Mannan, MD;  Location: [facility];  Service: Endoscopy;  Laterality: N/A;   TUBAL LIGATION      SOCIAL HISTORY: Social History   Socioeconomic History   Marital status: Single    Spouse name: Not on file   Number of children: Not on file   Years of education: Not on file   Highest education level: 12th grade  Occupational History   Occupation: retired  Tobacco Use    Smoking status: Never   Smokeless tobacco: Never  Vaping Use   Vaping status: Never Used  Substance and Sexual Activity   Alcohol use: No    Alcohol/week: 0.0 standard drinks of alcohol   Drug use: No   Sexual activity: Not Currently  Other Topics Concern   Not on file  Social History Narrative   ** Merged History Encounter **       Social Drivers of Health   Financial Resource Strain: Low Risk  (10/27/2023)   Overall Financial Resource Strain (CARDIA)    Difficulty of Paying Living Expenses: Not very hard  Food Insecurity: No Food Insecurity (10/27/2023)   Hunger Vital Sign    Worried About Running Out of Food in the Last Year: Never true    Ran Out of Food in the Last Year: Never true  Transportation Needs: No Transportation Needs (10/27/2023)   PRAPARE - Administrator, Civil Service (Medical): No    Lack of Transportation (Non-Medical): No  Physical Activity: Inactive (12/16/2022)   Exercise Vital Sign    Days of Exercise per Week: 0 days    Minutes of Exercise per Session: 0 min  Stress: No Stress Concern Present (10/27/2023)   [employer] of Occupational Health - Occupational Stress Questionnaire    Feeling of Stress: Only a little  Social Connections: Socially Isolated (12/16/2022)   Social Connection and Isolation Panel    Frequency of Communication with Friends and Family: Once a week    Frequency of Social Gatherings with Friends and Family: Once a week    Attends Religious Services: More than 4 times per year    Active Member of [employer] or Organizations: No    Attends Banker Meetings: Never    Marital Status: Widowed  Intimate Partner Violence: Not At Risk (10/27/2023)   Humiliation, Afraid, Rape, and Kick questionnaire    Fear of Current or Ex-Partner: No    Emotionally Abused: No    Physically Abused: No    Sexually Abused: No    FAMILY HISTORY: Family History  Problem Relation Age of Onset   Diabetes Father        in her 108 and  65s   Hypertension Daughter        her 80s   Kidney cancer Neg Hx    Bladder Cancer Neg Hx     ALLERGIES:  has no known allergies.  MEDICATIONS:  Current Outpatient Medications  Medication Sig Dispense Refill   aspirin  EC 81 MG tablet Take 81 mg by mouth daily. Swallow whole.     atorvastatin  (LIPITOR) 40 MG tablet TAKE 1 TABLET BY MOUTH DAILY 90 tablet 3   conjugated estrogens  (PREMARIN ) vaginal cream Apply  0.5mg  (pea-sized amount)  just inside the vaginal introitus with a finger-tip on  Monday, Wednesday and Friday nights. 30 g 5   hydrochlorothiazide  (HYDRODIURIL ) 12.5 MG tablet TAKE 1 TABLET BY MOUTH DAILY 90 tablet 1   losartan  (COZAAR ) 50 MG tablet TAKE 1 TABLET BY MOUTH DAILY 90 tablet 1   omeprazole  (PRILOSEC) 40 MG capsule Take 1 capsule (40 mg total) by mouth in the morning and at bedtime. 180 capsule 1   No current facility-administered medications for this visit.     Review of Systems  Constitutional:  Negative for appetite change, chills, fatigue and fever.  HENT:   Negative for hearing loss and voice change.   Eyes:  Negative for eye problems.  Respiratory:  Negative for chest tightness and cough.   Cardiovascular:  Negative for chest pain.  Gastrointestinal:  Negative for abdominal distention, abdominal pain and blood in stool.  Endocrine: Negative for hot flashes.  Genitourinary:  Negative for difficulty urinating and frequency.   Musculoskeletal:  Negative for arthralgias.  Skin:  Negative for itching and rash.  Neurological:  Negative for extremity weakness.  Hematological:  Negative for adenopathy.  Psychiatric/Behavioral:  Negative for confusion.     PHYSICAL EXAMINATION:  Vitals:   10/27/23 1115  BP: (!) 144/83  Pulse: 72  Resp: 18  Temp: (!) 96 F (35.6 C)  SpO2: 100%   Filed Weights   10/27/23 1115  Weight: 183 lb 1.6 oz (83.1 kg)     Physical Exam Constitutional:      General: She is not in acute distress.    Appearance: She is obese.   HENT:     Head: Normocephalic and atraumatic.   Eyes:     General: No scleral icterus.   Cardiovascular:     Rate and Rhythm: Normal rate and regular rhythm.     Heart sounds: Normal heart sounds.  Pulmonary:     Effort: Pulmonary effort is normal. No respiratory distress.     Breath sounds: No wheezing.  Abdominal:     General: Bowel sounds are normal. There is no distension.     Palpations: Abdomen is soft.   Musculoskeletal:        General: No deformity. Normal range of motion.     Cervical back: Normal range of motion and neck supple.   Skin:    General: Skin is warm and dry.     Findings: No erythema or rash.   Neurological:     Mental Status: She is alert and oriented to person, place, and time. Mental status is at baseline.   Psychiatric:        Mood and Affect: Mood normal.     RADIOGRAPHIC STUDIES: I have personally reviewed the radiological images as listed and agreed with the findings in the report. No results found.   LABORATORY DATA:  I have reviewed the data as listed    Latest Ref Rng & Units 10/27/2023   11:46 AM 10/23/2023    8:54 AM 04/24/2023    8:51 AM  CBC  WBC 4.0 - 10.5 K/uL 2.9  2.9  3.0   Hemoglobin 12.0 - 15.0 g/dL 24.4  01.0  27.2   Hematocrit 36.0 - 46.0 % 33.9  35.6  35.9   Platelets 150 - 400 K/uL 162  171  198       Latest Ref Rng & Units 10/27/2023   11:46 AM 10/23/2023    8:54 AM 04/24/2023    8:51 AM  CMP  Glucose 65 - 99 mg/dL  96  92   BUN 7 - 25 mg/dL  13  13   Creatinine 1.61 - 0.95 mg/dL  0.96  0.45   Sodium 409 - 146 mmol/L  138  139   Potassium 3.5 - 5.3 mmol/L  4.3  3.8   Chloride 98 - 110 mmol/L  101  101   CO2 20 - 32 mmol/L  29  30   Calcium  8.6 - 10.4 mg/dL  9.5  81.1   Total Protein 6.5 - 8.1 g/dL 7.4  7.3  7.4   Total Bilirubin 0.0 - 1.2 mg/dL 0.5  0.7  0.8   Alkaline Phos 38 - 126 U/L 67     AST 15 - 41 U/L 31  24  21    ALT 0 - 44 U/L 22  17  12         RADIOGRAPHIC STUDIES: I have personally reviewed  the radiological images as listed and agreed with the findings in the report. No results found.

## 2023-10-27 NOTE — Assessment & Plan Note (Signed)
I discussed with patient that the differential diagnosis of leukopenia is broad, including acute or chronic infection, inflammation, nutrition deficiency, autoimmune disease,  ethnic, or malignant etiology including underlying bone morrow disorders.  For the work up of patient's leukoepenia, I recommend checking CBC;CMP, LDH; smear review, folate, Vitamin B12, hepatitis, HIV, flowcytometry and monoclonal gammopathy workup.   

## 2023-10-27 NOTE — Assessment & Plan Note (Signed)
 Pending above work up

## 2023-11-02 LAB — COMP PANEL: LEUKEMIA/LYMPHOMA

## 2023-11-13 ENCOUNTER — Other Ambulatory Visit: Payer: Self-pay | Admitting: Internal Medicine

## 2023-11-13 DIAGNOSIS — Z0001 Encounter for general adult medical examination with abnormal findings: Secondary | ICD-10-CM

## 2023-11-14 NOTE — Telephone Encounter (Signed)
 Requested Prescriptions  Pending Prescriptions Disp Refills   hydrochlorothiazide  (HYDRODIURIL ) 12.5 MG tablet [Pharmacy Med Name: hydroCHLOROthiazide  12.5 MG Oral Tablet] 90 tablet 1    Sig: TAKE 1 TABLET BY MOUTH DAILY     Cardiovascular: Diuretics - Thiazide Failed - 11/14/2023  3:50 PM      Failed - Last BP in normal range    BP Readings from Last 1 Encounters:  10/27/23 (!) 144/83         Passed - Cr in normal range and within 180 days    Creat  Date Value Ref Range Status  10/23/2023 0.64 0.60 - 0.95 mg/dL Final         Passed - K in normal range and within 180 days    Potassium  Date Value Ref Range Status  10/23/2023 4.3 3.5 - 5.3 mmol/L Final         Passed - Na in normal range and within 180 days    Sodium  Date Value Ref Range Status  10/23/2023 138 135 - 146 mmol/L Final         Passed - Valid encounter within last 6 months    Recent Outpatient Visits           3 weeks ago Encounter for general adult medical examination with abnormal findings   [facility] [place], Angeline ORN, NP               losartan  (COZAAR ) 50 MG tablet [Pharmacy Med Name: Losartan  Potassium 50 MG Oral Tablet] 90 tablet 1    Sig: TAKE 1 TABLET BY MOUTH DAILY     Cardiovascular:  Angiotensin Receptor Blockers Failed - 11/14/2023  3:50 PM      Failed - Last BP in normal range    BP Readings from Last 1 Encounters:  10/27/23 (!) 144/83         Passed - Cr in normal range and within 180 days    Creat  Date Value Ref Range Status  10/23/2023 0.64 0.60 - 0.95 mg/dL Final         Passed - K in normal range and within 180 days    Potassium  Date Value Ref Range Status  10/23/2023 4.3 3.5 - 5.3 mmol/L Final         Passed - Patient is not pregnant      Passed - Valid encounter within last 6 months    Recent Outpatient Visits           3 weeks ago Encounter for general adult medical examination with abnormal findings   [facility] [place], Angeline ORN, NP

## 2023-11-22 ENCOUNTER — Encounter: Payer: Self-pay | Admitting: Oncology

## 2023-11-22 ENCOUNTER — Inpatient Hospital Stay: Attending: Oncology | Admitting: Oncology

## 2023-11-22 VITALS — BP 114/67 | HR 74 | Temp 97.5°F | Resp 16 | Ht 59.0 in | Wt 183.0 lb

## 2023-11-22 DIAGNOSIS — D72819 Decreased white blood cell count, unspecified: Secondary | ICD-10-CM | POA: Diagnosis present

## 2023-11-22 DIAGNOSIS — D709 Neutropenia, unspecified: Secondary | ICD-10-CM | POA: Diagnosis not present

## 2023-11-22 DIAGNOSIS — D649 Anemia, unspecified: Secondary | ICD-10-CM | POA: Insufficient documentation

## 2023-11-22 NOTE — Progress Notes (Signed)
 Patient has been doing ok, no new questions for the doctor today.

## 2023-11-22 NOTE — Assessment & Plan Note (Addendum)
 Workup results were reviewed with patient. Negative hepatitis, HIV, negative peripheral blood flow cytometry.  Normal B12 and folate. 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  She is asymptomatic. Continue observation.

## 2023-11-22 NOTE — Progress Notes (Signed)
 Hematology/Oncology Progress note [phone] [phone]      REFERRING PROVIDER: Antonette Angeline ORN, NP   CHIEF COMPLAINTS/REASON FOR VISIT:  Leukopenia and anemia   ASSESSMENT & PLAN:   Neutropenia (HCC) Workup results were reviewed with patient. Negative hepatitis, HIV, negative peripheral blood flow cytometry.  Normal B12 and folate. 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  She is asymptomatic. Continue observation.  Normocytic anemia Normal B12 and folate.  Check iron, TIBC ferritin, SPEP.SABRA  Anemia is mild.   Orders Placed This Encounter  Procedures   CBC with Differential (Cancer Center Only)    Standing Status:   Future    Expected Date:   05/24/2024    Expiration Date:   08/22/2024   Vitamin B12    Standing Status:   Future    Expected Date:   05/24/2024    Expiration Date:   08/22/2024   Follow up 6 months All questions were answered. The patient knows to call the clinic with any problems, questions or concerns.  Zelphia Cap, MD, PhD [facility] Hematology Oncology 11/22/2023    HISTORY OF PRESENTING ILLNESS:  Tracey Carroll is a 83 y.o. female who was seen in consultation at the request of Antonette Angeline ORN, NP for evaluation of leukopenia Reviewed patient's recent labs. Patient has low total WBC count was , predominantly  Previous lab records reviewed. 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  Denies history hepatitis or HIV infection Denies history of chronic liver disease Denies routine alcohol consumption. Denies dietary restrictions.  Denies Herbal medication    MEDICAL HISTORY:  Past Medical History:  Diagnosis Date   Acid reflux 10/19/2014   Arthritis    arms   Back pain, chronic 10/19/2014   GERD (gastroesophageal reflux disease)    Hyperlipidemia    Hypertension    Insomnia, persistent 10/19/2014    SURGICAL HISTORY: Past Surgical History:   Procedure Laterality Date   ESOPHAGOGASTRODUODENOSCOPY (EGD) WITH PROPOFOL  N/A 11/20/2014   Procedure: ESOPHAGOGASTRODUODENOSCOPY (EGD) WITH PROPOFOL  with dilation;  Surgeon: Rogelia Copping, MD;  Location: [facility];  Service: Endoscopy;  Laterality: N/A;   ESOPHAGOGASTRODUODENOSCOPY (EGD) WITH PROPOFOL  N/A 09/17/2019   Procedure: ESOPHAGOGASTRODUODENOSCOPY (EGD) WITH PROPOFOL ;  Surgeon: Janalyn Keene NOVAK, MD;  Location: [facility];  Service: Endoscopy;  Laterality: N/A;   TUBAL LIGATION      SOCIAL HISTORY: Social History   Socioeconomic History   Marital status: Single    Spouse name: Not on file   Number of children: Not on file   Years of education: Not on file   Highest education level: 12th grade  Occupational History   Occupation: retired  Tobacco Use   Smoking status: Never   Smokeless tobacco: Never  Vaping Use   Vaping status: Never Used  Substance and Sexual Activity   Alcohol use: No    Alcohol/week: 0.0 standard drinks of alcohol   Drug use: No   Sexual activity: Not Currently  Other Topics Concern   Not on file  Social History Narrative   ** Merged History Encounter **       Social Drivers of Health   Financial Resource Strain: Low Risk  (10/27/2023)   Overall Financial Resource Strain (CARDIA)    Difficulty of Paying Living Expenses: Not very hard  Food Insecurity: No Food Insecurity (10/27/2023)   Hunger Vital Sign    Worried About Running Out of Food in the Last Year: Never true  Ran Out of Food in the Last Year: Never true  Transportation Needs: No Transportation Needs (10/27/2023)   PRAPARE - Administrator, Civil Service (Medical): No    Lack of Transportation (Non-Medical): No  Physical Activity: Inactive (12/16/2022)   Exercise Vital Sign    Days of Exercise per Week: 0 days    Minutes of Exercise per Session: 0 min  Stress: No Stress Concern Present (10/27/2023)   [employer] of Occupational Health - Occupational  Stress Questionnaire    Feeling of Stress: Only a little  Social Connections: Socially Isolated (12/16/2022)   Social Connection and Isolation Panel    Frequency of Communication with Friends and Family: Once a week    Frequency of Social Gatherings with Friends and Family: Once a week    Attends Religious Services: More than 4 times per year    Active Member of [employer] or Organizations: No    Attends Banker Meetings: Never    Marital Status: Widowed  Intimate Partner Violence: Not At Risk (10/27/2023)   Humiliation, Afraid, Rape, and Kick questionnaire    Fear of Current or Ex-Partner: No    Emotionally Abused: No    Physically Abused: No    Sexually Abused: No    FAMILY HISTORY: Family History  Problem Relation Age of Onset   Diabetes Father        in her 74 and 71s   Hypertension Daughter        her 87s   Kidney cancer Neg Hx    Bladder Cancer Neg Hx     ALLERGIES:  has no known allergies.  MEDICATIONS:  Current Outpatient Medications  Medication Sig Dispense Refill   aspirin  EC 81 MG tablet Take 81 mg by mouth daily. Swallow whole.     atorvastatin  (LIPITOR) 40 MG tablet TAKE 1 TABLET BY MOUTH DAILY 90 tablet 3   conjugated estrogens  (PREMARIN ) vaginal cream Apply 0.5mg  (pea-sized amount)  just inside the vaginal introitus with a finger-tip on  Monday, Wednesday and Friday nights. 30 g 5   hydrochlorothiazide  (HYDRODIURIL ) 12.5 MG tablet TAKE 1 TABLET BY MOUTH DAILY 90 tablet 1   losartan  (COZAAR ) 50 MG tablet TAKE 1 TABLET BY MOUTH DAILY 90 tablet 1   omeprazole  (PRILOSEC) 40 MG capsule Take 1 capsule (40 mg total) by mouth in the morning and at bedtime. 180 capsule 1   No current facility-administered medications for this visit.     Review of Systems  Constitutional:  Negative for appetite change, chills, fatigue and fever.  HENT:   Negative for hearing loss and voice change.   Eyes:  Negative for eye problems.  Respiratory:  Negative for chest  tightness and cough.   Cardiovascular:  Negative for chest pain.  Gastrointestinal:  Negative for abdominal distention, abdominal pain and blood in stool.  Endocrine: Negative for hot flashes.  Genitourinary:  Negative for difficulty urinating and frequency.   Musculoskeletal:  Negative for arthralgias.  Skin:  Negative for itching and rash.  Neurological:  Negative for extremity weakness.  Hematological:  Negative for adenopathy.  Psychiatric/Behavioral:  Negative for confusion.     PHYSICAL EXAMINATION:  Vitals:   11/22/23 1428  BP: 114/67  Pulse: 74  Resp: 16  Temp: (!) 97.5 F (36.4 C)  SpO2: 98%   Filed Weights   11/22/23 1428  Weight: 183 lb (83 kg)     Physical Exam Constitutional:      General: She is not in acute  distress.    Appearance: She is obese.  HENT:     Head: Normocephalic and atraumatic.  Eyes:     General: No scleral icterus. Cardiovascular:     Rate and Rhythm: Normal rate and regular rhythm.     Heart sounds: Normal heart sounds.  Pulmonary:     Effort: Pulmonary effort is normal. No respiratory distress.     Breath sounds: No wheezing.  Abdominal:     General: Bowel sounds are normal. There is no distension.     Palpations: Abdomen is soft.  Musculoskeletal:        General: No deformity. Normal range of motion.     Cervical back: Normal range of motion and neck supple.  Skin:    General: Skin is warm and dry.     Findings: No erythema or rash.  Neurological:     Mental Status: She is alert and oriented to person, place, and time. Mental status is at baseline.  Psychiatric:        Mood and Affect: Mood normal.     RADIOGRAPHIC STUDIES: I have personally reviewed the radiological images as listed and agreed with the findings in the report. No results found.   LABORATORY DATA:  I have reviewed the data as listed    Latest Ref Rng & Units 10/27/2023   11:46 AM 10/23/2023    8:54 AM 04/24/2023    8:51 AM  CBC  WBC 4.0 - 10.5 K/uL  2.9  2.9  3.0   Hemoglobin 12.0 - 15.0 g/dL 88.4  88.1  87.8   Hematocrit 36.0 - 46.0 % 33.9  35.6  35.9   Platelets 150 - 400 K/uL 162  171  198       Latest Ref Rng & Units 10/27/2023   11:46 AM 10/23/2023    8:54 AM 04/24/2023    8:51 AM  CMP  Glucose 65 - 99 mg/dL  96  92   BUN 7 - 25 mg/dL  13  13   Creatinine 9.39 - 0.95 mg/dL  9.35  9.19   Sodium 864 - 146 mmol/L  138  139   Potassium 3.5 - 5.3 mmol/L  4.3  3.8   Chloride 98 - 110 mmol/L  101  101   CO2 20 - 32 mmol/L  29  30   Calcium  8.6 - 10.4 mg/dL  9.5  89.9   Total Protein 6.5 - 8.1 g/dL 7.4  7.3  7.4   Total Bilirubin 0.0 - 1.2 mg/dL 0.5  0.7  0.8   Alkaline Phos 38 - 126 U/L 67     AST 15 - 41 U/L 31  24  21    ALT 0 - 44 U/L 22  17  12         RADIOGRAPHIC STUDIES: I have personally reviewed the radiological images as listed and agreed with the findings in the report. No results found.

## 2023-11-22 NOTE — Assessment & Plan Note (Signed)
 Normal B12 and folate.  Check iron, TIBC ferritin, SPEP.SABRA  Anemia is mild.

## 2023-12-01 ENCOUNTER — Other Ambulatory Visit: Payer: Self-pay | Admitting: Internal Medicine

## 2023-12-04 NOTE — Telephone Encounter (Signed)
 Requested Prescriptions  Refused Prescriptions Disp Refills   atorvastatin  (LIPITOR) 40 MG tablet [Pharmacy Med Name: Atorvastatin  Calcium  40 MG Oral Tablet] 100 tablet 2    Sig: TAKE 1 TABLET BY MOUTH DAILY     Cardiovascular:  Antilipid - Statins Failed - 12/04/2023  4:40 PM      Failed - Lipid Panel in normal range within the last 12 months    Cholesterol  Date Value Ref Range Status  10/23/2023 164 <200 mg/dL Final   LDL Cholesterol (Calc)  Date Value Ref Range Status  10/23/2023 77 mg/dL (calc) Final    Comment:    Reference range: <100 . Desirable range <100 mg/dL for primary prevention;   <70 mg/dL for patients with CHD or diabetic patients  with > or = 2 CHD risk factors. SABRA LDL-C is now calculated using the Martin-Hopkins  calculation, which is a validated novel method providing  better accuracy than the Friedewald equation in the  estimation of LDL-C.  Gladis APPLETHWAITE et al. SANDREA. 7986;689(80): 2061-2068  (http://education.[employer].com/faq/FAQ164)    HDL  Date Value Ref Range Status  10/23/2023 72 > OR = 50 mg/dL Final   Triglycerides  Date Value Ref Range Status  10/23/2023 66 <150 mg/dL Final         Passed - Patient is not pregnant      Passed - Valid encounter within last 12 months    Recent Outpatient Visits           1 month ago Encounter for general adult medical examination with abnormal findings    [facility] [place], Angeline ORN, NP

## 2023-12-21 ENCOUNTER — Ambulatory Visit: Payer: 59

## 2023-12-21 VITALS — Ht 59.0 in | Wt 183.0 lb

## 2023-12-21 DIAGNOSIS — Z0000 Encounter for general adult medical examination without abnormal findings: Secondary | ICD-10-CM | POA: Diagnosis not present

## 2023-12-21 NOTE — Patient Instructions (Addendum)
 Tracey Carroll , Thank you for taking time out of your busy schedule to complete your Annual Wellness Visit with me. I enjoyed our conversation and look forward to speaking with you again next year. I, as well as your care team,  appreciate your ongoing commitment to your health goals. Please review the following plan we discussed and let me know if I can assist you in the future. Your Game plan/ To Do List    Referrals: If you haven't heard from the office you've been referred to, please reach out to them at the phone provided.   Follow up Visits: We will see or speak with you next year for your Next Medicare AWV with our clinical staff 01/03/25 @ 10:10a Have you seen your provider in the last 6 months (3 months if uncontrolled diabetes)?   Clinician Recommendations:  Aim for 30 minutes of exercise or brisk walking, 6-8 glasses of water, and 5 servings of fruits and vegetables each day.       This is a list of the screenings recommended for you:  Health Maintenance  Topic Date Due   Flu Shot  12/15/2023   Zoster (Shingles) Vaccine (1 of 2) 01/23/2024*   DTaP/Tdap/Td vaccine (3 - Td or Tdap) 10/22/2024*   Medicare Annual Wellness Visit  12/20/2024   Pneumococcal Vaccine for age over 59  Completed   DEXA scan (bone density measurement)  Completed   Hepatitis B Vaccine  Aged Out   HPV Vaccine  Aged Out   Meningitis B Vaccine  Aged Out   Colon Cancer Screening  Discontinued   COVID-19 Vaccine  Discontinued   Hepatitis C Screening  Discontinued  *Topic was postponed. The date shown is not the original due date.    Advanced directives: (Declined) Advance directive discussed with you today. Even though you declined this today, please call our office should you change your mind, and we can give you the proper paperwork for you to fill out. Advance Care Planning is important because it:  [x]  Makes sure you receive the medical care that is consistent with your values, goals, and  preferences  [x]  It provides guidance to your family and loved ones and reduces their decisional burden about whether or not they are making the right decisions based on your wishes.  Follow the link provided in your after visit summary or read over the paperwork we have mailed to you to help you started getting your Advance Directives in place. If you need assistance in completing these, please reach out to us  so that we can help you!  See attachments for Preventive Care and Fall Prevention Tips.

## 2023-12-21 NOTE — Progress Notes (Signed)
 Subjective:   Tracey Carroll is a 83 y.o. who presents for a Medicare Wellness preventive visit.  As a reminder, Annual Wellness Visits don't include a physical exam, and some assessments may be limited, especially if this visit is performed virtually. We may recommend an in-person follow-up visit with your provider if needed.  Visit Complete: Virtual I connected with  Tracey Carroll on 12/21/23 by a audio enabled telemedicine application and verified that I am speaking with the correct person using two identifiers.  Patient Location: Home  Provider Location: Home Office  I discussed the limitations of evaluation and management by telemedicine. The patient expressed understanding and agreed to proceed.  Vital Signs: Because this visit was a virtual/telehealth visit, some criteria may be missing or patient reported. Any vitals not documented were not able to be obtained and vitals that have been documented are patient reported.    Persons Participating in Visit: Patient.  AWV Questionnaire: No: Patient Medicare AWV questionnaire was not completed prior to this visit.  Cardiac Risk Factors include: advanced age (>32men, >35 women);hypertension     Objective:    Today's Vitals   12/21/23 1019  Weight: 183 lb (83 kg)  Height: 4' 11 (1.499 m)   Body mass index is 36.96 kg/m.     12/21/2023   10:23 AM 11/22/2023    2:26 PM 10/27/2023   11:20 AM 12/16/2022   11:01 AM 11/05/2021    8:50 AM 10/27/2020    8:20 AM 10/22/2019    8:24 AM  Advanced Directives  Does Patient Have a Medical Advance Directive? No No No No No No No  Would patient like information on creating a medical advance directive? No - Patient declined  No - Patient declined No - Patient declined No - Patient declined  No - Patient declined    Current Medications (verified) Outpatient Encounter Medications as of 12/21/2023  Medication Sig   aspirin  EC 81 MG tablet Take 81 mg by mouth daily. Swallow whole.    atorvastatin  (LIPITOR) 40 MG tablet TAKE 1 TABLET BY MOUTH DAILY   conjugated estrogens  (PREMARIN ) vaginal cream Apply 0.5mg  (pea-sized amount)  just inside the vaginal introitus with a finger-tip on  Monday, Wednesday and Friday nights.   hydrochlorothiazide  (HYDRODIURIL ) 12.5 MG tablet TAKE 1 TABLET BY MOUTH DAILY   losartan  (COZAAR ) 50 MG tablet TAKE 1 TABLET BY MOUTH DAILY   omeprazole  (PRILOSEC) 40 MG capsule Take 1 capsule (40 mg total) by mouth in the morning and at bedtime.   No facility-administered encounter medications on file as of 12/21/2023.    Allergies (verified) Patient has no known allergies.   History: Past Medical History:  Diagnosis Date   Acid reflux 10/19/2014   Arthritis    arms   Back pain, chronic 10/19/2014   GERD (gastroesophageal reflux disease)    Hyperlipidemia    Hypertension    Insomnia, persistent 10/19/2014   Past Surgical History:  Procedure Laterality Date   ESOPHAGOGASTRODUODENOSCOPY (EGD) WITH PROPOFOL  N/A 11/20/2014   Procedure: ESOPHAGOGASTRODUODENOSCOPY (EGD) WITH PROPOFOL  with dilation;  Surgeon: Rogelia Copping, MD;  Location: [facility];  Service: Endoscopy;  Laterality: N/A;   ESOPHAGOGASTRODUODENOSCOPY (EGD) WITH PROPOFOL  N/A 09/17/2019   Procedure: ESOPHAGOGASTRODUODENOSCOPY (EGD) WITH PROPOFOL ;  Surgeon: Janalyn Keene NOVAK, MD;  Location: [facility];  Service: Endoscopy;  Laterality: N/A;   TUBAL LIGATION     Family History  Problem Relation Age of Onset   Diabetes Father  in her 82 and 3s   Hypertension Daughter        her 34s   Kidney cancer Neg Hx    Bladder Cancer Neg Hx    Social History   Socioeconomic History   Marital status: Single    Spouse name: Not on file   Number of children: Not on file   Years of education: Not on file   Highest education level: 12th grade  Occupational History   Occupation: retired  Tobacco Use   Smoking status: Never   Smokeless tobacco: Never  Vaping Use   Vaping  status: Never Used  Substance and Sexual Activity   Alcohol use: No    Alcohol/week: 0.0 standard drinks of alcohol   Drug use: No   Sexual activity: Not Currently  Other Topics Concern   Not on file  Social History Narrative   ** Merged History Encounter **       Social Drivers of Health   Financial Resource Strain: Low Risk  (12/21/2023)   Overall Financial Resource Strain (CARDIA)    Difficulty of Paying Living Expenses: Not hard at all  Food Insecurity: No Food Insecurity (12/21/2023)   Hunger Vital Sign    Worried About Running Out of Food in the Last Year: Never true    Ran Out of Food in the Last Year: Never true  Transportation Needs: No Transportation Needs (12/21/2023)   PRAPARE - Administrator, Civil Service (Medical): No    Lack of Transportation (Non-Medical): No  Physical Activity: Inactive (12/21/2023)   Exercise Vital Sign    Days of Exercise per Week: 0 days    Minutes of Exercise per Session: 0 min  Stress: No Stress Concern Present (12/21/2023)   [employer] of Occupational Health - Occupational Stress Questionnaire    Feeling of Stress: Not at all  Social Connections: Moderately Integrated (12/21/2023)   Social Connection and Isolation Panel    Frequency of Communication with Friends and Family: More than three times a week    Frequency of Social Gatherings with Friends and Family: More than three times a week    Attends Religious Services: More than 4 times per year    Active Member of [employer] or Organizations: Yes    Attends Banker Meetings: More than 4 times per year    Marital Status: Widowed    Tobacco Counseling Counseling given: Not Answered    Clinical Intake:  Pre-visit preparation completed: Yes  Pain : No/denies pain     BMI - recorded: 36.96 Nutritional Status: BMI > 30  Obese Nutritional Risks: None Diabetes: No  Lab Results  Component Value Date   HGBA1C 5.1 10/23/2023   HGBA1C 5.1 04/24/2023   HGBA1C  5.2 10/21/2022     How often do you need to have someone help you when you read instructions, pamphlets, or other written materials from your doctor or pharmacy?: 1 - Never  Interpreter Needed?: No  Information entered by :: Rojelio Blush LPN   Activities of Daily Living     12/21/2023   10:22 AM  In your present state of health, do you have any difficulty performing the following activities:  Hearing? 0  Vision? 0  Difficulty concentrating or making decisions? 0  Walking or climbing stairs? 0  Dressing or bathing? 0  Doing errands, shopping? 0  Preparing Food and eating ? N  Using the Toilet? N  In the past six months, have you accidently leaked urine?  N  Do you have problems with loss of bowel control? N  Managing your Medications? N  Managing your Finances? N  Housekeeping or managing your Housekeeping? N    Patient Care Team: Antonette Angeline ORN, NP as PCP - General (Internal Medicine) Babara Call, MD as Consulting Physician (Hematology and Oncology)  I have updated your Care Teams any recent Medical Services you may have received from other providers in the past year.     Assessment:   This is a routine wellness examination for Tracey Carroll.  Hearing/Vision screen Hearing Screening - Comments:: Denies hearing difficulties   Vision Screening - Comments:: Wears rx glasses - up to date with routine eye exams with  [facility]   Goals Addressed               This Visit's Progress     Increase physical activity (pt-stated)        Remain Active.       Depression Screen     12/21/2023   10:22 AM 10/27/2023   11:26 AM 10/23/2023    8:41 AM 12/16/2022   10:59 AM 10/21/2022   10:17 AM 12/06/2021    9:49 AM 11/05/2021    8:49 AM  PHQ 2/9 Scores  PHQ - 2 Score 0 0 0 0 0 4 0  PHQ- 9 Score 0 1 0 0  4 0    Fall Risk     12/21/2023   10:23 AM 10/23/2023    8:41 AM 12/16/2022   11:01 AM 10/21/2022   10:17 AM 11/05/2021    8:51 AM  Fall Risk   Falls in the past year? 1 0 0 0  0  Number falls in past yr: 0  0  0  Injury with Fall? 0  0 0 0  Risk for fall due to : No Fall Risks  No Fall Risks No Fall Risks No Fall Risks  Follow up Falls evaluation completed  Falls prevention discussed;Falls evaluation completed  Falls evaluation completed      Data saved with a previous flowsheet row definition    MEDICARE RISK AT HOME:  Medicare Risk at Home Any stairs in or around the home?: No If so, are there any without handrails?: No Home free of loose throw rugs in walkways, pet beds, electrical cords, etc?: Yes Adequate lighting in your home to reduce risk of falls?: Yes Life alert?: No Use of a cane, walker or w/c?: No Grab bars in the bathroom?: No Shower chair or bench in shower?: No Elevated toilet seat or a handicapped toilet?: No  TIMED UP AND GO:  Was the test performed?  No  Cognitive Function: 6CIT completed        12/21/2023   10:23 AM 12/16/2022   11:02 AM 10/27/2020    8:22 AM 10/22/2019    8:28 AM 03/06/2018   11:05 AM  6CIT Screen  What Year? 0 points 0 points 0 points 0 points 0 points  What month? 0 points 0 points 0 points 0 points 0 points  What time? 0 points 0 points 0 points 0 points 0 points  Count back from 20 0 points 0 points 0 points 0 points 0 points  Months in reverse 2 points 4 points 0 points 4 points 0 points  Repeat phrase 2 points 0 points 8 points 2 points 0 points  Total Score 4 points 4 points 8 points 6 points 0 points    Immunizations Immunization History  Administered  Date(s) Administered   Fluad Quad(high Dose 65+) 06/07/2021   Influenza, High Dose Seasonal PF 01/22/2015, 03/21/2016, 01/17/2017, 03/06/2018, 02/12/2019   Influenza-Unspecified 04/14/2014, 04/14/2014   Moderna Sars-Covid-2 Vaccination 12/30/2019, 01/28/2020   PNEUMOCOCCAL CONJUGATE-20 10/21/2022   Pneumococcal Conjugate-13 12/18/2013   Pneumococcal Polysaccharide-23 04/16/2012, 12/19/2014   Tdap 08/08/2013, 08/08/2013    Screening Tests Health  Maintenance  Topic Date Due   INFLUENZA VACCINE  12/15/2023   Zoster Vaccines- Shingrix (1 of 2) 01/23/2024 (Originally 03/31/1993)   DTaP/Tdap/Td (3 - Td or Tdap) 10/22/2024 (Originally 08/09/2023)   Medicare Annual Wellness (AWV)  12/20/2024   Pneumococcal Vaccine: 50+ Years  Completed   DEXA SCAN  Completed   Hepatitis B Vaccines  Aged Out   HPV VACCINES  Aged Out   Meningococcal B Vaccine  Aged Out   Colonoscopy  Discontinued   COVID-19 Vaccine  Discontinued   Hepatitis C Screening  Discontinued    Health Maintenance  Health Maintenance Due  Topic Date Due   INFLUENZA VACCINE  12/15/2023   Health Maintenance Items Addressed:   Additional Screening:  Vision Screening: Recommended annual ophthalmology exams for early detection of glaucoma and other disorders of the eye. Would you like a referral to an eye doctor? No    Dental Screening: Recommended annual dental exams for proper oral hygiene  Community Resource Referral / Chronic Care Management: CRR required this visit?  No   CCM required this visit?  No   Plan:    I have personally reviewed and noted the following in the patient's chart:   Medical and social history Use of alcohol, tobacco or illicit drugs  Current medications and supplements including opioid prescriptions. Patient is not currently taking opioid prescriptions. Functional ability and status Nutritional status Physical activity Advanced directives List of other physicians Hospitalizations, surgeries, and ER visits in previous 12 months Vitals Screenings to include cognitive, depression, and falls Referrals and appointments  In addition, I have reviewed and discussed with patient certain preventive protocols, quality metrics, and best practice recommendations. A written personalized care plan for preventive services as well as general preventive health recommendations were provided to patient.   Rojelio LELON Blush, LPN   05/17/7972   After  Visit Summary: (MyChart) Due to this being a telephonic visit, the after visit summary with patients personalized plan was offered to patient via MyChart   Notes: Nothing significant to report at this time.

## 2023-12-27 ENCOUNTER — Other Ambulatory Visit: Payer: Self-pay | Admitting: Internal Medicine

## 2023-12-27 DIAGNOSIS — K219 Gastro-esophageal reflux disease without esophagitis: Secondary | ICD-10-CM

## 2023-12-29 NOTE — Telephone Encounter (Signed)
 Requested by interface surescripts. Future visit in 04/25/24. Requested Prescriptions  Pending Prescriptions Disp Refills   omeprazole  (PRILOSEC) 40 MG capsule [Pharmacy Med Name: Omeprazole  40 MG Oral Capsule Delayed Release] 200 capsule 0    Sig: TAKE 1 CAPSULE BY MOUTH IN THE  MORNING AND AT BEDTIME     Gastroenterology: Proton Pump Inhibitors Passed - 12/29/2023  4:26 PM      Passed - Valid encounter within last 12 months    Recent Outpatient Visits           2 months ago Encounter for general adult medical examination with abnormal findings   [place] [facility] [place], Angeline ORN, NP

## 2024-01-14 ENCOUNTER — Other Ambulatory Visit: Payer: Self-pay | Admitting: Internal Medicine

## 2024-01-16 NOTE — Telephone Encounter (Signed)
 Requested Prescriptions  Pending Prescriptions Disp Refills   atorvastatin  (LIPITOR) 40 MG tablet [Pharmacy Med Name: Atorvastatin  Calcium  40 MG Oral Tablet] 90 tablet 1    Sig: TAKE 1 TABLET BY MOUTH DAILY     Cardiovascular:  Antilipid - Statins Failed - 01/16/2024  1:40 PM      Failed - Lipid Panel in normal range within the last 12 months    Cholesterol  Date Value Ref Range Status  10/23/2023 164 <200 mg/dL Final   LDL Cholesterol (Calc)  Date Value Ref Range Status  10/23/2023 77 mg/dL (calc) Final    Comment:    Reference range: <100 . Desirable range <100 mg/dL for primary prevention;   <70 mg/dL for patients with CHD or diabetic patients  with > or = 2 CHD risk factors. SABRA LDL-C is now calculated using the Martin-Hopkins  calculation, which is a validated novel method providing  better accuracy than the Friedewald equation in the  estimation of LDL-C.  Gladis APPLETHWAITE et al. SANDREA. 7986;689(80): 2061-2068  (http://education.[facility].com/faq/FAQ164)    HDL  Date Value Ref Range Status  10/23/2023 72 > OR = 50 mg/dL Final   Triglycerides  Date Value Ref Range Status  10/23/2023 66 <150 mg/dL Final         Passed - Patient is not pregnant      Passed - Valid encounter within last 12 months    Recent Outpatient Visits           2 months ago Encounter for general adult medical examination with abnormal findings   [place] [facility] [place], Angeline ORN, NP

## 2024-02-11 ENCOUNTER — Other Ambulatory Visit: Payer: Self-pay | Admitting: Internal Medicine

## 2024-02-11 DIAGNOSIS — Z0001 Encounter for general adult medical examination with abnormal findings: Secondary | ICD-10-CM

## 2024-02-13 NOTE — Telephone Encounter (Signed)
 Too soon for refill.  Requested Prescriptions  Pending Prescriptions Disp Refills   hydrochlorothiazide  (HYDRODIURIL ) 12.5 MG tablet [Pharmacy Med Name: hydroCHLOROthiazide  12.5 MG Oral Tablet] 100 tablet 2    Sig: TAKE 1 TABLET BY MOUTH DAILY     Cardiovascular: Diuretics - Thiazide Passed - 02/13/2024  3:41 PM      Passed - Cr in normal range and within 180 days    Creat  Date Value Ref Range Status  10/23/2023 0.64 0.60 - 0.95 mg/dL Final         Passed - K in normal range and within 180 days    Potassium  Date Value Ref Range Status  10/23/2023 4.3 3.5 - 5.3 mmol/L Final         Passed - Na in normal range and within 180 days    Sodium  Date Value Ref Range Status  10/23/2023 138 135 - 146 mmol/L Final         Passed - Last BP in normal range    BP Readings from Last 1 Encounters:  11/22/23 114/67         Passed - Valid encounter within last 6 months    Recent Outpatient Visits           3 months ago Encounter for general adult medical examination with abnormal findings   [place] [facility] [place], Angeline ORN, NP               losartan  (COZAAR ) 50 MG tablet [Pharmacy Med Name: Losartan  Potassium 50 MG Oral Tablet] 100 tablet 2    Sig: TAKE 1 TABLET BY MOUTH DAILY     Cardiovascular:  Angiotensin Receptor Blockers Passed - 02/13/2024  3:41 PM      Passed - Cr in normal range and within 180 days    Creat  Date Value Ref Range Status  10/23/2023 0.64 0.60 - 0.95 mg/dL Final         Passed - K in normal range and within 180 days    Potassium  Date Value Ref Range Status  10/23/2023 4.3 3.5 - 5.3 mmol/L Final         Passed - Patient is not pregnant      Passed - Last BP in normal range    BP Readings from Last 1 Encounters:  11/22/23 114/67         Passed - Valid encounter within last 6 months    Recent Outpatient Visits           3 months ago Encounter for general adult medical examination with abnormal findings   [place]  [address] [facility] [place], Angeline ORN, NP

## 2024-03-31 ENCOUNTER — Other Ambulatory Visit: Payer: Self-pay | Admitting: Internal Medicine

## 2024-03-31 DIAGNOSIS — K219 Gastro-esophageal reflux disease without esophagitis: Secondary | ICD-10-CM

## 2024-04-02 NOTE — Telephone Encounter (Signed)
 Requested Prescriptions  Refused Prescriptions Disp Refills   omeprazole  (PRILOSEC) 40 MG capsule [Pharmacy Med Name: Omeprazole  40 MG Oral Capsule Delayed Release] 200 capsule 2    Sig: TAKE 1 CAPSULE BY MOUTH IN THE  MORNING AND AT BEDTIME     Gastroenterology: Proton Pump Inhibitors Passed - 04/02/2024  4:12 PM      Passed - Valid encounter within last 12 months    Recent Outpatient Visits           5 months ago Encounter for general adult medical examination with abnormal findings   [place] [facility] [place], Angeline ORN, NP

## 2024-04-24 ENCOUNTER — Other Ambulatory Visit: Payer: Self-pay | Admitting: Internal Medicine

## 2024-04-24 DIAGNOSIS — Z0001 Encounter for general adult medical examination with abnormal findings: Secondary | ICD-10-CM

## 2024-04-24 NOTE — Progress Notes (Signed)
 Subjective:    Patient ID: Tracey Carroll, female    DOB: 13-Apr-1941, 83 y.o.   MRN: [id number]  HPI  Patient presents to clinic today for 48-month follow-up of chronic conditions.  HTN: Her BP today is 118/74.  She is taking hctz and losartan  as prescribed.  ECG from 02/2017 reviewed.  OA: Mainly in her back and knees. She does feel like her left leg has been worse lately with neuropathic pain. She denies back pain. She is not currently taking any medication for this.  She does not follow with orthopedics.  HLD with aortic atherosclerosis: Her last LDL was 77, triglycerides 66, 10/2023. She denies myalgias on atorvastatin .  She is taking aspirin  as prescribed.  She tries to consume a low-fat diet.  GERD: She is not sure what triggers this. She denies breakthrough on omeprazole .  Upper GI from 09/2019 reviewed.  Insomnia: She has difficulty falling and staying asleep.  She does not take any medications for this.  There is no sleep study on file.  Anemia: Her last H/H was 11.5/33.9, 10/2023.  She is not taking any iron at this time.  She follows with hematology.  Neutropenia: Her last WBC count was 2.9, 10/2023.  She follows with hematology.  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  She does not follow with GYN.  Review of Systems     Past Medical History:  Diagnosis Date   Acid reflux 10/19/2014   Arthritis    arms   Back pain, chronic 10/19/2014   GERD (gastroesophageal reflux disease)    Hyperlipidemia    Hypertension    Insomnia, persistent 10/19/2014    Current Outpatient Medications  Medication Sig Dispense Refill   aspirin  EC 81 MG tablet Take 81 mg by mouth daily. Swallow whole.     atorvastatin  (LIPITOR) 40 MG tablet TAKE 1 TABLET BY MOUTH DAILY 90 tablet 1   conjugated estrogens  (PREMARIN ) vaginal cream Apply 0.5mg  (pea-sized amount)  just inside the vaginal introitus with a finger-tip on  Monday, Wednesday and Friday nights. 30 g 5    hydrochlorothiazide  (HYDRODIURIL ) 12.5 MG tablet TAKE 1 TABLET BY MOUTH DAILY 90 tablet 1   losartan  (COZAAR ) 50 MG tablet TAKE 1 TABLET BY MOUTH DAILY 90 tablet 1   omeprazole  (PRILOSEC) 40 MG capsule TAKE 1 CAPSULE BY MOUTH IN THE  MORNING AND AT BEDTIME 200 capsule 0   No current facility-administered medications for this visit.    No Known Allergies  Family History  Problem Relation Age of Onset   Diabetes Father        in her 36 and 68s   Hypertension Daughter        her 55s   Kidney cancer Neg Hx    Bladder Cancer Neg Hx     Social History   Socioeconomic History   Marital status: Single    Spouse name: Not on file   Number of children: Not on file   Years of education: Not on file   Highest education level: 12th grade  Occupational History   Occupation: retired  Tobacco Use   Smoking status: Never   Smokeless tobacco: Never  Vaping Use   Vaping status: Never Used  Substance and Sexual Activity   Alcohol use: No    Alcohol/week: 0.0 standard drinks of alcohol   Drug use: No   Sexual activity: Not Currently  Other Topics Concern   Not on file  Social History Narrative   ** Merged History  Encounter **       Social Drivers of Health   Financial Resource Strain: Low Risk  (12/21/2023)   Overall Financial Resource Strain (CARDIA)    Difficulty of Paying Living Expenses: Not hard at all  Food Insecurity: No Food Insecurity (12/21/2023)   Hunger Vital Sign    Worried About Running Out of Food in the Last Year: Never true    Ran Out of Food in the Last Year: Never true  Transportation Needs: No Transportation Needs (12/21/2023)   PRAPARE - Administrator, Civil Service (Medical): No    Lack of Transportation (Non-Medical): No  Physical Activity: Inactive (12/21/2023)   Exercise Vital Sign    Days of Exercise per Week: 0 days    Minutes of Exercise per Session: 0 min  Stress: No Stress Concern Present (12/21/2023)   [employer] of Occupational Health  - Occupational Stress Questionnaire    Feeling of Stress: Not at all  Social Connections: Moderately Integrated (12/21/2023)   Social Connection and Isolation Panel    Frequency of Communication with Friends and Family: More than three times a week    Frequency of Social Gatherings with Friends and Family: More than three times a week    Attends Religious Services: More than 4 times per year    Active Member of [employer] or Organizations: Yes    Attends Banker Meetings: More than 4 times per year    Marital Status: Widowed  Intimate Partner Violence: Not At Risk (12/21/2023)   Humiliation, Afraid, Rape, and Kick questionnaire    Fear of Current or Ex-Partner: No    Emotionally Abused: No    Physically Abused: No    Sexually Abused: No     Constitutional: Denies fever, malaise, fatigue, headache or abrupt weight changes.  HEENT: Denies eye pain, eye redness, ear pain, ringing in the ears, wax buildup, runny nose, nasal congestion, bloody nose, or sore throat. Respiratory: Denies difficulty breathing, shortness of breath, cough or sputum production.   Cardiovascular: Denies chest pain, chest tightness, palpitations or swelling in the hands or feet.  Gastrointestinal: Pt reports reflux, epigastric pain. Denies bloating, constipation, diarrhea or blood in the stool.  GU: Denies urgency, frequency, pain with urination, burning sensation, blood in urine, odor or discharge. Musculoskeletal: Patient reports joint pain.  Denies decrease in range of motion, difficulty with gait, muscle pain or joint swelling.  Skin: Denies redness, rashes, lesions or ulcercations.  Neurological: Patient reports insomnia, paresthesia of left leg.  Denies dizziness, difficulty with memory, difficulty with speech or problems with balance and coordination.  Psych: Denies anxiety, depression, SI/HI.  No other specific complaints in a complete review of systems (except as listed in HPI above).  Objective:    BP 118/74 (BP Location: Right Arm, Patient Position: Sitting, Cuff Size: Normal)   Ht 4' 11 (1.499 m)   Wt 187 lb 12.8 oz (85.2 kg)   BMI 37.93 kg/m     Wt Readings from Last 3 Encounters:  12/21/23 183 lb (83 kg)  11/22/23 183 lb (83 kg)  10/27/23 183 lb 1.6 oz (83.1 kg)    General: Appears her stated age, obese in NAD. Skin: Warm, dry and intact.  HEENT: Head: normal shape and size; Eyes: sclera white, no icterus, conjunctiva pink, PERRLA and EOMs intact;  Cardiovascular: Normal rate and rhythm. S1,S2 noted.  No murmur, rubs or gallops noted. No JVD or BLE edema. No carotid bruits noted. Pulmonary/Chest: Normal effort and positive  vesicular breath sounds. No respiratory distress. No wheezes, rales or ronchi noted.  Abdomen: Soft and nontender.  Musculoskeletal: Normal flexion, rotation and lateral bending of the spine.  Decreased extension of the spine.  No bony tenderness noted over the lower lumbar spine.  Strength 5/5 RLE, 4/5 LLE.  No difficulty with gait.  Neurological: Alert and oriented. Positive SLR on the left at 45 degrees. Psychiatric: Mood and affect normal. Behavior is normal. Judgment and thought content normal.    BMET    Component Value Date/Time   NA 138 10/23/2023 0854   K 4.3 10/23/2023 0854   CL 101 10/23/2023 0854   CO2 29 10/23/2023 0854   GLUCOSE 96 10/23/2023 0854   BUN 13 10/23/2023 0854   BUN 14 01/04/2017 1622   CREATININE 0.64 10/23/2023 0854   CALCIUM  9.5 10/23/2023 0854   GFRNONAA 81 08/30/2019 0911   GFRAA 94 08/30/2019 0911    Lipid Panel     Component Value Date/Time   CHOL 164 10/23/2023 0854   TRIG 66 10/23/2023 0854   HDL 72 10/23/2023 0854   CHOLHDL 2.3 10/23/2023 0854   LDLCALC 77 10/23/2023 0854    CBC    Component Value Date/Time   WBC 2.9 (L) 10/27/2023 1146   RBC 3.59 (L) 10/27/2023 1147   RBC 3.62 (L) 10/27/2023 1146   HGB 11.5 (L) 10/27/2023 1146   HCT 33.9 (L) 10/27/2023 1146   PLT 162 10/27/2023 1146    MCV 93.6 10/27/2023 1146   MCH 31.8 10/27/2023 1146   MCHC 33.9 10/27/2023 1146   RDW 11.9 10/27/2023 1146   LYMPHSABS 1.2 10/27/2023 1146   MONOABS 0.3 10/27/2023 1146   EOSABS 0.0 10/27/2023 1146   BASOSABS 0.0 10/27/2023 1146    Hgb A1C Lab Results  Component Value Date   HGBA1C 5.1 10/23/2023            Assessment & Plan:   Paresthesia of left leg, concerning for lumbar radiculopathy:  Will obtain x-ray lumbar spine today Consider PT versus MRI for further evaluation if symptoms persist or worsen Rx for gabapentin 100 mg at bedtime-sedation caution given  RTC in 6 months for your annual exam Angeline Laura, NP

## 2024-04-25 ENCOUNTER — Encounter: Payer: Self-pay | Admitting: Internal Medicine

## 2024-04-25 ENCOUNTER — Ambulatory Visit
Admission: RE | Admit: 2024-04-25 | Discharge: 2024-04-25 | Disposition: A | Discharge status: Home / Self Care | Source: Ambulatory Visit | Attending: Internal Medicine | Admitting: Internal Medicine

## 2024-04-25 ENCOUNTER — Ambulatory Visit: Admitting: Internal Medicine

## 2024-04-25 VITALS — BP 118/74 | Ht 59.0 in | Wt 187.8 lb

## 2024-04-25 DIAGNOSIS — K219 Gastro-esophageal reflux disease without esophagitis: Secondary | ICD-10-CM | POA: Diagnosis not present

## 2024-04-25 DIAGNOSIS — E66812 Obesity, class 2: Secondary | ICD-10-CM

## 2024-04-25 DIAGNOSIS — E7800 Pure hypercholesterolemia, unspecified: Secondary | ICD-10-CM | POA: Diagnosis not present

## 2024-04-25 DIAGNOSIS — I700 Atherosclerosis of aorta: Secondary | ICD-10-CM

## 2024-04-25 DIAGNOSIS — Z23 Encounter for immunization: Secondary | ICD-10-CM | POA: Diagnosis not present

## 2024-04-25 DIAGNOSIS — M5416 Radiculopathy, lumbar region: Secondary | ICD-10-CM | POA: Insufficient documentation

## 2024-04-25 DIAGNOSIS — D649 Anemia, unspecified: Secondary | ICD-10-CM

## 2024-04-25 DIAGNOSIS — D700 Congenital agranulocytosis: Secondary | ICD-10-CM

## 2024-04-25 DIAGNOSIS — N952 Postmenopausal atrophic vaginitis: Secondary | ICD-10-CM | POA: Insufficient documentation

## 2024-04-25 DIAGNOSIS — I10 Essential (primary) hypertension: Secondary | ICD-10-CM | POA: Diagnosis not present

## 2024-04-25 DIAGNOSIS — M150 Primary generalized (osteo)arthritis: Secondary | ICD-10-CM

## 2024-04-25 DIAGNOSIS — G4700 Insomnia, unspecified: Secondary | ICD-10-CM

## 2024-04-25 DIAGNOSIS — R739 Hyperglycemia, unspecified: Secondary | ICD-10-CM

## 2024-04-25 NOTE — Patient Instructions (Signed)

## 2024-04-25 NOTE — Assessment & Plan Note (Signed)
 Will start gabapentin 100 mg at bedtime to see if this helps with insomnia as she feels like this is mostly related to pain at this time We will monitor

## 2024-04-25 NOTE — Assessment & Plan Note (Signed)
 Complicated by morbid obesity Encourage weight loss as this can help reduce reflux symptoms Advised her to try to identify and avoid foods that trigger her reflux She will try to decrease omeprazole  to 40 mg daily

## 2024-04-25 NOTE — Assessment & Plan Note (Signed)
 Complicated by morbid obesity Encouraged her to consume a low-fat diet Continue atorvastatin  40 mg and aspirin  81 mg daily C-Met and lipid profile today

## 2024-04-25 NOTE — Assessment & Plan Note (Signed)
 Encouraged diet and exercise for weight loss ?

## 2024-04-25 NOTE — Assessment & Plan Note (Signed)
 CBC today Not currently taking any oral iron She will continue to follow with hematology

## 2024-04-25 NOTE — Assessment & Plan Note (Signed)
 Complicated by morbid obesity Encourage weight loss as this can help reduce joint pain Okay to take tylenol  OTC as needed

## 2024-04-25 NOTE — Assessment & Plan Note (Signed)
 Continue premarin  cream 3 times weekly

## 2024-04-25 NOTE — Assessment & Plan Note (Signed)
 Complicated by morbid obesity C-Met and lipid profile today Encouraged her to consume low-fat diet Continue atorvastatin  40 mg daily

## 2024-04-25 NOTE — Assessment & Plan Note (Signed)
CBC today She will continue to follow with hematology

## 2024-04-25 NOTE — Assessment & Plan Note (Signed)
 Complicated by morbid obesity Continue losartan  50 mg and HCTZ 12.5 mg daily Reinforced DASH diet and exercise for weight loss C-Met today

## 2024-04-26 ENCOUNTER — Ambulatory Visit: Payer: Self-pay | Admitting: Internal Medicine

## 2024-04-26 LAB — COMPREHENSIVE METABOLIC PANEL WITH GFR
AG Ratio: 1.5 (calc) (ref 1.0–2.5)
ALT: 23 U/L (ref 6–29)
AST: 28 U/L (ref 10–35)
Albumin: 4.6 g/dL (ref 3.6–5.1)
Alkaline phosphatase (APISO): 57 U/L (ref 37–153)
BUN: 14 mg/dL (ref 7–25)
CO2: 30 mmol/L (ref 20–32)
Calcium: 9.6 mg/dL (ref 8.6–10.4)
Chloride: 100 mmol/L (ref 98–110)
Creat: 0.78 mg/dL (ref 0.60–0.95)
Globulin: 3 g/dL (ref 1.9–3.7)
Glucose, Bld: 90 mg/dL (ref 65–99)
Potassium: 4.4 mmol/L (ref 3.5–5.3)
Sodium: 139 mmol/L (ref 135–146)
Total Bilirubin: 0.7 mg/dL (ref 0.2–1.2)
Total Protein: 7.6 g/dL (ref 6.1–8.1)
eGFR: 76 mL/min/1.73m2 (ref >=60)

## 2024-04-26 LAB — LIPID PANEL
Cholesterol: 147 mg/dL (ref <200)
HDL: 71 mg/dL (ref >=50)
LDL Cholesterol (Calc): 59 mg/dL
Non-HDL Cholesterol (Calc): 76 mg/dL (ref <130)
Total CHOL/HDL Ratio: 2.1 (calc) (ref <5.0)
Triglycerides: 88 mg/dL (ref <150)

## 2024-04-26 LAB — CBC
HCT: 35.4 % — ABNORMAL LOW (ref 35.9–46.0)
Hemoglobin: 11.8 g/dL (ref 11.7–15.5)
MCH: 31.5 pg (ref 27.0–33.0)
MCHC: 33.3 g/dL (ref 31.6–35.4)
MCV: 94.4 fL (ref 81.4–101.7)
MPV: 10.7 fL (ref 7.5–12.5)
Platelets: 166 Thousand/uL (ref 140–400)
RBC: 3.75 Million/uL — ABNORMAL LOW (ref 3.80–5.10)
RDW: 12.1 % (ref 11.0–15.0)
WBC: 3.3 Thousand/uL — ABNORMAL LOW (ref 3.8–10.8)

## 2024-04-26 LAB — HEMOGLOBIN A1C
Hgb A1c MFr Bld: 5.1 % (ref <5.7)
Mean Plasma Glucose: 100 mg/dL
eAG (mmol/L): 5.5 mmol/L

## 2024-04-26 NOTE — Telephone Encounter (Signed)
 OV 04/25/24 Requested Prescriptions  Pending Prescriptions Disp Refills   losartan  (COZAAR ) 50 MG tablet [Pharmacy Med Name: Losartan  Potassium 50 MG Oral Tablet] 80 tablet 3    Sig: TAKE 1 TABLET BY MOUTH DAILY     Cardiovascular:  Angiotensin Receptor Blockers Passed - 04/26/2024  1:14 PM      Passed - Cr in normal range and within 180 days    Creat  Date Value Ref Range Status  04/25/2024 0.78 0.60 - 0.95 mg/dL Final         Passed - K in normal range and within 180 days    Potassium  Date Value Ref Range Status  04/25/2024 4.4 3.5 - 5.3 mmol/L Final         Passed - Patient is not pregnant      Passed - Last BP in normal range    BP Readings from Last 1 Encounters:  04/25/24 118/74         Passed - Valid encounter within last 6 months    Recent Outpatient Visits           Yesterday Lumbar radiculopathy   [facility] [place], Angeline ORN, NP   6 months ago Encounter for general adult medical examination with abnormal findings   [place] [facility] [place], Angeline ORN, NP               hydrochlorothiazide  (HYDRODIURIL ) 12.5 MG tablet [Pharmacy Med Name: hydroCHLOROthiazide  12.5 MG Oral Tablet] 80 tablet 3    Sig: TAKE 1 TABLET BY MOUTH DAILY     Cardiovascular: Diuretics - Thiazide Passed - 04/26/2024  1:14 PM      Passed - Cr in normal range and within 180 days    Creat  Date Value Ref Range Status  04/25/2024 0.78 0.60 - 0.95 mg/dL Final         Passed - K in normal range and within 180 days    Potassium  Date Value Ref Range Status  04/25/2024 4.4 3.5 - 5.3 mmol/L Final         Passed - Na in normal range and within 180 days    Sodium  Date Value Ref Range Status  04/25/2024 139 135 - 146 mmol/L Final         Passed - Last BP in normal range    BP Readings from Last 1 Encounters:  04/25/24 118/74         Passed - Valid encounter within last 6 months    Recent Outpatient Visits           Yesterday  Lumbar radiculopathy   [place] [facility] [place], Angeline ORN, NP   6 months ago Encounter for general adult medical examination with abnormal findings   [place] [facility] [place], [place] W, NP               atorvastatin  (LIPITOR) 40 MG tablet [Pharmacy Med Name: Atorvastatin  Calcium  40 MG Oral Tablet] 100 tablet 2    Sig: TAKE 1 TABLET BY MOUTH DAILY     Cardiovascular:  Antilipid - Statins Failed - 04/26/2024  1:14 PM      Failed - Lipid Panel in normal range within the last 12 months    Cholesterol  Date Value Ref Range Status  04/25/2024 147 <200 mg/dL Final   LDL Cholesterol (Calc)  Date Value Ref Range Status  04/25/2024 59 mg/dL (calc) Final  Comment:    Reference range: <100 . Desirable range <100 mg/dL for primary prevention;   <70 mg/dL for patients with CHD or diabetic patients  with > or = 2 CHD risk factors. SABRA LDL-C is now calculated using the Martin-Hopkins  calculation, which is a validated novel method providing  better accuracy than the Friedewald equation in the  estimation of LDL-C.  Gladis APPLETHWAITE et al. SANDREA. 7986;689(80): 2061-2068  (http://education.[facility].com/faq/FAQ164)    HDL  Date Value Ref Range Status  04/25/2024 71 > OR = 50 mg/dL Final   Triglycerides  Date Value Ref Range Status  04/25/2024 88 <150 mg/dL Final         Passed - Patient is not pregnant      Passed - Valid encounter within last 12 months    Recent Outpatient Visits           Yesterday Lumbar radiculopathy   [place] [facility] [place], Angeline ORN, NP   6 months ago Encounter for general adult medical examination with abnormal findings   [place] [facility] [place], Angeline ORN, NP

## 2024-06-03 ENCOUNTER — Inpatient Hospital Stay: Attending: Internal Medicine

## 2024-06-03 ENCOUNTER — Inpatient Hospital Stay: Admitting: Oncology

## 2024-06-03 ENCOUNTER — Telehealth: Payer: Self-pay | Admitting: Oncology

## 2024-06-03 NOTE — Telephone Encounter (Signed)
 Called pt to r/s missed appt - pt hung up on me - will try to call again to r/s - [facility]

## 2024-06-03 NOTE — Assessment & Plan Note (Signed)
 Workup results were reviewed with patient. Negative hepatitis, HIV, negative peripheral blood flow cytometry.  Normal B12 and folate. 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  She is asymptomatic. Continue observation.

## 2024-10-24 ENCOUNTER — Encounter: Admitting: Internal Medicine

## 2025-01-03 ENCOUNTER — Ambulatory Visit

## 2025-01-08 ENCOUNTER — Ambulatory Visit
